# Patient Record
Sex: Female | Born: 1975 | Race: Black or African American | Hispanic: No | Marital: Single | Smoking: Never smoker
Health system: Southern US, Community
[De-identification: ages and names within clinical notes are randomized; demographics above are authoritative.]

## PROBLEM LIST (undated history)

## (undated) DIAGNOSIS — F32A Depression, unspecified: Secondary | ICD-10-CM

## (undated) DIAGNOSIS — Z9889 Other specified postprocedural states: Secondary | ICD-10-CM

## (undated) DIAGNOSIS — M2540 Effusion, unspecified joint: Secondary | ICD-10-CM

## (undated) DIAGNOSIS — F419 Anxiety disorder, unspecified: Secondary | ICD-10-CM

## (undated) DIAGNOSIS — R112 Nausea with vomiting, unspecified: Secondary | ICD-10-CM

## (undated) DIAGNOSIS — Z96652 Presence of left artificial knee joint: Secondary | ICD-10-CM

## (undated) DIAGNOSIS — C801 Malignant (primary) neoplasm, unspecified: Secondary | ICD-10-CM

## (undated) DIAGNOSIS — M1990 Unspecified osteoarthritis, unspecified site: Secondary | ICD-10-CM

## (undated) DIAGNOSIS — R519 Headache, unspecified: Secondary | ICD-10-CM

## (undated) DIAGNOSIS — Z8669 Personal history of other diseases of the nervous system and sense organs: Secondary | ICD-10-CM

## (undated) DIAGNOSIS — D508 Other iron deficiency anemias: Secondary | ICD-10-CM

## (undated) DIAGNOSIS — Z9884 Bariatric surgery status: Secondary | ICD-10-CM

## (undated) DIAGNOSIS — F329 Major depressive disorder, single episode, unspecified: Secondary | ICD-10-CM

## (undated) DIAGNOSIS — Z860100 Personal history of colon polyps, unspecified: Secondary | ICD-10-CM

## (undated) DIAGNOSIS — M2550 Pain in unspecified joint: Secondary | ICD-10-CM

## (undated) DIAGNOSIS — E119 Type 2 diabetes mellitus without complications: Secondary | ICD-10-CM

## (undated) HISTORY — PX: OTHER SURGICAL HISTORY: SHX169

## (undated) HISTORY — PX: APPENDECTOMY: SHX54

## (undated) HISTORY — PX: KNEE ARTHROSCOPY: SUR90

## (undated) HISTORY — PX: CHOLECYSTECTOMY: SHX55

## (undated) HISTORY — PX: COLONOSCOPY: SHX174

## (undated) HISTORY — PX: ARTHROSCOPIC REPAIR ACL: SUR80

## (undated) HISTORY — DX: Other iron deficiency anemias: D50.8

## (undated) HISTORY — DX: Bariatric surgery status: Z98.84

## (undated) HISTORY — PX: GASTRIC BYPASS: SHX52

## (undated) HISTORY — PX: COLECTOMY: SHX59

---

## 2010-10-23 ENCOUNTER — Other Ambulatory Visit (HOSPITAL_BASED_OUTPATIENT_CLINIC_OR_DEPARTMENT_OTHER): Payer: Self-pay | Admitting: Internal Medicine

## 2010-10-23 DIAGNOSIS — R945 Abnormal results of liver function studies: Secondary | ICD-10-CM

## 2010-10-26 ENCOUNTER — Ambulatory Visit (HOSPITAL_BASED_OUTPATIENT_CLINIC_OR_DEPARTMENT_OTHER)
Admission: RE | Admit: 2010-10-26 | Discharge: 2010-10-26 | Disposition: A | Discharge status: Home / Self Care | Payer: Private Health Insurance - Indemnity | Source: Ambulatory Visit | Attending: Internal Medicine | Admitting: Internal Medicine

## 2010-10-26 DIAGNOSIS — R945 Abnormal results of liver function studies: Secondary | ICD-10-CM

## 2010-10-26 DIAGNOSIS — R42 Dizziness and giddiness: Secondary | ICD-10-CM | POA: Insufficient documentation

## 2010-10-26 DIAGNOSIS — Z9884 Bariatric surgery status: Secondary | ICD-10-CM | POA: Insufficient documentation

## 2010-10-26 DIAGNOSIS — K8020 Calculus of gallbladder without cholecystitis without obstruction: Secondary | ICD-10-CM | POA: Insufficient documentation

## 2012-10-26 ENCOUNTER — Other Ambulatory Visit (HOSPITAL_BASED_OUTPATIENT_CLINIC_OR_DEPARTMENT_OTHER): Payer: Self-pay | Admitting: Internal Medicine

## 2012-10-26 DIAGNOSIS — Z1231 Encounter for screening mammogram for malignant neoplasm of breast: Secondary | ICD-10-CM

## 2012-10-27 ENCOUNTER — Ambulatory Visit (HOSPITAL_BASED_OUTPATIENT_CLINIC_OR_DEPARTMENT_OTHER)
Admission: RE | Admit: 2012-10-27 | Discharge: 2012-10-27 | Disposition: A | Discharge status: Home / Self Care | Payer: Private Health Insurance - Indemnity | Source: Ambulatory Visit | Attending: Internal Medicine | Admitting: Internal Medicine

## 2012-10-27 DIAGNOSIS — Z1231 Encounter for screening mammogram for malignant neoplasm of breast: Secondary | ICD-10-CM | POA: Insufficient documentation

## 2013-01-24 ENCOUNTER — Telehealth: Payer: Self-pay | Admitting: Hematology & Oncology

## 2013-01-24 NOTE — Telephone Encounter (Signed)
Left pt message to call and schedule appointment °

## 2013-01-24 NOTE — Telephone Encounter (Signed)
Pt aware of 8-25 appointment she said she couldn't come 8-18. Dr. Myna Hidalgo aware. Left message for Delice Bison to precert iron.

## 2013-01-24 NOTE — Telephone Encounter (Signed)
REFERRAL FAXED TO [facility] @ [facility].  °

## 2013-01-26 ENCOUNTER — Telehealth: Payer: Self-pay | Admitting: Hematology & Oncology

## 2013-01-26 NOTE — Telephone Encounter (Signed)
Referral forwarded to [facility] @ [facility]

## 2013-02-05 ENCOUNTER — Ambulatory Visit (HOSPITAL_BASED_OUTPATIENT_CLINIC_OR_DEPARTMENT_OTHER): Payer: 59 | Admitting: Hematology & Oncology

## 2013-02-05 ENCOUNTER — Other Ambulatory Visit (HOSPITAL_BASED_OUTPATIENT_CLINIC_OR_DEPARTMENT_OTHER): Payer: 59 | Admitting: Lab

## 2013-02-05 ENCOUNTER — Ambulatory Visit (HOSPITAL_BASED_OUTPATIENT_CLINIC_OR_DEPARTMENT_OTHER): Payer: 59

## 2013-02-05 ENCOUNTER — Ambulatory Visit: Payer: Private Health Insurance - Indemnity

## 2013-02-05 ENCOUNTER — Encounter: Payer: Self-pay | Admitting: Hematology & Oncology

## 2013-02-05 VITALS — BP 121/74 | HR 80 | Temp 98.0°F | Resp 16 | Ht 68.0 in | Wt 209.0 lb

## 2013-02-05 DIAGNOSIS — Z9884 Bariatric surgery status: Secondary | ICD-10-CM

## 2013-02-05 DIAGNOSIS — D509 Iron deficiency anemia, unspecified: Secondary | ICD-10-CM

## 2013-02-05 DIAGNOSIS — D508 Other iron deficiency anemias: Secondary | ICD-10-CM | POA: Insufficient documentation

## 2013-02-05 DIAGNOSIS — Z85038 Personal history of other malignant neoplasm of large intestine: Secondary | ICD-10-CM

## 2013-02-05 HISTORY — DX: Other iron deficiency anemias: D50.8

## 2013-02-05 HISTORY — DX: Bariatric surgery status: Z98.84

## 2013-02-05 LAB — TECHNOLOGIST REVIEW CHCC SATELLITE

## 2013-02-05 LAB — CBC WITH DIFFERENTIAL (CANCER CENTER ONLY)
BASO#: 0 10*3/uL (ref 0.0–0.2)
BASO%: 0.6 % (ref 0.0–2.0)
EOS%: 2.1 % (ref 0.0–7.0)
Eosinophils Absolute: 0.1 10*3/uL (ref 0.0–0.5)
HCT: 24 % — ABNORMAL LOW (ref 34.8–46.6)
HGB: 6.6 g/dL — CL (ref 11.6–15.9)
LYMPH#: 2 10*3/uL (ref 0.9–3.3)
LYMPH%: 38.1 % (ref 14.0–48.0)
MCH: 16.5 pg — ABNORMAL LOW (ref 26.0–34.0)
MCHC: 27.5 g/dL — ABNORMAL LOW (ref 32.0–36.0)
MCV: 60 fL — ABNORMAL LOW (ref 81–101)
MONO#: 0.4 10*3/uL (ref 0.1–0.9)
MONO%: 7.8 % (ref 0.0–13.0)
NEUT#: 2.6 10*3/uL (ref 1.5–6.5)
NEUT%: 51.4 % (ref 39.6–80.0)
Platelets: 283 10*3/uL (ref 145–400)
RBC: 3.99 10*6/uL (ref 3.70–5.32)
RDW: 21.5 % — ABNORMAL HIGH (ref 11.1–15.7)
WBC: 5.1 10*3/uL (ref 3.9–10.0)

## 2013-02-05 LAB — CHCC SATELLITE - SMEAR

## 2013-02-05 MED ORDER — FERUMOXYTOL INJECTION 510 MG/17 ML
1020.0000 mg | Freq: Once | INTRAVENOUS | Status: AC
Start: 2013-02-05 — End: 2013-02-05
  Administered 2013-02-05: 1020 mg via INTRAVENOUS
  Filled 2013-02-05: qty 34

## 2013-02-05 MED ORDER — SODIUM CHLORIDE 0.9 % IV SOLN
Freq: Once | INTRAVENOUS | Status: AC
Start: 2013-02-05 — End: 2013-02-05
  Administered 2013-02-05: 14:00:00 via INTRAVENOUS

## 2013-02-05 NOTE — Patient Instructions (Addendum)
Ferumoxytol injection What is this medicine? FERUMOXYTOL is an iron complex. Iron is used to make healthy red blood cells, which carry oxygen and nutrients throughout the body. This medicine is used to treat iron deficiency anemia in people with chronic kidney disease. This medicine may be used for other purposes; ask your health care provider or pharmacist if you have questions. What should I tell my health care provider before I take this medicine? They need to know if you have any of these conditions: -anemia not caused by low iron levels -high levels of iron in the blood -magnetic resonance imaging (MRI) test scheduled -an unusual or allergic reaction to iron, other medicines, foods, dyes, or preservatives -pregnant or trying to get pregnant -breast-feeding How should I use this medicine? This medicine is for infusion into a vein. It is given by a health care professional in a hospital or clinic setting. Talk to your pediatrician regarding the use of this medicine in children. Special care may be needed. Overdosage: If you think you've taken too much of this medicine contact a poison control center or emergency room at once. Overdosage: If you think you have taken too much of this medicine contact a poison control center or emergency room at once. NOTE: This medicine is only for you. Do not share this medicine with others. What if I miss a dose? It is important not to miss your dose. Call your doctor or health care professional if you are unable to keep an appointment. What may interact with this medicine? This medicine may interact with the following medications: -other iron products This list may not describe all possible interactions. Give your health care provider a list of all the medicines, herbs, non-prescription drugs, or dietary supplements you use. Also tell them if you smoke, drink alcohol, or use illegal drugs. Some items may interact with your medicine. What should I watch  for while using this medicine? Visit your doctor or healthcare professional regularly. Tell your doctor or healthcare professional if your symptoms do not start to get better or if they get worse. You may need blood work done while you are taking this medicine. You may need to follow a special diet. Talk to your doctor. Foods that contain iron include: whole grains/cereals, dried fruits, beans, or peas, leafy green vegetables, and organ meats (liver, kidney). What side effects may I notice from receiving this medicine? Side effects that you should report to your doctor or health care professional as soon as possible: -allergic reactions like skin rash, itching or hives, swelling of the face, lips, or tongue -breathing problems -changes in blood pressure -feeling faint or lightheaded, falls -fever or chills -flushing, sweating, or hot feelings -swelling of the ankles or feet Side effects that usually do not require medical attention (Report these to your doctor or health care professional if they continue or are bothersome.): -diarrhea -headache -nausea, vomiting -stomach pain This list may not describe all possible side effects. Call your doctor for medical advice about side effects. You may report side effects to FDA at 1-800-FDA-1088. Where should I keep my medicine? This drug is given in a hospital or clinic and will not be stored at home. NOTE: This sheet is a summary. It may not cover all possible information. If you have questions about this medicine, talk to your doctor, pharmacist, or health care provider.  2013, [employer]vier/Gold Standard. (02/21/2008 9:48:25 PM)  

## 2013-02-05 NOTE — Progress Notes (Signed)
This office note has been dictated.

## 2013-02-06 NOTE — Progress Notes (Signed)
CC:   Brandi Lutz, M.D.  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  HISTORY OF PRESENT ILLNESS:  Brandi Lutz is a very charming 37 year old African American female.  [name] actually from [place].  She only lived in [place] for 5 years.  She then moved to [place].  She went to [place] state.  She then moved down to [place] and then eventually up to [place].  She works for [employer].  She had a gastric bypass 5 years ago.  She apparently weighed over 370 pounds.  She has lost quite a bit of weight.  [name] incredibly diligent with making sure that she keeps the weight off.  [name] getting B12 injections monthly.  She was noted to have anemia.  Recently, she had lab work done which showed a white cell count of 4.5, hemoglobin 6.4, hematocrit 23.4, platelet count 458.  MCV was 59.  She had normal electrolytes.  She had a homocystine level that was drawn, which was 11.6.  She was kindly referred to the [facility] for IV iron.  She feels tired.  She chews ice quite a bit.  [name] still working however, it has been tougher for her to get through work because of her fatigue.  He says that her muscles wear out pretty easily.  She has had no bleeding.  There has been no melena or bright red blood per rectum.  She does have her monthly cycles but they do not seem to be heavy.  Of note, she has had her gallbladder taken out.  Again, we were asked to see her to see about IV iron to help improve her anemia.  PAST MEDICAL HISTORY: 1. Colon cancer.  There is a strong family history of colon cancer.     She had stage II colon cancer that was resected, I think, back in     2007.  She did not require any adjuvant chemotherapy. 2. Morbid obesity, status post gastric bypass. 3. Cholecystectomy.  ALLERGIES:  None.  MEDICATIONS: 1. Oral iron 1 p.o. b.i.d. 2. Klonopin 0.5 mg p.o. daily. 3. Vitamin D 1 p.o. b.i.d.  FAMILY HISTORY:  Remarkable  for history of colon cancer on her mother's side of the family.  I think 3 family members have colon cancer.  REVIEW OF SYSTEMS:  As stated in the history of present illness.  No additional findings are noted on a 12-system review.  PHYSICAL EXAMINATION:  General:  This is a well-developed, well- nourished, African American female in no obvious distress.  Vital signs: Temperature of 98, pulse 80, respiratory rate 16, blood pressure 121/74. Weight is 209 pounds.  Head and neck:  Normocephalic, atraumatic skull. There are no ocular or oral lesions.  There are no palpable cervical or supraclavicular lymph nodes.  Lungs:  Clear to percussion and auscultation bilaterally.  Cardiac:  Regular rate and rhythm with a normal S1, S2.  There are no murmurs, rubs or bruits.  Abdomen:  Soft. She has good bowel sounds.  There is no fluid wave.  She has well-healed laparoscopy scar from her colon cancer surgery.  There is no palpable hepatosplenomegaly.  Back:  No tenderness over the spine, ribs, or hips. Extremities:  Show no clubbing, cyanosis or edema.  She has good range motion of her joints.  Skin:  Shows no rashes, ecchymoses or petechia. Neurological:  Shows no focal neurological deficits.  LABORATORY STUDIES:  White cell count is 5.1, hemoglobin 6.6, hematocrit 24, platelet  count 283,000.  MCV is 60.  Peripheral smear shows markedly microcytic and hypochromic population of red blood cells.  There are no nucleated red blood cells.  I see no target cells.  There is no rouleaux formation.  I see no schistocytes. White cells appear normal in morphology and maturation.  There are no immature myeloid or lymphoid forms.  There are no hypersegmented polys. There are no atypical lymphocytes.  There are no blasts.  Platelets are adequate in number and size.  IMPRESSION:  Brandi Lutz is a very charming 37 year old [place] female who had gastric bypass.  She also had colon cancer.  She now is  iron deficient.  We did go ahead and give her a dose of IV iron today.  I am very confident that her iron stores are pretty much depleted and that she likely will need more than 1 dose of iron.  Her blood smear is highly consistent with iron deficiency.  Clinical symptoms appear to be consistent with iron deficiency.  I would like to see her back in about 4 weeks or so.  By then we should be seeing a nice rise in her MCV and also in her hemoglobin.  I spent a good hour or so with Brandi Lutz.  [name] very, very nice.  We certainly had a nice time talking.    ______________________________ Josph Macho, M.D. PRE/MEDQ  D:  02/05/2013  T:  02/06/2013  Job:  [id number]

## 2013-03-26 ENCOUNTER — Telehealth: Payer: Self-pay | Admitting: Hematology & Oncology

## 2013-03-26 NOTE — Telephone Encounter (Signed)
Pt called today to schedule another iron infusion, and advised Dr. Bea Laura said she needs three.

## 2013-04-09 ENCOUNTER — Encounter: Payer: Self-pay | Admitting: *Deleted

## 2013-04-12 ENCOUNTER — Ambulatory Visit (HOSPITAL_BASED_OUTPATIENT_CLINIC_OR_DEPARTMENT_OTHER): Payer: 59

## 2013-04-12 VITALS — BP 121/61 | HR 89 | Temp 98.2°F

## 2013-04-12 DIAGNOSIS — D508 Other iron deficiency anemias: Secondary | ICD-10-CM

## 2013-04-12 DIAGNOSIS — D509 Iron deficiency anemia, unspecified: Secondary | ICD-10-CM

## 2013-04-12 MED ORDER — SODIUM CHLORIDE 0.9 % IV SOLN
1020.0000 mg | Freq: Once | INTRAVENOUS | Status: AC
  Administered 2013-04-12: 1020 mg via INTRAVENOUS
  Filled 2013-04-12: qty 34

## 2013-04-12 MED ORDER — SODIUM CHLORIDE 0.9 % IV SOLN
Freq: Once | INTRAVENOUS | Status: AC
  Administered 2013-04-12: 13:00:00 via INTRAVENOUS

## 2013-04-12 NOTE — Patient Instructions (Signed)

## 2013-04-19 ENCOUNTER — Encounter: Payer: Self-pay | Admitting: Hematology & Oncology

## 2013-11-13 ENCOUNTER — Ambulatory Visit: Payer: 59 | Attending: Orthopaedic Surgery | Admitting: Rehabilitation

## 2013-11-13 DIAGNOSIS — Z9889 Other specified postprocedural states: Secondary | ICD-10-CM | POA: Insufficient documentation

## 2013-11-13 DIAGNOSIS — M25569 Pain in unspecified knee: Secondary | ICD-10-CM | POA: Diagnosis not present

## 2013-11-13 DIAGNOSIS — IMO0001 Reserved for inherently not codable concepts without codable children: Secondary | ICD-10-CM | POA: Insufficient documentation

## 2013-11-13 DIAGNOSIS — M898X9 Other specified disorders of bone, unspecified site: Secondary | ICD-10-CM | POA: Insufficient documentation

## 2013-11-13 DIAGNOSIS — M2350 Chronic instability of knee, unspecified knee: Secondary | ICD-10-CM | POA: Diagnosis not present

## 2013-11-13 DIAGNOSIS — M1710 Unilateral primary osteoarthritis, unspecified knee: Secondary | ICD-10-CM | POA: Insufficient documentation

## 2013-11-16 ENCOUNTER — Ambulatory Visit: Payer: 59 | Admitting: Rehabilitation

## 2013-11-16 DIAGNOSIS — IMO0001 Reserved for inherently not codable concepts without codable children: Secondary | ICD-10-CM | POA: Diagnosis not present

## 2013-11-20 ENCOUNTER — Ambulatory Visit: Payer: 59 | Admitting: Rehabilitation

## 2013-11-21 ENCOUNTER — Ambulatory Visit: Payer: 59 | Admitting: Rehabilitation

## 2013-11-21 DIAGNOSIS — IMO0001 Reserved for inherently not codable concepts without codable children: Secondary | ICD-10-CM | POA: Diagnosis not present

## 2013-11-26 ENCOUNTER — Ambulatory Visit: Payer: 59 | Admitting: Rehabilitation

## 2013-11-26 ENCOUNTER — Other Ambulatory Visit: Payer: Self-pay | Admitting: Orthopaedic Surgery

## 2013-11-26 DIAGNOSIS — M25562 Pain in left knee: Secondary | ICD-10-CM

## 2013-11-29 ENCOUNTER — Ambulatory Visit: Payer: 59 | Admitting: Rehabilitation

## 2013-11-29 ENCOUNTER — Ambulatory Visit
Admission: RE | Admit: 2013-11-29 | Discharge: 2013-11-29 | Disposition: A | Discharge status: Home / Self Care | Payer: 59 | Source: Ambulatory Visit | Attending: Orthopaedic Surgery | Admitting: Orthopaedic Surgery

## 2013-11-29 DIAGNOSIS — M25562 Pain in left knee: Secondary | ICD-10-CM

## 2013-12-03 ENCOUNTER — Ambulatory Visit: Payer: 59 | Admitting: Rehabilitation

## 2013-12-06 ENCOUNTER — Ambulatory Visit: Payer: 59 | Admitting: Rehabilitation

## 2013-12-10 ENCOUNTER — Ambulatory Visit: Payer: 59 | Admitting: Rehabilitation

## 2013-12-13 ENCOUNTER — Ambulatory Visit: Payer: 59 | Admitting: Rehabilitation

## 2014-01-31 ENCOUNTER — Other Ambulatory Visit (HOSPITAL_BASED_OUTPATIENT_CLINIC_OR_DEPARTMENT_OTHER): Payer: Self-pay | Admitting: Internal Medicine

## 2014-01-31 DIAGNOSIS — Z1231 Encounter for screening mammogram for malignant neoplasm of breast: Secondary | ICD-10-CM

## 2014-02-13 ENCOUNTER — Ambulatory Visit (HOSPITAL_BASED_OUTPATIENT_CLINIC_OR_DEPARTMENT_OTHER)
Admission: RE | Admit: 2014-02-13 | Discharge: 2014-02-13 | Disposition: A | Discharge status: Home / Self Care | Payer: 59 | Source: Ambulatory Visit | Attending: Internal Medicine | Admitting: Internal Medicine

## 2014-02-13 DIAGNOSIS — Z1231 Encounter for screening mammogram for malignant neoplasm of breast: Secondary | ICD-10-CM | POA: Diagnosis not present

## 2014-02-13 DIAGNOSIS — R928 Other abnormal and inconclusive findings on diagnostic imaging of breast: Secondary | ICD-10-CM | POA: Diagnosis not present

## 2014-02-15 ENCOUNTER — Other Ambulatory Visit: Payer: Self-pay | Admitting: Internal Medicine

## 2014-02-15 DIAGNOSIS — R928 Other abnormal and inconclusive findings on diagnostic imaging of breast: Secondary | ICD-10-CM

## 2014-02-25 ENCOUNTER — Ambulatory Visit
Admission: RE | Admit: 2014-02-25 | Discharge: 2014-02-25 | Disposition: A | Discharge status: Home / Self Care | Payer: 59 | Source: Ambulatory Visit | Attending: Internal Medicine | Admitting: Internal Medicine

## 2014-02-25 ENCOUNTER — Encounter (INDEPENDENT_AMBULATORY_CARE_PROVIDER_SITE_OTHER): Payer: Self-pay

## 2014-02-25 DIAGNOSIS — R928 Other abnormal and inconclusive findings on diagnostic imaging of breast: Secondary | ICD-10-CM

## 2014-04-02 ENCOUNTER — Ambulatory Visit: Payer: 59 | Attending: Orthopaedic Surgery

## 2014-04-02 DIAGNOSIS — Z5189 Encounter for other specified aftercare: Secondary | ICD-10-CM | POA: Diagnosis not present

## 2014-04-02 DIAGNOSIS — M25569 Pain in unspecified knee: Secondary | ICD-10-CM | POA: Insufficient documentation

## 2014-04-03 ENCOUNTER — Ambulatory Visit: Payer: 59 | Admitting: Rehabilitation

## 2014-04-09 ENCOUNTER — Ambulatory Visit: Payer: 59

## 2014-04-09 DIAGNOSIS — Z5189 Encounter for other specified aftercare: Secondary | ICD-10-CM | POA: Diagnosis not present

## 2014-04-11 ENCOUNTER — Ambulatory Visit: Payer: 59 | Admitting: Rehabilitation

## 2014-04-11 DIAGNOSIS — Z5189 Encounter for other specified aftercare: Secondary | ICD-10-CM | POA: Diagnosis not present

## 2014-04-16 ENCOUNTER — Ambulatory Visit: Payer: 59 | Attending: Orthopaedic Surgery | Admitting: Rehabilitation

## 2014-04-16 DIAGNOSIS — Z5189 Encounter for other specified aftercare: Secondary | ICD-10-CM | POA: Insufficient documentation

## 2014-04-16 DIAGNOSIS — M25569 Pain in unspecified knee: Secondary | ICD-10-CM | POA: Diagnosis not present

## 2014-04-18 ENCOUNTER — Ambulatory Visit: Payer: 59

## 2014-04-18 DIAGNOSIS — Z5189 Encounter for other specified aftercare: Secondary | ICD-10-CM | POA: Diagnosis not present

## 2014-04-23 ENCOUNTER — Ambulatory Visit: Payer: 59 | Admitting: Rehabilitation

## 2014-04-23 DIAGNOSIS — Z5189 Encounter for other specified aftercare: Secondary | ICD-10-CM | POA: Diagnosis not present

## 2014-04-25 ENCOUNTER — Ambulatory Visit: Payer: 59

## 2014-04-30 ENCOUNTER — Ambulatory Visit: Payer: 59 | Admitting: Rehabilitation

## 2014-04-30 DIAGNOSIS — Z5189 Encounter for other specified aftercare: Secondary | ICD-10-CM | POA: Diagnosis not present

## 2014-05-02 ENCOUNTER — Ambulatory Visit: Payer: 59 | Admitting: Rehabilitation

## 2014-05-07 ENCOUNTER — Ambulatory Visit: Payer: 59 | Admitting: Rehabilitation

## 2014-05-07 DIAGNOSIS — Z5189 Encounter for other specified aftercare: Secondary | ICD-10-CM | POA: Diagnosis not present

## 2014-05-08 ENCOUNTER — Ambulatory Visit: Payer: 59 | Admitting: Rehabilitation

## 2014-05-08 DIAGNOSIS — Z5189 Encounter for other specified aftercare: Secondary | ICD-10-CM | POA: Diagnosis not present

## 2014-05-13 ENCOUNTER — Ambulatory Visit: Payer: 59 | Admitting: Rehabilitation

## 2014-05-16 ENCOUNTER — Ambulatory Visit: Payer: 59 | Admitting: Rehabilitation

## 2014-05-20 ENCOUNTER — Ambulatory Visit: Payer: 59 | Attending: Orthopaedic Surgery | Admitting: Rehabilitation

## 2014-05-20 DIAGNOSIS — M25569 Pain in unspecified knee: Secondary | ICD-10-CM | POA: Insufficient documentation

## 2014-05-20 DIAGNOSIS — Z5189 Encounter for other specified aftercare: Secondary | ICD-10-CM | POA: Insufficient documentation

## 2014-05-23 ENCOUNTER — Encounter: Payer: 59 | Admitting: Rehabilitation

## 2014-05-27 ENCOUNTER — Ambulatory Visit: Payer: 59 | Admitting: Rehabilitation

## 2014-05-29 ENCOUNTER — Ambulatory Visit: Payer: 59 | Admitting: Rehabilitation

## 2014-07-16 ENCOUNTER — Inpatient Hospital Stay (HOSPITAL_COMMUNITY): Admission: RE | Admit: 2014-07-16 | Payer: Self-pay | Source: Ambulatory Visit

## 2014-07-22 ENCOUNTER — Encounter (HOSPITAL_COMMUNITY): Payer: Self-pay

## 2014-07-22 ENCOUNTER — Encounter (HOSPITAL_COMMUNITY)
Admission: RE | Admit: 2014-07-22 | Discharge: 2014-07-22 | Disposition: A | Discharge status: Home / Self Care | Payer: 59 | Source: Ambulatory Visit | Attending: Orthopaedic Surgery | Admitting: Orthopaedic Surgery

## 2014-07-22 DIAGNOSIS — M179 Osteoarthritis of knee, unspecified: Secondary | ICD-10-CM | POA: Diagnosis not present

## 2014-07-22 DIAGNOSIS — Z01812 Encounter for preprocedural laboratory examination: Secondary | ICD-10-CM | POA: Insufficient documentation

## 2014-07-22 DIAGNOSIS — Z0183 Encounter for blood typing: Secondary | ICD-10-CM | POA: Diagnosis not present

## 2014-07-22 HISTORY — DX: Personal history of colon polyps, unspecified: Z86.0100

## 2014-07-22 HISTORY — DX: Unspecified osteoarthritis, unspecified site: M19.90

## 2014-07-22 HISTORY — DX: Personal history of colonic polyps: Z86.010

## 2014-07-22 HISTORY — DX: Other specified postprocedural states: Z98.890

## 2014-07-22 HISTORY — DX: Major depressive disorder, single episode, unspecified: F32.9

## 2014-07-22 HISTORY — DX: Anxiety disorder, unspecified: F41.9

## 2014-07-22 HISTORY — DX: Personal history of other diseases of the nervous system and sense organs: Z86.69

## 2014-07-22 HISTORY — DX: Nausea with vomiting, unspecified: R11.2

## 2014-07-22 HISTORY — DX: Depression, unspecified: F32.A

## 2014-07-22 HISTORY — DX: Pain in unspecified joint: M25.50

## 2014-07-22 HISTORY — DX: Effusion, unspecified joint: M25.40

## 2014-07-22 HISTORY — DX: Malignant (primary) neoplasm, unspecified: C80.1

## 2014-07-22 LAB — PROTIME-INR
INR: 1.07 (ref 0.00–1.49)
Prothrombin Time: 14.1 seconds (ref 11.6–15.2)

## 2014-07-22 LAB — BASIC METABOLIC PANEL
Anion gap: 5 (ref 5–15)
BUN: 10 mg/dL (ref 6–23)
CO2: 25 mmol/L (ref 19–32)
Calcium: 8.6 mg/dL (ref 8.4–10.5)
Chloride: 108 mmol/L (ref 96–112)
Creatinine, Ser: 0.92 mg/dL (ref 0.50–1.10)
GFR calc Af Amer: 89 mL/min — ABNORMAL LOW (ref >90)
GFR calc non Af Amer: 77 mL/min — ABNORMAL LOW (ref >90)
Glucose, Bld: 73 mg/dL (ref 70–99)
Potassium: 3.6 mmol/L (ref 3.5–5.1)
Sodium: 138 mmol/L (ref 135–145)

## 2014-07-22 LAB — APTT: aPTT: 31 seconds (ref 24–37)

## 2014-07-22 LAB — HCG, SERUM, QUALITATIVE: Preg, Serum: NEGATIVE

## 2014-07-22 LAB — CBC
HCT: 32.7 % — ABNORMAL LOW (ref 36.0–46.0)
Hemoglobin: 10.6 g/dL — ABNORMAL LOW (ref 12.0–15.0)
MCH: 23.5 pg — ABNORMAL LOW (ref 26.0–34.0)
MCHC: 32.4 g/dL (ref 30.0–36.0)
MCV: 72.3 fL — ABNORMAL LOW (ref 78.0–100.0)
Platelets: 274 10*3/uL (ref 150–400)
RBC: 4.52 MIL/uL (ref 3.87–5.11)
RDW: 14.7 % (ref 11.5–15.5)
WBC: 4.9 10*3/uL (ref 4.0–10.5)

## 2014-07-22 LAB — SURGICAL PCR SCREEN
MRSA, PCR: NEGATIVE
Staphylococcus aureus: NEGATIVE

## 2014-07-22 LAB — ABO/RH: ABO/RH(D): O POS

## 2014-07-22 LAB — TYPE AND SCREEN
ABO/RH(D): O POS
Antibody Screen: NEGATIVE

## 2014-07-22 NOTE — Pre-Procedure Instructions (Signed)
Brandi Lutz  07/22/2014   Your procedure is scheduled on:  Mon, Feb 22 @ 12:30 PM  Report to [facility]  at 10:30 AM.  Call this number if you have problems the morning of surgery: [phone]   Remember:   Do not eat food or drink liquids after midnight.   Take these medicines the morning of surgery with A SIP OF WATER: Wellbutrin(Bupropion) and Zoloft(Sertraline)               No Goody's,BC's,Aleve,Aspirin,Ibuprofen,Fish Oil,or any Herbal Medications   Do not wear jewelry, make-up or nail polish.  Do not wear lotions, powders, or perfumes. You may wear deodorant.  Do not shave 48 hours prior to surgery.   Do not bring valuables to the hospital.  [facility] is not responsible                  for any belongings or valuables.               Contacts, dentures or bridgework may not be worn into surgery.  Leave suitcase in the car. After surgery it may be brought to your room.  For patients admitted to the hospital, discharge time is determined by your                treatment team.                   Special Instructions:  [place] - Preparing for Surgery  Before surgery, you can play an important role.  Because skin is not sterile, your skin needs to be as free of germs as possible.  You can reduce the number of germs on you skin by washing with CHG (chlorahexidine gluconate) soap before surgery.  CHG is an antiseptic cleaner which kills germs and bonds with the skin to continue killing germs even after washing.  Please DO NOT use if you have an allergy to CHG or antibacterial soaps.  If your skin becomes reddened/irritated stop using the CHG and inform your nurse when you arrive at Short Stay.  Do not shave (including legs and underarms) for at least 48 hours prior to the first CHG shower.  You may shave your face.  Please follow these instructions carefully:   1.  Shower with CHG Soap the night before surgery and the                                morning of  Surgery.  2.  If you choose to wash your hair, wash your hair first as usual with your       normal shampoo.  3.  After you shampoo, rinse your hair and body thoroughly to remove the                      Shampoo.  4.  Use CHG as you would any other liquid soap.  You can apply chg directly       to the skin and wash gently with scrungie or a clean washcloth.  5.  Apply the CHG Soap to your body ONLY FROM THE NECK DOWN.        Do not use on open wounds or open sores.  Avoid contact with your eyes,       ears, mouth and genitals (private parts).  Wash genitals (private parts)       with  your normal soap.  6.  Wash thoroughly, paying special attention to the area where your surgery        will be performed.  7.  Thoroughly rinse your body with warm water from the neck down.  8.  DO NOT shower/wash with your normal soap after using and rinsing off       the CHG Soap.  9.  Pat yourself dry with a clean towel.            10.  Wear clean pajamas.            11.  Place clean sheets on your bed the night of your first shower and do not        sleep with pets.  Day of Surgery  Do not apply any lotions/deoderants the morning of surgery.  Please wear clean clothes to the hospital/surgery center.    Please read over the following fact sheets that you were given: Pain Booklet, Coughing and Deep Breathing, Blood Transfusion Information, MRSA Information and Surgical Site Infection Prevention

## 2014-07-22 NOTE — Progress Notes (Signed)
Was a diabetic prior to 2009-was only on Metformin;since gastric bypass no diabetes

## 2014-07-22 NOTE — Progress Notes (Signed)
Pt doesn't have a cardiologist  Echo done in 2009  Denies ever having a stress test/heart cath  Denies EKG or CXR in past yr  Medical Md is Dr.George Osei-Bonsu

## 2014-07-23 ENCOUNTER — Other Ambulatory Visit (HOSPITAL_COMMUNITY): Payer: Self-pay | Admitting: Orthopaedic Surgery

## 2014-07-23 ENCOUNTER — Other Ambulatory Visit: Payer: Self-pay | Admitting: Family

## 2014-08-04 MED ORDER — CEFAZOLIN SODIUM-DEXTROSE 2-3 GM-% IV SOLR
2.0000 g | INTRAVENOUS | Status: DC
Start: 2014-08-04 — End: 2014-08-05

## 2014-08-04 MED ORDER — CHLORHEXIDINE GLUCONATE 4 % EX LIQD
60.0000 mL | Freq: Once | CUTANEOUS | Status: DC
  Filled 2014-08-04: qty 60

## 2014-08-04 MED ORDER — BUPIVACAINE LIPOSOME 1.3 % IJ SUSP
20.0000 mL | Freq: Once | INTRAMUSCULAR | Status: DC
  Filled 2014-08-04: qty 20

## 2014-08-04 NOTE — Anesthesia Preprocedure Evaluation (Addendum)
Anesthesia Evaluation   Patient awake    History of Anesthesia Complications (+) PONV  Airway Mallampati: II   Neck ROM: Full    Dental  (+) Teeth Intact, Dental Advisory Given   Pulmonary neg pulmonary ROS,  breath sounds clear to auscultation        Cardiovascular Rhythm:Regular     Neuro/Psych Anxiety Depression negative neurological ROS     GI/Hepatic negative GI ROS, Neg liver ROS,   Endo/Other  negative endocrine ROS  Renal/GU negative Renal ROS     Musculoskeletal   Abdominal (+) + obese,   Peds  Hematology  (+) anemia , 10/33   Anesthesia Other Findings SP RYGB for obesity  Reproductive/Obstetrics                            Anesthesia Physical Anesthesia Plan  ASA: II  Anesthesia Plan: General   Post-op Pain Management: MAC Combined w/ Regional for Post-op pain   Induction: Intravenous  Airway Management Planned: Oral ETT  Additional Equipment:   Intra-op Plan:   Post-operative Plan: Extubation in OR  Informed Consent: I have reviewed the patients History and Physical, chart, labs and discussed the procedure including the risks, benefits and alternatives for the proposed anesthesia with the patient or authorized representative who has indicated his/her understanding and acceptance.     Plan Discussed with:   Anesthesia Plan Comments:         Anesthesia Quick Evaluation

## 2014-08-05 ENCOUNTER — Encounter (HOSPITAL_COMMUNITY): Payer: Self-pay | Admitting: *Deleted

## 2014-08-05 ENCOUNTER — Inpatient Hospital Stay (HOSPITAL_COMMUNITY): Payer: 59 | Admitting: Anesthesiology

## 2014-08-05 ENCOUNTER — Encounter (HOSPITAL_COMMUNITY)
Admission: RE | Disposition: A | Discharge status: Home / Self Care | Payer: Self-pay | Source: Ambulatory Visit | Attending: Orthopaedic Surgery

## 2014-08-05 ENCOUNTER — Observation Stay (HOSPITAL_COMMUNITY)
Admission: RE | Admit: 2014-08-05 | Discharge: 2014-08-06 | Disposition: A | Discharge status: Home / Self Care | Payer: 59 | Source: Ambulatory Visit | Attending: Orthopaedic Surgery | Admitting: Orthopaedic Surgery

## 2014-08-05 ENCOUNTER — Observation Stay (HOSPITAL_COMMUNITY): Payer: 59

## 2014-08-05 DIAGNOSIS — Z6829 Body mass index (BMI) 29.0-29.9, adult: Secondary | ICD-10-CM | POA: Diagnosis not present

## 2014-08-05 DIAGNOSIS — F329 Major depressive disorder, single episode, unspecified: Secondary | ICD-10-CM | POA: Diagnosis not present

## 2014-08-05 DIAGNOSIS — E669 Obesity, unspecified: Secondary | ICD-10-CM | POA: Insufficient documentation

## 2014-08-05 DIAGNOSIS — F419 Anxiety disorder, unspecified: Secondary | ICD-10-CM | POA: Diagnosis not present

## 2014-08-05 DIAGNOSIS — G43909 Migraine, unspecified, not intractable, without status migrainosus: Secondary | ICD-10-CM | POA: Insufficient documentation

## 2014-08-05 DIAGNOSIS — M1712 Unilateral primary osteoarthritis, left knee: Principal | ICD-10-CM | POA: Diagnosis present

## 2014-08-05 DIAGNOSIS — Z9049 Acquired absence of other specified parts of digestive tract: Secondary | ICD-10-CM | POA: Insufficient documentation

## 2014-08-05 DIAGNOSIS — Z9884 Bariatric surgery status: Secondary | ICD-10-CM | POA: Insufficient documentation

## 2014-08-05 DIAGNOSIS — Z86010 Personal history of colonic polyps: Secondary | ICD-10-CM | POA: Diagnosis not present

## 2014-08-05 DIAGNOSIS — Z85038 Personal history of other malignant neoplasm of large intestine: Secondary | ICD-10-CM | POA: Diagnosis not present

## 2014-08-05 DIAGNOSIS — D649 Anemia, unspecified: Secondary | ICD-10-CM | POA: Diagnosis not present

## 2014-08-05 DIAGNOSIS — Z96652 Presence of left artificial knee joint: Secondary | ICD-10-CM

## 2014-08-05 HISTORY — DX: Presence of left artificial knee joint: Z96.652

## 2014-08-05 HISTORY — PX: PARTIAL KNEE ARTHROPLASTY: SHX2174

## 2014-08-05 LAB — CBC WITH DIFFERENTIAL/PLATELET
Basophils Absolute: 0 10*3/uL (ref 0.0–0.1)
Basophils Relative: 1 % (ref 0–1)
Eosinophils Absolute: 0.1 10*3/uL (ref 0.0–0.7)
Eosinophils Relative: 2 % (ref 0–5)
HCT: 31.8 % — ABNORMAL LOW (ref 36.0–46.0)
Hemoglobin: 10.3 g/dL — ABNORMAL LOW (ref 12.0–15.0)
Lymphocytes Relative: 38 % (ref 12–46)
Lymphs Abs: 1.5 10*3/uL (ref 0.7–4.0)
MCH: 23.4 pg — ABNORMAL LOW (ref 26.0–34.0)
MCHC: 32.4 g/dL (ref 30.0–36.0)
MCV: 72.1 fL — ABNORMAL LOW (ref 78.0–100.0)
Monocytes Absolute: 0.3 10*3/uL (ref 0.1–1.0)
Monocytes Relative: 7 % (ref 3–12)
Neutro Abs: 2.1 10*3/uL (ref 1.7–7.7)
Neutrophils Relative %: 52 % (ref 43–77)
Platelets: 297 10*3/uL (ref 150–400)
RBC: 4.41 MIL/uL (ref 3.87–5.11)
RDW: 15.1 % (ref 11.5–15.5)
WBC: 4 10*3/uL (ref 4.0–10.5)

## 2014-08-05 LAB — HEPATIC FUNCTION PANEL
ALT: 25 U/L (ref 0–35)
AST: 28 U/L (ref 0–37)
Albumin: 3.7 g/dL (ref 3.5–5.2)
Alkaline Phosphatase: 77 U/L (ref 39–117)
Bilirubin, Direct: 0.1 mg/dL (ref 0.0–0.5)
Indirect Bilirubin: 0.5 mg/dL (ref 0.3–0.9)
Total Bilirubin: 0.6 mg/dL (ref 0.3–1.2)
Total Protein: 6.2 g/dL (ref 6.0–8.3)

## 2014-08-05 LAB — SEDIMENTATION RATE: Sed Rate: 10 mm/hr (ref 0–22)

## 2014-08-05 LAB — TYPE AND SCREEN
ABO/RH(D): O POS
Antibody Screen: NEGATIVE

## 2014-08-05 LAB — C-REACTIVE PROTEIN: CRP: <0.5 mg/dL — ABNORMAL LOW (ref <0.60)

## 2014-08-05 SURGERY — ARTHROPLASTY, KNEE, UNICOMPARTMENTAL
Anesthesia: General | Site: Knee | Laterality: Left

## 2014-08-05 MED ORDER — MORPHINE SULFATE 2 MG/ML IJ SOLN: 1.0000 mg | INTRAMUSCULAR | Status: DC | PRN

## 2014-08-05 MED ORDER — DIPHENHYDRAMINE HCL 12.5 MG/5ML PO ELIX: 25.0000 mg | ORAL_SOLUTION | ORAL | Status: DC | PRN

## 2014-08-05 MED ORDER — ONDANSETRON HCL 4 MG/2ML IJ SOLN: 4.0000 mg | Freq: Four times a day (QID) | INTRAMUSCULAR | Status: DC | PRN

## 2014-08-05 MED ORDER — WHITE PETROLATUM GEL
Status: AC
  Administered 2014-08-05: 17:00:00
  Filled 2014-08-05: qty 1

## 2014-08-05 MED ORDER — FENTANYL CITRATE 0.05 MG/ML IJ SOLN
INTRAMUSCULAR | Status: AC
Start: 2014-08-05 — End: 2014-08-05
  Filled 2014-08-05: qty 5

## 2014-08-05 MED ORDER — BUPIVACAINE-EPINEPHRINE (PF) 0.5% -1:200000 IJ SOLN
INTRAMUSCULAR | Status: DC | PRN
  Administered 2014-08-05: 30 mL via PERINEURAL

## 2014-08-05 MED ORDER — OXYCODONE HCL 5 MG PO TABS
5.0000 mg | ORAL_TABLET | ORAL | Status: DC | PRN
  Administered 2014-08-05: 5 mg via ORAL
  Administered 2014-08-05 – 2014-08-06 (×2): 10 mg via ORAL
  Administered 2014-08-06 (×2): 5 mg via ORAL
  Filled 2014-08-05: qty 1
  Filled 2014-08-05: qty 2
  Filled 2014-08-05: qty 1
  Filled 2014-08-05: qty 2
  Filled 2014-08-05: qty 1

## 2014-08-05 MED ORDER — CELECOXIB 200 MG PO CAPS
200.0000 mg | ORAL_CAPSULE | Freq: Two times a day (BID) | ORAL | Status: DC
  Administered 2014-08-05 – 2014-08-06 (×2): 200 mg via ORAL
  Filled 2014-08-05 (×2): qty 1

## 2014-08-05 MED ORDER — METHOCARBAMOL 500 MG PO TABS
500.0000 mg | ORAL_TABLET | Freq: Four times a day (QID) | ORAL | Status: DC | PRN
Start: 2014-08-05 — End: 2014-08-06
  Administered 2014-08-05: 500 mg via ORAL
  Filled 2014-08-05: qty 1

## 2014-08-05 MED ORDER — DEXAMETHASONE SODIUM PHOSPHATE 4 MG/ML IJ SOLN
INTRAMUSCULAR | Status: DC | PRN
  Administered 2014-08-05: 8 mg via INTRAVENOUS

## 2014-08-05 MED ORDER — ALUM & MAG HYDROXIDE-SIMETH 200-200-20 MG/5ML PO SUSP: 30.0000 mL | ORAL | Status: DC | PRN

## 2014-08-05 MED ORDER — MIDAZOLAM HCL 5 MG/5ML IJ SOLN
INTRAMUSCULAR | Status: DC | PRN
  Administered 2014-08-05: 2 mg via INTRAVENOUS

## 2014-08-05 MED ORDER — LIDOCAINE HCL (CARDIAC) 20 MG/ML IV SOLN
INTRAVENOUS | Status: AC
  Filled 2014-08-05: qty 5

## 2014-08-05 MED ORDER — MIDAZOLAM HCL 2 MG/2ML IJ SOLN
1.0000 mg | INTRAMUSCULAR | Status: DC | PRN
  Administered 2014-08-05: 1 mg via INTRAVENOUS

## 2014-08-05 MED ORDER — ACETAMINOPHEN 500 MG PO TABS
1000.0000 mg | ORAL_TABLET | Freq: Four times a day (QID) | ORAL | Status: DC
  Administered 2014-08-05 – 2014-08-06 (×3): 1000 mg via ORAL
  Filled 2014-08-05 (×3): qty 2

## 2014-08-05 MED ORDER — VITAMIN D (ERGOCALCIFEROL) 1.25 MG (50000 UNIT) PO CAPS
50000.0000 [IU] | ORAL_CAPSULE | ORAL | Status: DC
  Filled 2014-08-05: qty 1

## 2014-08-05 MED ORDER — LACTATED RINGERS IV SOLN
INTRAVENOUS | Status: DC | PRN
  Administered 2014-08-05 (×2): via INTRAVENOUS

## 2014-08-05 MED ORDER — ASPIRIN EC 325 MG PO TBEC: 325.0000 mg | DELAYED_RELEASE_TABLET | Freq: Two times a day (BID) | ORAL | Status: DC

## 2014-08-05 MED ORDER — MIDAZOLAM HCL 2 MG/2ML IJ SOLN
INTRAMUSCULAR | Status: AC
  Administered 2014-08-05: 1 mg via INTRAVENOUS
  Filled 2014-08-05: qty 2

## 2014-08-05 MED ORDER — PROPOFOL 10 MG/ML IV BOLUS
INTRAVENOUS | Status: AC
  Filled 2014-08-05: qty 20

## 2014-08-05 MED ORDER — ACETAMINOPHEN 325 MG PO TABS: 650.0000 mg | ORAL_TABLET | Freq: Four times a day (QID) | ORAL | Status: DC | PRN

## 2014-08-05 MED ORDER — ACETAMINOPHEN 650 MG RE SUPP: 650.0000 mg | Freq: Four times a day (QID) | RECTAL | Status: DC | PRN

## 2014-08-05 MED ORDER — MAGNESIUM CITRATE PO SOLN: 1.0000 | Freq: Once | ORAL | Status: AC | PRN

## 2014-08-05 MED ORDER — LIDOCAINE HCL (CARDIAC) 10 MG/ML IV SOLN
INTRAVENOUS | Status: DC | PRN
  Administered 2014-08-05: 20 mg via INTRAVENOUS

## 2014-08-05 MED ORDER — FENTANYL CITRATE 0.05 MG/ML IJ SOLN
50.0000 ug | INTRAMUSCULAR | Status: DC | PRN
  Administered 2014-08-05: 50 ug via INTRAVENOUS

## 2014-08-05 MED ORDER — MENTHOL 3 MG MT LOZG: 1.0000 | LOZENGE | OROMUCOSAL | Status: DC | PRN

## 2014-08-05 MED ORDER — KETOROLAC TROMETHAMINE 30 MG/ML IJ SOLN
30.0000 mg | Freq: Four times a day (QID) | INTRAMUSCULAR | Status: DC | PRN
  Administered 2014-08-05: 30 mg via INTRAVENOUS

## 2014-08-05 MED ORDER — PHENYLEPHRINE HCL 10 MG/ML IJ SOLN
10.0000 mg | INTRAMUSCULAR | Status: DC | PRN
  Administered 2014-08-05: 10 ug/min via INTRAVENOUS

## 2014-08-05 MED ORDER — PHENOL 1.4 % MT LIQD: 1.0000 | OROMUCOSAL | Status: DC | PRN

## 2014-08-05 MED ORDER — FENTANYL CITRATE 0.05 MG/ML IJ SOLN
INTRAMUSCULAR | Status: AC
  Administered 2014-08-05: 50 ug via INTRAVENOUS
  Filled 2014-08-05: qty 2

## 2014-08-05 MED ORDER — METOCLOPRAMIDE HCL 5 MG/ML IJ SOLN
5.0000 mg | Freq: Three times a day (TID) | INTRAMUSCULAR | Status: DC | PRN
Start: 2014-08-05 — End: 2014-08-06

## 2014-08-05 MED ORDER — 0.9 % SODIUM CHLORIDE (POUR BTL) OPTIME
TOPICAL | Status: DC | PRN
  Administered 2014-08-05: 1000 mL

## 2014-08-05 MED ORDER — PROPOFOL 10 MG/ML IV BOLUS
INTRAVENOUS | Status: DC | PRN
  Administered 2014-08-05: 200 mg via INTRAVENOUS

## 2014-08-05 MED ORDER — KETOROLAC TROMETHAMINE 30 MG/ML IJ SOLN
INTRAMUSCULAR | Status: AC
  Filled 2014-08-05: qty 1

## 2014-08-05 MED ORDER — CEFAZOLIN SODIUM-DEXTROSE 2-3 GM-% IV SOLR
2.0000 g | Freq: Four times a day (QID) | INTRAVENOUS | Status: AC
  Administered 2014-08-05 – 2014-08-06 (×2): 2 g via INTRAVENOUS
  Filled 2014-08-05 (×2): qty 50

## 2014-08-05 MED ORDER — METHOCARBAMOL 1000 MG/10ML IJ SOLN
500.0000 mg | Freq: Four times a day (QID) | INTRAMUSCULAR | Status: DC | PRN
  Filled 2014-08-05: qty 5

## 2014-08-05 MED ORDER — DEXAMETHASONE SODIUM PHOSPHATE 4 MG/ML IJ SOLN
INTRAMUSCULAR | Status: AC
  Filled 2014-08-05: qty 2

## 2014-08-05 MED ORDER — SENNA 8.6 MG PO TABS
1.0000 | ORAL_TABLET | Freq: Two times a day (BID) | ORAL | Status: DC
  Administered 2014-08-05 – 2014-08-06 (×2): 8.6 mg via ORAL
  Filled 2014-08-05 (×2): qty 1

## 2014-08-05 MED ORDER — SODIUM CHLORIDE 0.9 % IR SOLN
Status: DC | PRN
  Administered 2014-08-05: 3000 mL

## 2014-08-05 MED ORDER — PHENYLEPHRINE HCL 10 MG/ML IJ SOLN
INTRAMUSCULAR | Status: DC | PRN
  Administered 2014-08-05 (×2): 80 ug via INTRAVENOUS

## 2014-08-05 MED ORDER — OXYCODONE HCL 5 MG PO TABS: 5.0000 mg | ORAL_TABLET | ORAL | Status: DC | PRN

## 2014-08-05 MED ORDER — FENTANYL CITRATE 0.05 MG/ML IJ SOLN
INTRAMUSCULAR | Status: AC
  Filled 2014-08-05: qty 5

## 2014-08-05 MED ORDER — BUPIVACAINE LIPOSOME 1.3 % IJ SUSP
INTRAMUSCULAR | Status: DC | PRN
  Administered 2014-08-05: 60 mL

## 2014-08-05 MED ORDER — LACTATED RINGERS IV SOLN
INTRAVENOUS | Status: DC
  Administered 2014-08-05: 11:00:00 via INTRAVENOUS

## 2014-08-05 MED ORDER — CEFAZOLIN SODIUM-DEXTROSE 2-3 GM-% IV SOLR
INTRAVENOUS | Status: AC
  Administered 2014-08-05: 2 g via INTRAVENOUS
  Filled 2014-08-05: qty 50

## 2014-08-05 MED ORDER — ONDANSETRON HCL 4 MG/2ML IJ SOLN
INTRAMUSCULAR | Status: DC | PRN
  Administered 2014-08-05: 4 mg via INTRAVENOUS

## 2014-08-05 MED ORDER — MIDAZOLAM HCL 2 MG/2ML IJ SOLN
INTRAMUSCULAR | Status: AC
  Filled 2014-08-05: qty 2

## 2014-08-05 MED ORDER — PHENYLEPHRINE 40 MCG/ML (10ML) SYRINGE FOR IV PUSH (FOR BLOOD PRESSURE SUPPORT)
PREFILLED_SYRINGE | INTRAVENOUS | Status: AC
  Filled 2014-08-05: qty 10

## 2014-08-05 MED ORDER — ONDANSETRON HCL 4 MG PO TABS
4.0000 mg | ORAL_TABLET | Freq: Four times a day (QID) | ORAL | Status: DC | PRN
  Administered 2014-08-06: 4 mg via ORAL
  Filled 2014-08-05: qty 1

## 2014-08-05 MED ORDER — POLYETHYLENE GLYCOL 3350 17 G PO PACK: 17.0000 g | PACK | Freq: Every day | ORAL | Status: DC | PRN

## 2014-08-05 MED ORDER — SORBITOL 70 % SOLN: 30.0000 mL | Freq: Every day | Status: DC | PRN

## 2014-08-05 MED ORDER — BUPROPION HCL ER (XL) 150 MG PO TB24
150.0000 mg | ORAL_TABLET | Freq: Every day | ORAL | Status: DC
  Administered 2014-08-06: 150 mg via ORAL
  Filled 2014-08-05: qty 1

## 2014-08-05 MED ORDER — SODIUM CHLORIDE 0.9 % IV SOLN: INTRAVENOUS | Status: DC

## 2014-08-05 MED ORDER — FENTANYL CITRATE 0.05 MG/ML IJ SOLN
INTRAMUSCULAR | Status: DC | PRN
  Administered 2014-08-05: 25 ug via INTRAVENOUS
  Administered 2014-08-05: 50 ug via INTRAVENOUS
  Administered 2014-08-05: 25 ug via INTRAVENOUS
  Administered 2014-08-05: 50 ug via INTRAVENOUS
  Administered 2014-08-05 (×2): 25 ug via INTRAVENOUS
  Administered 2014-08-05: 50 ug via INTRAVENOUS
  Administered 2014-08-05 (×2): 25 ug via INTRAVENOUS

## 2014-08-05 MED ORDER — SERTRALINE HCL 50 MG PO TABS
50.0000 mg | ORAL_TABLET | Freq: Every day | ORAL | Status: DC
  Administered 2014-08-06: 50 mg via ORAL
  Filled 2014-08-05: qty 1

## 2014-08-05 MED ORDER — ASPIRIN EC 325 MG PO TBEC
325.0000 mg | DELAYED_RELEASE_TABLET | Freq: Two times a day (BID) | ORAL | Status: DC
  Administered 2014-08-05: 325 mg via ORAL
  Filled 2014-08-05: qty 1

## 2014-08-05 MED ORDER — METOCLOPRAMIDE HCL 10 MG PO TABS: 5.0000 mg | ORAL_TABLET | Freq: Three times a day (TID) | ORAL | Status: DC | PRN

## 2014-08-05 SURGICAL SUPPLY — 68 items
BANDAGE ELASTIC 6 VELCRO ST LF (GAUZE/BANDAGES/DRESSINGS) ×4 IMPLANT
BANDAGE ESMARK 6X9 LF (GAUZE/BANDAGES/DRESSINGS) ×1 IMPLANT
BIT DRILL QUICK REL 1/8 2PK SL (DRILL) ×1 IMPLANT
BLADE SAG 18X100X1.27 (BLADE) IMPLANT
BLADE SAW SGTL 13.0X1.19X90.0M (BLADE) IMPLANT
BNDG ESMARK 6X9 LF (GAUZE/BANDAGES/DRESSINGS) ×2
BONE CEMENT PALACOSE (Orthopedic Implant) ×2 IMPLANT
BOWL SMART MIX CTS (DISPOSABLE) IMPLANT
CAPT KNEE PARTIAL 2 ×2 IMPLANT
CEMENT BONE PALACOSE (Orthopedic Implant) ×1 IMPLANT
COVER SURGICAL LIGHT HANDLE (MISCELLANEOUS) ×2 IMPLANT
CUFF TOURNIQUET SINGLE 34IN LL (TOURNIQUET CUFF) ×2 IMPLANT
CUFF TOURNIQUET SINGLE 44IN (TOURNIQUET CUFF) IMPLANT
DERMABOND ADVANCED (GAUZE/BANDAGES/DRESSINGS) ×1
DERMABOND ADVANCED .7 DNX12 (GAUZE/BANDAGES/DRESSINGS) ×1 IMPLANT
DRAPE EXTREMITY T 121X128X90 (DRAPE) ×2 IMPLANT
DRAPE IMP U-DRAPE 54X76 (DRAPES) ×2 IMPLANT
DRAPE INCISE IOBAN 66X45 STRL (DRAPES) ×2 IMPLANT
DRAPE ORTHO SPLIT 77X108 STRL (DRAPES) ×2
DRAPE PROXIMA HALF (DRAPES) ×2 IMPLANT
DRAPE SURG 17X11 SM STRL (DRAPES) ×4 IMPLANT
DRAPE SURG ORHT 6 SPLT 77X108 (DRAPES) ×2 IMPLANT
DRAPE U-SHAPE 47X51 STRL (DRAPES) ×2 IMPLANT
DRILL QUICK RELEASE 1/8 INCH (DRILL) ×1
DRSG AQUACEL AG ADV 3.5X10 (GAUZE/BANDAGES/DRESSINGS) ×2 IMPLANT
DRSG AQUACEL AG ADV 3.5X14 (GAUZE/BANDAGES/DRESSINGS) ×2 IMPLANT
DRSG PAD ABDOMINAL 8X10 ST (GAUZE/BANDAGES/DRESSINGS) ×2 IMPLANT
DURAPREP 26ML APPLICATOR (WOUND CARE) ×6 IMPLANT
ELECT CAUTERY BLADE 6.4 (BLADE) ×2 IMPLANT
ELECT REM PT RETURN 9FT ADLT (ELECTROSURGICAL) ×2
ELECTRODE REM PT RTRN 9FT ADLT (ELECTROSURGICAL) ×1 IMPLANT
EVACUATOR 1/8 PVC DRAIN (DRAIN) IMPLANT
FACESHIELD WRAPAROUND (MASK) ×6 IMPLANT
GAUZE SPONGE 4X4 12PLY STRL (GAUZE/BANDAGES/DRESSINGS) ×2 IMPLANT
GLOVE NEODERM STRL 7.5 LF PF (GLOVE) ×3 IMPLANT
GLOVE SURG NEODERM 7.5  LF PF (GLOVE) ×3
GOWN STRL REIN XL XLG (GOWN DISPOSABLE) ×6 IMPLANT
HANDPIECE INTERPULSE COAX TIP (DISPOSABLE)
IMMOBILIZER KNEE 22 UNIV (SOFTGOODS) IMPLANT
KIT BASIN OR (CUSTOM PROCEDURE TRAY) ×2 IMPLANT
KIT ROOM TURNOVER OR (KITS) ×2 IMPLANT
MANIFOLD NEPTUNE II (INSTRUMENTS) ×2 IMPLANT
NEEDLE SPNL 18GX3.5 QUINCKE PK (NEEDLE) ×2 IMPLANT
NS IRRIG 1000ML POUR BTL (IV SOLUTION) ×2 IMPLANT
PACK BLADE SAW RECIP 70 3 PT (BLADE) ×2 IMPLANT
PACK TOTAL JOINT (CUSTOM PROCEDURE TRAY) ×2 IMPLANT
PACK UNIVERSAL I (CUSTOM PROCEDURE TRAY) ×2 IMPLANT
PAD ARMBOARD 7.5X6 YLW CONV (MISCELLANEOUS) ×4 IMPLANT
PADDING CAST COTTON 6X4 STRL (CAST SUPPLIES) ×2 IMPLANT
SET HNDPC FAN SPRY TIP SCT (DISPOSABLE) IMPLANT
SET PAD KNEE POSITIONER (MISCELLANEOUS) ×2 IMPLANT
STAPLER VISISTAT 35W (STAPLE) IMPLANT
SUCTION FRAZIER TIP 10 FR DISP (SUCTIONS) IMPLANT
SUT ETHILON 2 0 FS 18 (SUTURE) IMPLANT
SUT MNCRL AB 4-0 PS2 18 (SUTURE) ×2 IMPLANT
SUT PDS AB 1 CT  36 (SUTURE)
SUT PDS AB 1 CT 36 (SUTURE) IMPLANT
SUT VIC AB 0 CT1 27 (SUTURE) ×2
SUT VIC AB 0 CT1 27XBRD ANBCTR (SUTURE) ×2 IMPLANT
SUT VIC AB 1 CT1 27 (SUTURE) ×2
SUT VIC AB 1 CT1 27XBRD ANBCTR (SUTURE) ×2 IMPLANT
SUT VIC AB 2-0 CT1 27 (SUTURE) ×2
SUT VIC AB 2-0 CT1 TAPERPNT 27 (SUTURE) ×2 IMPLANT
SYR 50ML LL SCALE MARK (SYRINGE) ×2 IMPLANT
TOWEL OR 17X24 6PK STRL BLUE (TOWEL DISPOSABLE) ×2 IMPLANT
TOWEL OR 17X26 10 PK STRL BLUE (TOWEL DISPOSABLE) ×2 IMPLANT
WATER STERILE IRR 1000ML POUR (IV SOLUTION) ×4 IMPLANT
WRAP KNEE MAXI GEL POST OP (GAUZE/BANDAGES/DRESSINGS) ×2 IMPLANT

## 2014-08-05 NOTE — Anesthesia Postprocedure Evaluation (Signed)
  Anesthesia Post-op Note  Patient: Brandi Lutz  Procedure(s) Performed: Procedure(s): LEFT UNICOMPARTMENTAL KNEE (Left)  Patient Location: PACU  Anesthesia Type:General  Level of Consciousness: awake, alert  and oriented  Airway and Oxygen Therapy: Patient Spontanous Breathing and Patient connected to nasal cannula oxygen  Post-op Pain: mild  Post-op Assessment: Post-op Vital signs reviewed, Patient's Cardiovascular Status Stable, Respiratory Function Stable, Patent Airway, No signs of Nausea or vomiting and Pain level controlled  Post-op Vital Signs: stable  Last Vitals:  Filed Vitals:   08/05/14 1630  BP: 108/75  Pulse: 100  Temp: 36.5 C  Resp: 10    Complications: No apparent anesthesia complications

## 2014-08-05 NOTE — H&P (Signed)
PREOPERATIVE H&P  Chief Complaint: left knee medial osteoarthritis  HPI: Brandi Lutz is a 39 y.o. female who presents for surgical treatment of left knee medial osteoarthritis.  She denies any changes in medical history.  Past Medical History  Diagnosis Date  . Gastric bypass status for obesity 02/05/2013  . Depression     takes Wellbutrin and Zoloft daily  . Cancer     colon  . PONV (postoperative nausea and vomiting)   . History of migraine     none since high school  . Arthritis   . Joint pain   . Joint swelling   . History of colon polyps     benign  . Other specified iron deficiency anemias 02/05/2013    2 iron infusions since gastric bypass;no abnormal reaction noted  . Anxiety    Past Surgical History  Procedure Laterality Date  . Arthroscopic repair acl Left 20 yrs ago  . Colectomy    . Gastric bypass    . Cholecystectomy    . Herniated bowel repair    . Knee arthroscopy Left   . Colonoscopy     History   Social History  . Marital Status: Single    Spouse Name: N/A  . Number of Children: N/A  . Years of Education: N/A   Social History Main Topics  . Smoking status: Never Smoker   . Smokeless tobacco: Not on file  . Alcohol Use: Yes     Comment: rarely  . Drug Use: No  . Sexual Activity: Not Currently   Other Topics Concern  . Not on file   Social History Narrative   No family history on file. No Known Allergies Prior to Admission medications   Medication Sig Start Date End Date Taking? Authorizing Provider  acetaminophen (TYLENOL) 500 MG tablet Take 1,000 mg by mouth every 8 (eight) hours as needed for mild pain or moderate pain.   Yes Historical Provider, MD  buPROPion (WELLBUTRIN XL) 150 MG 24 hr tablet Take 150 mg by mouth daily.   Yes Historical Provider, MD  Multiple Vitamins-Minerals (MULTIVITAMIN WITH MINERALS) tablet Take 1 tablet by mouth daily. Women's ultra Multi-vitamin   Yes Historical Provider, MD  sertraline (ZOLOFT) 50 MG  tablet Take 50 mg by mouth daily.   Yes Historical Provider, MD  Vitamin D, Ergocalciferol, (DRISDOL) 50000 UNITS CAPS capsule Take 50,000 Units by mouth every 7 (seven) days. Saturday   Yes Historical Provider, MD     Positive ROS: All other systems have been reviewed and were otherwise negative with the exception of those mentioned in the HPI and as above.  Physical Exam: General: Alert, no acute distress Cardiovascular: No pedal edema Respiratory: No cyanosis, no use of accessory musculature GI: abdomen soft Skin: No lesions in the area of chief complaint Neurologic: Sensation intact distally Psychiatric: Patient is competent for consent with normal mood and affect Lymphatic: no lymphedema  MUSCULOSKELETAL: exam stable  Assessment: left knee medial osteoarthritis  Plan: Plan for Procedure(s): LEFT UNICOMPARTMENTAL KNEE  The risks benefits and alternatives were discussed with the patient including but not limited to the risks of nonoperative treatment, versus surgical intervention including infection, bleeding, nerve injury,  blood clots, cardiopulmonary complications, morbidity, mortality, among others, and they were willing to proceed.   Marianna Payment, MD   08/05/2014 6:33 AM

## 2014-08-05 NOTE — Transfer of Care (Signed)
Immediate Anesthesia Transfer of Care Note  Patient: Brandi Lutz  Procedure(s) Performed: Procedure(s): LEFT UNICOMPARTMENTAL KNEE (Left)  Patient Location: PACU  Anesthesia Type:General  Level of Consciousness: awake, alert  and oriented  Airway & Oxygen Therapy: Patient Spontanous Breathing and Patient connected to nasal cannula oxygen  Post-op Assessment: Report given to RN, Post -op Vital signs reviewed and stable and Patient moving all extremities X 4  Post vital signs: Reviewed and stable  Last Vitals:  Filed Vitals:   08/05/14 1224  BP: 127/79  Pulse: 92  Temp:   Resp: 18    Complications: No apparent anesthesia complications

## 2014-08-05 NOTE — Plan of Care (Signed)
Problem: Consults Goal: Diagnosis- Total Joint Replacement Partial/Uniknee Lt Knee

## 2014-08-05 NOTE — Op Note (Signed)
Date of surgery: 08/09/2014  Preoperative diagnosis: Medial compartment left knee osteoarthritis  Postoperative diagnosis: Same  Procedure: Unicompartmental knee arthroplasty, left knee  Surgeon: Eduard Roux, M.D.  Anesthesia: Gen. and regional  Estimated blood loss: Minimal  Drains: One medium Hemovac  Complications: None  Indications for procedure: Ms. Bacorn is a 39 year old female who presents for surgical treatment of the above mentioned condition after failing conservative treatment and arthroscopic treatment. A previous knee arthroscopy revealed isolated disease to the medial compartment. The risks, benefits, and alternatives to surgery were discussed with the patient including the potential for conversion to a total knee was confirmed with the patient and she voiced understanding and wished to proceed.  Description of procedure: The patient was identified in the preoperative holding area. The operative site was marked by the surgeon and confirmed with the patient. [name] brought back to the operating room. She was placed supine on the table. A nonsterile tourniquet was placed on the left upper thigh. The operative extremity was placed in the special leg holder. Care was taken not to place the holder in the popliteal fossa. All bony prominences were well-padded. The foot of the bed was lowered down. The left lower extremity was prepped and draped in standard sterile fashion. A timeout was performed. Pre-incisional antibiotics were given. The extremity was exsanguinated using Esmarch bandage and the tourniquet was inflated to 350 mmHg. We ellipsed out her previous incision from her previous surgery. Full-thickness flaps were created. Dissection was carried down to the level of the patellar retinaculum. The superior medial corner of the patella was identified. A limited medial parapatellar arthrotomy was performed.  The knee was exposed. The anterior surface of the tibia was also exposed but  care was taken not to release any of the medial structures. Osteophytes were taken off of the tibial plateau, femoral notch, femoral condyle. We then placed a medium spoon into the medial compartment and this gave good excursion and feel. We then hooked the extra medullary tibial guide to the spoon with the #4G clamp.  Once the extra medullary guide was set to the appropriate height and slope I performed the sagittal cut with a reciprocating saw and at the patient's ASIS. The cut was made just medial to the tibial eminence. After this cut I then performed the horizontal tibial cut with an oscillating saw. Care was taken not to plunge into the posterior knee structures.  Once this was done we then took out the piece of cut tibia and sized a C tibia.  After this part we then turned our attention to the femoral side. We placed an intramedullary rod into the femur. We then marked a Bovie line down the center of the femoral condyle. We then placed the femoral drill guide into the medial compartment and length it to the intramedullary rod I was able to visualize the Bovie line through the large hole of the femoral drill guide. This guide was drilled and then replaced with a posterior resection guide. The oscillating saw was used to perform the posterior femoral cut. After this we took out the remainder of the meniscus. Once the compartment was adequately cleared out we then milled the femoral side to the appropriate depth in order to give [place] balanced flexion and extension gaps. We found that a size 3 poly was the appropriate thickness.  The polyethylene liner tracked well. We then turned our attention to the tibia side. With the tibial tray held in place we made a cut for  the keel. After this was done we then turned our attention to the femoral side and milled out the anterior portion of the femur to prevent any impingement. We then pulse lavaged the wound out while the cement was being mixed. We then cemented the tibia  and the femoral component sequentially and pressurized the cement with a size 4 poly.  Once the cement cured we placed our final poly.  The wound was thoroughly irrigated and a medium Hemovac was placed. The arthrotomy was closed with interrupted #1 Vicryl. The rest of the wound was closed in layer fashion using 0 Vicryl, 2-0 Vicryl, 4-0 Monocryl. Dermabond was placed on the incision Sterile dressings were applied.  Patient was extubated and transferred to the PACU in stable condition.  Postoperative plan: The patient will be weight bear as tolerated to left lower extremity. She will be up with physical therapy in the morning and discharge home tomorrow. She will be on aspirin for DVT prophylaxis.  Azucena Cecil, MD [facility] [phone] 3:30 PM

## 2014-08-05 NOTE — Discharge Instructions (Signed)
1. Remove dressing on 08/12/14.  2. Put gauze dressing on incision after removing surgical dressing 3. May get incision wet while showering after 10 days.  Do not submerge incision until notified. 4. Increase activity as tolerated 5. Ice the operative 20 minutes on and 20 minutes off around the clock.  6. Avoid any dental visits including dental cleanings for 6 months after surgery.

## 2014-08-05 NOTE — Anesthesia Procedure Notes (Signed)
Anesthesia Regional Block:  Adductor canal block  Pre-Anesthetic Checklist: ,, timeout performed, Correct Patient, Correct Site, Correct Laterality, Correct Procedure, Correct Position, site marked, Risks and benefits discussed,  Surgical consent,  Pre-op evaluation,  At surgeon's request and post-op pain management  Laterality: Lower and Left  Prep: Maximum Sterile Barrier Precautions used       Needles:   Needle Type: Echogenic Stimulator Needle     Needle Length: 10cm 10 cm Needle Gauge: 21 and 21 G    Additional Needles:  Procedures: ultrasound guided (picture in chart) Adductor canal block Narrative:  Start time: 08/05/2014 12:10 PM End time: 08/05/2014 12:20 PM  Performed by: Personally  Anesthesiologist: Alexis Frock  Additional Notes: L AC block, 46ml .5% marcaine with epi, US guided, talked to patient throughout procedure, no complications

## 2014-08-06 ENCOUNTER — Encounter (HOSPITAL_COMMUNITY): Payer: Self-pay | Admitting: General Practice

## 2014-08-06 DIAGNOSIS — M1712 Unilateral primary osteoarthritis, left knee: Secondary | ICD-10-CM | POA: Diagnosis not present

## 2014-08-06 LAB — CBC
HCT: 26.9 % — ABNORMAL LOW (ref 36.0–46.0)
Hemoglobin: 8.7 g/dL — ABNORMAL LOW (ref 12.0–15.0)
MCH: 23.3 pg — ABNORMAL LOW (ref 26.0–34.0)
MCHC: 32.3 g/dL (ref 30.0–36.0)
MCV: 72.1 fL — ABNORMAL LOW (ref 78.0–100.0)
Platelets: 256 10*3/uL (ref 150–400)
RBC: 3.73 MIL/uL — ABNORMAL LOW (ref 3.87–5.11)
RDW: 15.1 % (ref 11.5–15.5)
WBC: 7.5 10*3/uL (ref 4.0–10.5)

## 2014-08-06 LAB — BASIC METABOLIC PANEL
Anion gap: 2 — ABNORMAL LOW (ref 5–15)
BUN: 7 mg/dL (ref 6–23)
CO2: 30 mmol/L (ref 19–32)
Calcium: 8.1 mg/dL — ABNORMAL LOW (ref 8.4–10.5)
Chloride: 108 mmol/L (ref 96–112)
Creatinine, Ser: 0.79 mg/dL (ref 0.50–1.10)
GFR calc Af Amer: >90 mL/min (ref >90)
GFR calc non Af Amer: >90 mL/min (ref >90)
Glucose, Bld: 116 mg/dL — ABNORMAL HIGH (ref 70–99)
Potassium: 3.5 mmol/L (ref 3.5–5.1)
Sodium: 140 mmol/L (ref 135–145)

## 2014-08-06 MED ORDER — ENOXAPARIN SODIUM 40 MG/0.4ML ~~LOC~~ SOLN
40.0000 mg | Freq: Every day | SUBCUTANEOUS | Status: DC
  Administered 2014-08-06: 40 mg via SUBCUTANEOUS
  Filled 2014-08-06: qty 0.4

## 2014-08-06 MED ORDER — SODIUM CHLORIDE 0.9 % IV BOLUS (SEPSIS)
1000.0000 mL | Freq: Once | INTRAVENOUS | Status: AC
  Administered 2014-08-06: 1000 mL via INTRAVENOUS

## 2014-08-06 MED ORDER — ENOXAPARIN SODIUM 40 MG/0.4ML ~~LOC~~ SOLN: 40.0000 mg | Freq: Every day | SUBCUTANEOUS | Status: DC

## 2014-08-06 MED ORDER — OXYCODONE HCL 5 MG PO TABS: 5.0000 mg | ORAL_TABLET | ORAL | Status: DC | PRN

## 2014-08-06 NOTE — Progress Notes (Signed)
Patient provided with discharge instructions and follow up information. [name] going home with outpatient PT. No equipment provided as patient educated with use of crutches and has crutches at need. Going home at this time with her mother for support.

## 2014-08-06 NOTE — Progress Notes (Signed)
Patient education r/t self-administration of Lovenox SQ at home x 2 weeks - mother will be giving injections - taught mother and she demonstrated excellent technique administered her afternoon dose LLQ abdomen.

## 2014-08-06 NOTE — Evaluation (Addendum)
Physical Therapy Evaluation Patient Details Name: Brandi Lutz MRN: [id number] DOB: Aug 13, 1975 Today's Date: 08/06/2014   History of Present Illness  Brandi Lutz is a 39 y.o. female who presents for surgical treatment of left knee medial osteoarthritis. She denies any changes in medical history.  Clinical Impression  Pt is s/p TKA resulting in the deficits listed below (see PT Problem List). Pt progressing extremely well. Will do stair negotiation later today in prep for d/c today.  Pt will benefit from skilled PT to increase their independence and safety with mobility to allow discharge to the venue listed below.      Follow Up Recommendations Outpatient PT;Supervision - Intermittent    Equipment Recommendations  Rolling walker with 5" wheels    Recommendations for Other Services       Precautions / Restrictions Precautions Precautions: Knee Precaution Booklet Issued: Yes (comment) Precaution Comments: Reviewed knee precautions and no pillow under knee Restrictions Weight Bearing Restrictions: Yes LLE Weight Bearing: Weight bearing as tolerated      Mobility  Bed Mobility Overal bed mobility: Modified Independent             General bed mobility comments: v/c's for long sit technique  Transfers Overall transfer level: Needs assistance Equipment used: Rolling walker (2 wheeled) Transfers: Sit to/from Stand Sit to Stand: Min guard         General transfer comment: v/c's for hand placement  Ambulation/Gait Ambulation/Gait assistance: Min guard Ambulation Distance (Feet): 150 Feet Assistive device: Rolling walker (2 wheeled) Gait Pattern/deviations: Step-through pattern Gait velocity: decreased Gait velocity interpretation: Below normal speed for age/gender General Gait Details: v/c's to con't push RW for sequenctial step through pattern. v/c's to decrease bilat UE WBing. Pt did have an episode of lightheadedness and feeling of nausea limiting further  amb.  Stairs            Wheelchair Mobility    Modified Rankin (Stroke Patients Only)       Balance Overall balance assessment: Modified Independent (pt stood at sink without UE assist to wash hands)                                           Pertinent Vitals/Pain Pain Assessment: 0-10 Pain Score: 4  Pain Location: back of left knee Pain Descriptors / Indicators: Aching Pain Intervention(s): Monitored during session    Home Living Family/patient expects to be discharged to:: Private residence Living Arrangements: Parent (mom) Available Help at Discharge: Family;Available 24 hours/day (mom will be there 24/7 for the next 3 days ) Type of Home: House Home Access: Stairs to enter Entrance Stairs-Rails: Can reach both Entrance Stairs-Number of Steps: 4 Home Layout: One level Home Equipment: None      Prior Function Level of Independence: Independent               Hand Dominance   Dominant Hand: Right    Extremity/Trunk Assessment   Upper Extremity Assessment: Overall WFL for tasks assessed           Lower Extremity Assessment: Defer to PT evaluation   LLE Deficits / Details: able to do quad set and active flex Lt knee to 80 deg  Cervical / Trunk Assessment: Normal  Communication   Communication: No difficulties  Cognition Arousal/Alertness: Awake/alert Behavior During Therapy: WFL for tasks assessed/performed Overall Cognitive Status: Within Functional Limits for tasks assessed  General Comments General comments (skin integrity, edema, etc.): pt assisted to bathroom. pt supervision with transfer on/off commode and hygiene.    Exercises  ankle pumps, quad sets, LAQ, heel slides: L LE x 10 reps Active L knee flexion 3-90 deg in sitting      Assessment/Plan    PT Assessment Patient needs continued PT services  PT Diagnosis Difficulty walking;Acute pain   PT Problem List Decreased  strength;Decreased range of motion;Decreased activity tolerance;Decreased mobility  PT Treatment Interventions DME instruction;Gait training;Stair training;Functional mobility training;Therapeutic activities;Therapeutic exercise   PT Goals (Current goals can be found in the Care Plan section) Acute Rehab PT Goals Patient Stated Goal: home PT Goal Formulation: With patient Time For Goal Achievement: 08/13/14 Potential to Achieve Goals: Good    Frequency 7X/week   Barriers to discharge        Co-evaluation               End of Session Equipment Utilized During Treatment: Gait belt Activity Tolerance: Patient tolerated treatment well Patient left: in chair;with call bell/phone within reach Nurse Communication: Mobility status    Functional Assessment Tool Used: clinical judgement Functional Limitation: Mobility: Walking and moving around Mobility: Walking and Moving Around Current Status (V9563): At least 1 percent but less than 20 percent impaired, limited or restricted Mobility: Walking and Moving Around Goal Status [phone]): At least 1 percent but less than 20 percent impaired, limited or restricted    Time: 0807-0842 PT Time Calculation (min) (ACUTE ONLY): 35 min   Charges:   PT Evaluation $Initial PT Evaluation Tier I: 1 Procedure PT Treatments $Gait Training: 8-22 mins   PT G Codes:   PT G-Codes **NOT FOR INPATIENT CLASS** Functional Assessment Tool Used: clinical judgement Functional Limitation: Mobility: Walking and moving around Mobility: Walking and Moving Around Current Status (P3295): At least 1 percent but less than 20 percent impaired, limited or restricted Mobility: Walking and Moving Around Goal Status [phone]): At least 1 percent but less than 20 percent impaired, limited or restricted    Brandi Lutz 08/06/2014, 10:11 AM   Brandi Lutz, PT, DPT Pager #: [phone] Office #: [phone]

## 2014-08-06 NOTE — Care Management Note (Signed)
    Page 1 of 1   08/06/2014     4:04:57 PM CARE MANAGEMENT NOTE 08/06/2014  Patient:  Brandi Lutz, Brandi Lutz   Account Number:  [id number]  Date Initiated:  08/06/2014  Documentation initiated by:  Lorne Skeens  Subjective/Objective Assessment:   Patient was admitted for L unicompartmental knee. Lives at home.     Action/Plan:   Will follow for discharge needs.   Anticipated DC Date:     Anticipated DC Plan:  [place]  CM consult      Choice offered to / List presented to:             Status of service:   Medicare Important Message given?   (If response is "NO", the following Medicare IM given date fields will be blank) Date Medicare IM given:   Medicare IM given by:   Date Additional Medicare IM given:   Additional Medicare IM given by:    Discharge Disposition:    Per UR Regulation:    If discussed at Long Length of Stay Meetings, dates discussed:    Comments:  08/06/14 Lorne Skeens RN, MSN, CM- Patient was previously scheduled to go home with home health through [facility].  Per therapy recommendations, discharge plans have been changed to outpatient.  Office was notified and will make arrangements.

## 2014-08-06 NOTE — Discharge Summary (Signed)
Physician Discharge Summary      Patient ID: Brandi Lutz MRN: [id number] DOB/AGE: 41-Oct-1977 41 y.o.  Admit date: 08/05/2014 Discharge date: 08/06/2014  Admission Diagnoses:  <principal problem not specified>  Discharge Diagnoses:  Active Problems:   Localized osteoarthritis of left knee   Status post left partial knee replacement   Past Medical History  Diagnosis Date  . Gastric bypass status for obesity 02/05/2013  . Depression     takes Wellbutrin and Zoloft daily  . Cancer     colon  . PONV (postoperative nausea and vomiting)   . History of migraine     none since high school  . Arthritis   . Joint pain   . Joint swelling   . History of colon polyps     benign  . Other specified iron deficiency anemias 02/05/2013    2 iron infusions since gastric bypass;no abnormal reaction noted  . Anxiety   . S/P left unicompartmental knee replacement 08/05/2014    Surgeries: Procedure(s): LEFT UNICOMPARTMENTAL KNEE on 08/05/2014   Consultants (if any):    Discharged Condition: Improved  Hospital Course: Brandi Lutz is an 39 y.o. female who was admitted 08/05/2014 with a diagnosis of <principal problem not specified> and went to the operating room on 08/05/2014 and underwent the above named procedures.    She was given perioperative antibiotics:  Anti-infectives    Start     Dose/Rate Route Frequency Ordered Stop   08/05/14 1800  ceFAZolin (ANCEF) IVPB 2 g/50 mL premix     2 g 100 mL/hr over 30 Minutes Intravenous Every 6 hours 08/05/14 1640 08/06/14 0126   08/05/14 1032  ceFAZolin (ANCEF) 2-3 GM-% IVPB SOLR    Comments:  Henrine Screws   : cabinet override      08/05/14 1032 08/05/14 1247   08/04/14 1107  ceFAZolin (ANCEF) IVPB 2 g/50 mL premix  Status:  Discontinued     2 g 100 mL/hr over 30 Minutes Intravenous On call to O.R. 08/04/14 1107 08/05/14 1640    .  She was given sequential compression devices, early ambulation, and lovenox for DVT prophylaxis.  She  benefited maximally from the hospital stay and there were no complications.    Recent vital signs:  Filed Vitals:   08/06/14 0507  BP: 98/53  Pulse:   Temp:   Resp:     Recent laboratory studies:  Lab Results  Component Value Date   HGB 8.7* 08/06/2014   HGB 10.3* 08/05/2014   HGB 10.6* 07/22/2014   Lab Results  Component Value Date   WBC 7.5 08/06/2014   PLT 256 08/06/2014   Lab Results  Component Value Date   INR 1.07 07/22/2014   Lab Results  Component Value Date   NA 140 08/06/2014   K 3.5 08/06/2014   CL 108 08/06/2014   CO2 30 08/06/2014   BUN 7 08/06/2014   CREATININE 0.79 08/06/2014   GLUCOSE 116* 08/06/2014    Discharge Medications:     Medication List    TAKE these medications        acetaminophen 500 MG tablet  Commonly known as:  TYLENOL  Take 1,000 mg by mouth every 8 (eight) hours as needed for mild pain or moderate pain.     buPROPion 150 MG 24 hr tablet  Commonly known as:  WELLBUTRIN XL  Take 150 mg by mouth daily.     enoxaparin 40 MG/0.4ML injection  Commonly known as:  LOVENOX  Inject  0.4 mLs (40 mg total) into the skin daily.     multivitamin with minerals tablet  Take 1 tablet by mouth daily. Women's ultra Multi-vitamin     oxyCODONE 5 MG immediate release tablet  Commonly known as:  Oxy IR/ROXICODONE  Take 1-3 tablets (5-15 mg total) by mouth every 4 (four) hours as needed.     oxyCODONE 5 MG immediate release tablet  Commonly known as:  Oxy IR/ROXICODONE  Take 1-3 tablets (5-15 mg total) by mouth every 4 (four) hours as needed.     sertraline 50 MG tablet  Commonly known as:  ZOLOFT  Take 50 mg by mouth daily.     Vitamin D (Ergocalciferol) 50000 UNITS Caps capsule  Commonly known as:  DRISDOL  Take 50,000 Units by mouth every 7 (seven) days. Saturday        Diagnostic Studies: Dg Knee Left Port  08/05/2014   CLINICAL DATA:  Post knee surgery.  EXAM: PORTABLE LEFT KNEE - 1-2 VIEW  COMPARISON:  MRI 11/29/2013   FINDINGS: Remote changes of prior ACL repair. Interval right hemi arthroplasty changes in the medial compartment. No hardware or bony complicating feature. Soft tissue drain in place. Soft tissue and joint space gas present.  IMPRESSION: Right medial hemiarthroplasty.  No complicating feature.   Electronically Signed   By: Rolm Baptise M.D.   On: 08/05/2014 16:31    Disposition: Final discharge disposition not confirmed      Discharge Instructions    Call MD / Call 911    Complete by:  As directed   If you experience chest pain or shortness of breath, CALL 911 and be transported to the hospital emergency room.  If you develope a fever above 101.5 F, pus (white drainage) or increased drainage or redness at the wound, or calf pain, call your surgeon's office.     Constipation Prevention    Complete by:  As directed   Drink plenty of fluids.  Prune juice may be helpful.  You may use a stool softener, such as Colace (over the counter) 100 mg twice a day.  Use MiraLax (over the counter) for constipation as needed.     Diet - low sodium heart healthy    Complete by:  As directed      Diet general    Complete by:  As directed      Do not put a pillow under the knee. Place it under the heel.    Complete by:  As directed      Driving restrictions    Complete by:  As directed   No driving while taking narcotic pain meds.     Increase activity slowly as tolerated    Complete by:  As directed      TED hose    Complete by:  As directed   Use stockings (TED hose) for 6 weeks on both leg(s).  You may remove them at night for sleeping.     Weight bearing as tolerated    Complete by:  As directed            Follow-up Information    Follow up with Marianna Payment, MD In 2 weeks.   Specialty:  Orthopedic Surgery   Why:  For wound re-check   Contact information:   [address] [place] [address] [phone]        Signed: Marianna Payment 08/06/2014, 3:49 PM

## 2014-08-06 NOTE — Progress Notes (Signed)
Physical Therapy Treatment Patient Details Name: Brandi Lutz MRN: [id number] DOB: [birth year] Today's Date: 08/06/2014    History of Present Illness Brandi Lutz is a 39 y.o. female who presents for surgical treatment of left knee medial osteoarthritis. She denies any changes in medical history.    PT Comments    Pt progressing extremely well. Pt transitioned from RW to R unilateral crutch with mild antalgia and good step through gait pattern. Pt safe to d/c home with mother and crutches (pt has at home) once cleared medically.   Follow Up Recommendations  Outpatient PT;Supervision - Intermittent     Equipment Recommendations  None recommended by PT (pt has crutches at home)    Recommendations for Other Services       Precautions / Restrictions Precautions Precautions: Knee Precaution Booklet Issued: Yes (comment) Precaution Comments: Reviewed knee precautions and no pillow under knee Restrictions Weight Bearing Restrictions: Yes LLE Weight Bearing: Weight bearing as tolerated    Mobility  Bed Mobility Overal bed mobility: Modified Independent                Transfers Overall transfer level: Needs assistance Equipment used: Rolling walker (2 wheeled) Transfers: Sit to/from Stand Sit to Stand: Supervision         General transfer comment: pt with good technique and then transitioned to unilateral crutch and pt demo'd good transfer technique then as well.  Ambulation/Gait Ambulation/Gait assistance: Supervision Ambulation Distance (Feet): 460 Feet Assistive device: Rolling walker (2 wheeled);Crutches Gait Pattern/deviations: Step-through pattern Gait velocity: decreased   General Gait Details: transitioned from RW to R crutch. pt demo'd excellent sequencing and stability in addition to good gait pattern with good knee extension in stance phase and flexion in swing phase   Stairs Stairs: Yes Stairs assistance: Supervision Stair Management: Two  rails;Step to pattern Number of Stairs: 4 General stair comments: pt with good technique  Wheelchair Mobility    Modified Rankin (Stroke Patients Only)       Balance                                    Cognition Arousal/Alertness: Awake/alert Behavior During Therapy: WFL for tasks assessed/performed Overall Cognitive Status: Within Functional Limits for tasks assessed                      Exercises Total Joint Exercises Ankle Circles/Pumps: AROM;Both;10 reps Straight Leg Raises: AROM;Left;10 reps;Supine Knee Flexion: AROM;Left;10 reps    General Comments        Pertinent Vitals/Pain Pain Assessment: 0-10 Pain Score: 2  Pain Location: Lt knee Pain Descriptors / Indicators: Aching Pain Intervention(s): Monitored during session    Home Living Family/patient expects to be discharged to:: Private residence Living Arrangements: Parent Available Help at Discharge: Family;Available 24 hours/day (mom will be there 24/7 for the next 3 days ) Type of Home: House Home Access: Stairs to enter   Home Layout: One level Home Equipment: None      Prior Function Level of Independence: Independent          PT Goals (current goals can now be found in the care plan section) Acute Rehab PT Goals Patient Stated Goal: go home Progress towards PT goals: Progressing toward goals    Frequency  7X/week    PT Plan Current plan remains appropriate    Co-evaluation  End of Session Equipment Utilized During Treatment: Gait belt Activity Tolerance: Patient tolerated treatment well Patient left: in bed;with call bell/phone within reach     Time: 1121-1141 PT Time Calculation (min) (ACUTE ONLY): 20 min  Charges:  $Gait Training: 8-22 mins                    G Codes:      Kingsley Callander 08/06/2014, 1:06 PM   Kittie Plater, PT, DPT Pager #: [phone] Office #: [phone]

## 2014-08-06 NOTE — Evaluation (Signed)
Occupational Therapy Evaluation Patient Details Name: Brandi Lutz MRN: [id number] DOB: 11/27/1975 Today's Date: 08/06/2014    History of Present Illness Brandi Lutz is a 39 y.o. female who presents for surgical treatment of left knee medial osteoarthritis. She denies any changes in medical history.   Clinical Impression   Patient independent PTA. Patient currently functioning at an overall supervision level. Patient will benefit from acute OT to increase overall independence in the areas of ADLs, functional mobility, and overall safety in order to safely discharge home with mother to assist prn.     Follow Up Recommendations  No OT follow up;Supervision - Intermittent    Equipment Recommendations  None recommended by OT    Recommendations for Other Services  None at this time.     Precautions / Restrictions Precautions Precautions: Knee Precaution Booklet Issued: Yes (comment) Precaution Comments: Reviewed knee precautions and no pillow under knee Restrictions Weight Bearing Restrictions: Yes LLE Weight Bearing: Weight bearing as tolerated      Mobility - Per PT eval Bed Mobility Overal bed mobility: Modified Independent General bed mobility comments: v/c's for long sit technique  Transfers Overall transfer level: Needs assistance Equipment used: Rolling walker (2 wheeled) Transfers: Sit to/from Stand Sit to Stand: Min guard  General transfer comment: v/c's for hand placement    Balance - Per PT eval Overall balance assessment: Modified Independent (pt stood at sink without UE assist to wash hands)     ADL Overall ADL's : Needs assistance/impaired Eating/Feeding: Independent;Sitting   Grooming: Supervision/safety;Standing   Upper Body Bathing: Set up;Sitting   Lower Body Bathing: Supervison/ safety;Sit to/from stand   Upper Body Dressing : Set up;Sitting   Lower Body Dressing: Supervision/safety;Sit to/from stand   Toilet Transfer:  Supervision/safety;RW;Ambulation   Toileting- Water quality scientist and Hygiene: Supervision/safety;Sit to/from stand       Functional mobility during ADLs: Supervision/safety;Rolling walker General ADL Comments: Patient able to reach BLEs for LB ADLs. Patient overall supervision for ADLs and functional mobility/transfers. Patient will benefit from education and practice on tub/shower transfer without use of DME prior to discharge > home with no follow-up OT.     Pertinent Vitals/Pain Pain Assessment: 0-10 Pain Score: 4  Pain Location: back of left knee Pain Descriptors / Indicators: Aching Pain Intervention(s): Monitored during session     Hand Dominance Right   Extremity/Trunk Assessment Upper Extremity Assessment Upper Extremity Assessment: Overall WFL for tasks assessed   Lower Extremity Assessment Lower Extremity Assessment: Defer to PT evaluation LLE Deficits / Details: able to do quad set and active flex Lt knee to 80 deg   Cervical / Trunk Assessment Cervical / Trunk Assessment: Normal   Communication Communication Communication: No difficulties   Cognition Arousal/Alertness: Awake/alert Behavior During Therapy: WFL for tasks assessed/performed Overall Cognitive Status: Within Functional Limits for tasks assessed             Home Living Family/patient expects to be discharged to:: Private residence Living Arrangements: Parent (mom) Available Help at Discharge: Family;Available 24 hours/day (mom will be there 24/7 for the next 3 days ) Type of Home: House Home Access: Stairs to enter [employer] of Steps: 4 Entrance Stairs-Rails: Can reach both Home Layout: One level     Bathroom Shower/Tub: Corporate investment banker: Standard     Home Equipment: None          Prior Functioning/Environment Level of Independence: Independent      OT Diagnosis: Generalized weakness;Acute pain   OT  Problem List: Decreased  strength;Decreased activity tolerance;Decreased safety awareness;Decreased knowledge of use of DME or AE;Pain   OT Treatment/Interventions: Self-care/ADL training;Energy conservation;DME and/or AE instruction;Therapeutic activities;Patient/family education    OT Goals(Current goals can be found in the care plan section) Acute Rehab OT Goals Patient Stated Goal: go home OT Goal Formulation: With patient Time For Goal Achievement: 08/13/14 Potential to Achieve Goals: Good ADL Goals Pt Will Perform Grooming: with modified independence;standing Pt Will Perform Lower Body Bathing: with modified independence;sit to/from stand Pt Will Perform Lower Body Dressing: with modified independence;sit to/from stand Pt Will Transfer to Toilet: with modified independence;ambulating;regular height toilet Pt Will Perform Toileting - Clothing Manipulation and hygiene: with modified independence;sit to/from stand Pt Will Perform Tub/Shower Transfer: Independently;Tub transfer;rolling walker;ambulating  OT Frequency: Min 2X/week   Barriers to D/C: None known at this time          End of Session Equipment Utilized During Treatment: Rolling walker  Activity Tolerance: Patient tolerated treatment well Patient left: in chair;with call bell/phone within reach;with nursing/sitter in room   Time: 0948-1010 OT Time Calculation (min): 22 min Charges:  OT General Charges $OT Visit: 1 Procedure OT Evaluation $Initial OT Evaluation Tier I: 1 Procedure  Emilyann Banka , MS, OTR/L, CLT Pager: [phone]  08/06/2014, 10:16 AM

## 2014-08-06 NOTE — Progress Notes (Signed)
   Subjective:  Patient reports pain as moderate.  No events.  Objective:   VITALS:   Filed Vitals:   08/06/14 0000 08/06/14 0400 08/06/14 0506 08/06/14 0507  BP:   97/53 98/53  Pulse:   94   Temp:   97.8 F (36.6 C)   TempSrc:   Oral   Resp: 16 16    Height:      Weight:      SpO2: 100% 99% 99%     Neurologically intact Neurovascular intact Sensation intact distally Intact pulses distally Dorsiflexion/Plantar flexion intact Incision: dressing C/D/I and no drainage No cellulitis present Compartment soft   Lab Results  Component Value Date   WBC 7.5 08/06/2014   HGB 8.7* 08/06/2014   HCT 26.9* 08/06/2014   MCV 72.1* 08/06/2014   PLT 256 08/06/2014     Assessment/Plan:  1 Day Post-Op   - Expected postop acute blood loss anemia - will monitor for symptoms - Up with PT/OT - DVT ppx - SCDs, ambulation, lovenox - WBAT left lower extremity - Pain control - Discharge planning - home today after PT - HVAC removed  Marianna Payment 08/06/2014, 8:00 AM [phone]

## 2014-08-06 NOTE — Progress Notes (Signed)
UR completed 

## 2014-08-15 ENCOUNTER — Ambulatory Visit: Payer: 59 | Attending: Orthopaedic Surgery | Admitting: Physical Therapy

## 2014-08-15 ENCOUNTER — Encounter: Payer: Self-pay | Admitting: Physical Therapy

## 2014-08-15 DIAGNOSIS — R609 Edema, unspecified: Secondary | ICD-10-CM | POA: Insufficient documentation

## 2014-08-15 DIAGNOSIS — M25562 Pain in left knee: Secondary | ICD-10-CM | POA: Insufficient documentation

## 2014-08-15 DIAGNOSIS — R269 Unspecified abnormalities of gait and mobility: Secondary | ICD-10-CM | POA: Diagnosis not present

## 2014-08-15 DIAGNOSIS — R29898 Other symptoms and signs involving the musculoskeletal system: Secondary | ICD-10-CM | POA: Insufficient documentation

## 2014-08-15 DIAGNOSIS — M25662 Stiffness of left knee, not elsewhere classified: Secondary | ICD-10-CM | POA: Insufficient documentation

## 2014-08-15 DIAGNOSIS — R262 Difficulty in walking, not elsewhere classified: Secondary | ICD-10-CM | POA: Diagnosis not present

## 2014-08-15 NOTE — Therapy (Signed)
[facility] [address]  [place], [place], [address] Phone: [phone]   Fax:  [phone]  Physical Therapy Evaluation  Patient Details  Name: FABIANA DROMGOOLE MRN: [id number] Date of Birth: 05-28-1976 Referring Provider:  Marianna Payment, MD  Encounter Date: 08/15/2014      PT End of Session - 08/15/14 1201    Visit Number 1   Number of Visits 16   Date for PT Re-Evaluation 09/12/14   PT Start Time 1100   PT Stop Time 1200   PT Time Calculation (min) 60 min      Past Medical History  Diagnosis Date  . Gastric bypass status for obesity 02/05/2013  . Depression     takes Wellbutrin and Zoloft daily  . Cancer     colon  . PONV (postoperative nausea and vomiting)   . History of migraine     none since high school  . Arthritis   . Joint pain   . Joint swelling   . History of colon polyps     benign  . Other specified iron deficiency anemias 02/05/2013    2 iron infusions since gastric bypass;no abnormal reaction noted  . Anxiety   . S/P left unicompartmental knee replacement 08/05/2014    Past Surgical History  Procedure Laterality Date  . Arthroscopic repair acl Left 20 yrs ago  . Colectomy    . Gastric bypass    . Cholecystectomy    . Herniated bowel repair    . Knee arthroscopy Left   . Colonoscopy    . Partial knee arthroplasty Left 08/05/2014    Procedure: LEFT UNICOMPARTMENTAL KNEE;  Surgeon: Marianna Payment, MD;  Location: [place];  Service: Orthopedics;  Laterality: Left;    LMP 07/03/2014  Visit Diagnosis:  Left knee pain - Plan: PT plan of care cert/re-cert  Knee stiffness, left - Plan: PT plan of care cert/re-cert  Left leg weakness - Plan: PT plan of care cert/re-cert  Difficulty walking - Plan: PT plan of care cert/re-cert  Abnormality of gait - Plan: PT plan of care cert/re-cert  Edema - Plan: PT plan of care cert/re-cert      Subjective Assessment - 08/15/14 1102    Symptoms pt is s/p L unilateral knee replacement on 08/05/14 at [facility]. Following surgery pt spent night in hospital and was discharged following day without Home Health.  [name] here today for initial PT evaluation.   Currently in Pain? Yes   Pain Score --  L Knee pain ranges 0/10 at best (with Oxycodone), 2-3/10 on AVG, and 6/10 at worst.   Pain Radiating Towards pain extends L knee to L ankle.   Pain Onset 1 to 4 weeks ago   Pain Frequency Intermittent  nearly contant   Aggravating Factors  walking, a lot of movement, prolonged standing.   Pain Relieving Factors meds, ice   Multiple Pain Sites No          [facility] PT Assessment - 08/15/14 1100    Assessment   Medical Diagnosis s/p L partial knee replacement   Onset Date 08/05/14   Balance Screen   Has the patient fallen in the past 6 months No   Has the patient had a decrease in activity level because of a fear of falling?  No   Is the patient reluctant to leave their home because of a fear of falling?  No   Home Environment  Additional Comments Lives with Mother who is helping as needed   Prior Function   Vocation Full time employment  is out of work 6-8 weeks   Development worker, community, sitting and standing, some stairs   Leisure enjoys walking for exercise but has not been able lately, member of local gym   Observation/Other Assessments   Focus on Therapeutic Outcomes (FOTO)  59% limitation   ROM / Strength   AROM / PROM / Strength AROM;PROM;Strength   AROM   Overall AROM Comments L Knee 11-85   PROM   Overall PROM Comments L Knee 0-96   Strength   Overall Strength Comments R LE 5/5 other than Hip ER 4/5.  L Knee Ext 3-/5, Flexion 3+/5.  L Hip 4/5 grossly.   Ambulation/Gait   Gait Comments pt ambulating with appropriate use of single axillary crutch.  Gait is slow and L knee ROM is minimal throughout gait cycle.                  [facility] Adult PT Treatment/Exercise - 08/15/14 1100    Exercises    Exercises Knee/Hip   Knee/Hip Exercises: Supine   Quad Sets 10 reps;Left  5"   Short Arc Quad Sets Both;10 reps   Heel Slides Left;5 reps   Bridges Both;5 reps   Heel Prop for Knee Extension 1 minute   Straight Leg Raises Left;10 reps   Modalities   Modalities --  vasopneumatic compression L knee, low pressure, 38dg, 15'                PT Education - 08/15/14 1149    Education provided Yes   Education Details HEP   Person(s) Educated Patient   Methods Explanation;Demonstration;Handout   Comprehension Returned demonstration;Verbalized understanding          PT Short Term Goals - 08/15/14 1153    PT SHORT TERM GOAL #1   Title pt independent with initial HEP by 08/22/14   Status New           PT Long Term Goals - 08/15/14 1154    PT LONG TERM GOAL #1   Title pt independent with advanced HEP as necessary by 10/10/14   Status New   PT LONG TERM GOAL #2   Title pt able to ambulate with good mechanics without need for AD over level and uneven terrain by 10/10/14   Status New   PT LONG TERM GOAL #3   Title pt able to ascend/descend stairs with reciprocal gait and no greater than single rail assistance by 10/10/14   Status New   PT LONG TERM GOAL #4   Title L Knee AROM 0-130 by 10/10/14   Status New   PT LONG TERM GOAL #5   Title L LE MMT 4+/5 or better by 10/10/14   Status New               Plan - 08/15/14 1149    Clinical Impression Statement pt is 10 days s/p L partial knee replacement.  Current PROM 0-96 and AROM 11-85.  Pain fairly well controlled with ice and meds but limited tolerance with exercises today.  Gait is safe with single axillary crutch.  Surgical incision healing well without s/s infection at this time.   Pt will benefit from skilled therapeutic intervention in order to improve on the following deficits Abnormal gait;Decreased range of motion;Difficulty walking;Pain;Increased edema;Decreased strength;Decreased mobility;Decreased scar  mobility   Rehab Potential Good   PT Frequency 2x /  week   PT Duration --  6-8 wks   PT Treatment/Interventions Therapeutic exercise;Gait training;Balance training;Manual techniques;Therapeutic activities;Moist Heat;Electrical Stimulation;Cryotherapy;Stair training;Functional mobility training   PT Next Visit Plan progress ROM and gait, NuStep   Consulted and Agree with Plan of Care Patient         Problem List Patient Active Problem List   Diagnosis Date Noted  . Localized osteoarthritis of left knee 08/05/2014  . Status post left partial knee replacement 08/05/2014  . Other specified iron deficiency anemias 02/05/2013  . Gastric bypass status for obesity 02/05/2013    Hanin Decook PT, OCS 08/15/2014, 12:10 PM  [facility] [address]  [place], [place], [address] Phone: [phone]   Fax:  [phone]

## 2014-08-15 NOTE — Patient Instructions (Signed)
HEP: SAQ 15x, 5x/day QS 10x5", 5x/day SLR 10x, 5x/day Heel Slide 15x5", 5x/day Bridge 10x5", 5x/day Standing B Heel Raise 20x, 5x/day Knee Ext Stretch 3', 5x/day Seated Knee Flex overpressure stretch 10x5", 5x/day

## 2014-08-19 ENCOUNTER — Ambulatory Visit: Payer: 59 | Admitting: Rehabilitation

## 2014-08-19 DIAGNOSIS — R262 Difficulty in walking, not elsewhere classified: Secondary | ICD-10-CM

## 2014-08-19 DIAGNOSIS — R609 Edema, unspecified: Secondary | ICD-10-CM

## 2014-08-19 DIAGNOSIS — M25562 Pain in left knee: Secondary | ICD-10-CM | POA: Diagnosis not present

## 2014-08-19 DIAGNOSIS — M25662 Stiffness of left knee, not elsewhere classified: Secondary | ICD-10-CM

## 2014-08-19 DIAGNOSIS — R269 Unspecified abnormalities of gait and mobility: Secondary | ICD-10-CM

## 2014-08-19 DIAGNOSIS — R29898 Other symptoms and signs involving the musculoskeletal system: Secondary | ICD-10-CM

## 2014-08-19 NOTE — Therapy (Signed)
[facility] [address]  [place], [place], [address] Phone: [phone]   Fax:  [phone]  Physical Therapy Treatment  Patient Details  Name: Brandi Lutz MRN: [id number] Date of Birth: September 24, 1975 Referring Provider:  Leandrew Koyanagi, MD  Encounter Date: 08/19/2014      PT End of Session - 08/19/14 1535    Visit Number 2   Number of Visits 16   Date for PT Re-Evaluation 09/12/14   PT Start Time 3244   PT Stop Time 1630   PT Time Calculation (min) 59 min   Activity Tolerance Patient tolerated treatment well   Behavior During Therapy [facility] for tasks assessed/performed      Past Medical History  Diagnosis Date  . Gastric bypass status for obesity 02/05/2013  . Depression     takes Wellbutrin and Zoloft daily  . Cancer     colon  . PONV (postoperative nausea and vomiting)   . History of migraine     none since high school  . Arthritis   . Joint pain   . Joint swelling   . History of colon polyps     benign  . Other specified iron deficiency anemias 02/05/2013    2 iron infusions since gastric bypass;no abnormal reaction noted  . Anxiety   . S/P left unicompartmental knee replacement 08/05/2014    Past Surgical History  Procedure Laterality Date  . Arthroscopic repair acl Left 20 yrs ago  . Colectomy    . Gastric bypass    . Cholecystectomy    . Herniated bowel repair    . Knee arthroscopy Left   . Colonoscopy    . Partial knee arthroplasty Left 08/05/2014    Procedure: LEFT UNICOMPARTMENTAL KNEE;  Surgeon: Marianna Payment, MD;  Location: [place];  Service: Orthopedics;  Laterality: Left;    LMP 07/03/2014  Visit Diagnosis:  Left knee pain  Knee stiffness, left  Difficulty walking  Left leg weakness  Abnormality of gait  Edema      Subjective Assessment - 08/19/14 1536    Symptoms Feeling ok today. Didn't do too many exercises over the weekend. Saw the MD today who said the weakness  she feels in the back of her knee will resolve with therapy. MD also took her off blood thinners and told her to start using cream on her scar.    Currently in Pain? Yes   Pain Score 2    Pain Location Knee   Pain Orientation Left   Pain Descriptors / Indicators Sore   Pain Type Surgical pain   Pain Onset 1 to 4 weeks ago          [facility] PT Assessment - 08/19/14 1550    ROM / Strength   AROM / PROM / Strength AROM   AROM   AROM Assessment Site Knee   Right/Left Knee Left   Left Knee Flexion 94                  [facility] Adult PT Treatment/Exercise - 08/19/14 1539    Knee/Hip Exercises: Aerobic   Stationary Bike Rocking x3 minutes  Nustep level 3x5 minutes UE/LE   Knee/Hip Exercises: Standing   Heel Raises 15 reps  at UBE   Lateral Step Up 10 reps;Step Height: 6";Left  2 pole assist   Forward Step Up Left;10 reps;Step Height: 6"  2 pole assist   Wall Squat 10 reps  with pball  Other Standing Knee Exercises Marching on blue foam x10 2 pole assist   Knee/Hip Exercises: Supine   Quad Sets 10 reps;Left  5 second holds, black bolster   Short Arc Quad Sets 10 reps;Left  2# with 3 second hold   Heel Slides --  peanut ball tuck 3 second hold x10   Modalities   Modalities --  Vasopneumatic compression, low, 38 degrees x15' to Lt knee.    Manual Therapy   Manual Therapy Massage   Massage Scar massage with education                PT Education - 08/19/14 1617    Education provided Yes   Education Details Scar massage, lotion usage.    Person(s) Educated Patient   Methods Explanation;Demonstration   Comprehension Verbalized understanding;Returned demonstration          PT Short Term Goals - 08/15/14 1153    PT SHORT TERM GOAL #1   Title pt independent with initial HEP by 08/22/14   Status New           PT Long Term Goals - 08/15/14 1154    PT LONG TERM GOAL #1   Title pt independent with advanced HEP as necessary by 10/10/14   Status New   PT  LONG TERM GOAL #2   Title pt able to ambulate with good mechanics without need for AD over level and uneven terrain by 10/10/14   Status New   PT LONG TERM GOAL #3   Title pt able to ascend/descend stairs with reciprocal gait and no greater than single rail assistance by 10/10/14   Status New   PT LONG TERM GOAL #4   Title L Knee AROM 0-130 by 10/10/14   Status New   PT LONG TERM GOAL #5   Title L LE MMT 4+/5 or better by 10/10/14   Status New               Plan - 08/19/14 1618    Clinical Impression Statement Good tolerance to standing exercises. Pt was very cautious of step ups and wall squats but felt more comfortable the more reps she did. Education on scar massage due to MD clearing patient for lotion use on scar.    PT Next Visit Plan Continue ROM, gait, and strengthening.    Consulted and Agree with Plan of Care Patient        Problem List Patient Active Problem List   Diagnosis Date Noted  . Localized osteoarthritis of left knee 08/05/2014  . Status post left partial knee replacement 08/05/2014  . Other specified iron deficiency anemias 02/05/2013  . Gastric bypass status for obesity 02/05/2013    Barbette Hair, PTA 08/19/2014, 4:20 PM  [facility] [address]  [place], [place], [address] Phone: [phone]   Fax:  [phone]

## 2014-08-21 ENCOUNTER — Ambulatory Visit: Payer: 59 | Admitting: Physical Therapy

## 2014-08-21 ENCOUNTER — Encounter: Payer: Self-pay | Admitting: Physical Therapy

## 2014-08-21 DIAGNOSIS — M25562 Pain in left knee: Secondary | ICD-10-CM

## 2014-08-21 DIAGNOSIS — R262 Difficulty in walking, not elsewhere classified: Secondary | ICD-10-CM

## 2014-08-21 DIAGNOSIS — M25662 Stiffness of left knee, not elsewhere classified: Secondary | ICD-10-CM

## 2014-08-21 DIAGNOSIS — R29898 Other symptoms and signs involving the musculoskeletal system: Secondary | ICD-10-CM

## 2014-08-21 NOTE — Therapy (Signed)
[facility] [address]  [place], [place], [address] Phone: [phone]   Fax:  [phone]  Physical Therapy Treatment  Patient Details  Name: Brandi Lutz MRN: [id number] Date of Birth: 07/25/1975 Referring Provider:  Leandrew Koyanagi, MD  Encounter Date: 08/21/2014      PT End of Session - 08/21/14 1321    Visit Number 3   Number of Visits 16   Date for PT Re-Evaluation 09/12/14   PT Start Time 7342   PT Stop Time 1415   PT Time Calculation (min) 58 min      Past Medical History  Diagnosis Date  . Gastric bypass status for obesity 02/05/2013  . Depression     takes Wellbutrin and Zoloft daily  . Cancer     colon  . PONV (postoperative nausea and vomiting)   . History of migraine     none since high school  . Arthritis   . Joint pain   . Joint swelling   . History of colon polyps     benign  . Other specified iron deficiency anemias 02/05/2013    2 iron infusions since gastric bypass;no abnormal reaction noted  . Anxiety   . S/P left unicompartmental knee replacement 08/05/2014    Past Surgical History  Procedure Laterality Date  . Arthroscopic repair acl Left 20 yrs ago  . Colectomy    . Gastric bypass    . Cholecystectomy    . Herniated bowel repair    . Knee arthroscopy Left   . Colonoscopy    . Partial knee arthroplasty Left 08/05/2014    Procedure: LEFT UNICOMPARTMENTAL KNEE;  Surgeon: Marianna Payment, MD;  Location: [place];  Service: Orthopedics;  Laterality: Left;    LMP 07/03/2014  Visit Diagnosis:  Left leg weakness  Knee stiffness, left  Left knee pain  Difficulty walking      Subjective Assessment - 08/21/14 1319    Symptoms Took some tylenol this AM due to stiff/sore L knee.  Took nap after this and felt good upon waking.  Currently 2/10 at start of treatment.  States did "a little" of the HEP yesterday.   Currently in Pain? Yes   Pain Score 2    Pain Location Knee   Pain Orientation Left        TODAY'S TREATMENT TherEx - Rec Bike 5' partial to some full revolutions Bridge 10x; B SAQ 0# 10x, 2# 15x L SLR 2# 10x R Side-Lying L Hip ABD 2# 10x R Side-Lying L [place] TB 10x Machine B Knee Flexion 20# 2x10 Machine B Knee Extension 10# 2x10 L DF Rocker Stretch 3x20" TRX DL Squat 10x (hesitant at first but performed well and was able to achieve thighs parallel to floor on last rep) 6" FW Step-up with Single Pole A 15x  Vaso L Knee, Medium Pressure, 36 dg, 15'                      PT Short Term Goals - 08/21/14 1333    PT SHORT TERM GOAL #1   Title pt independent with initial HEP by 08/22/14   Status On-going           PT Long Term Goals - 08/21/14 1334    PT LONG TERM GOAL #1   Title pt independent with advanced HEP as necessary by 10/10/14   Status On-going   PT LONG TERM GOAL #2  Title pt able to ambulate with good mechanics without need for AD over level and uneven terrain by 10/10/14   Status On-going   PT LONG TERM GOAL #3   Title pt able to ascend/descend stairs with reciprocal gait and no greater than single rail assistance by 10/10/14   Status On-going   PT LONG TERM GOAL #4   Title L Knee AROM 0-130 by 10/10/14   Status On-going   PT LONG TERM GOAL #5   Title L LE MMT 4+/5 or better by 10/10/14   Status On-going               Problem List Patient Active Problem List   Diagnosis Date Noted  . Localized osteoarthritis of left knee 08/05/2014  . Status post left partial knee replacement 08/05/2014  . Other specified iron deficiency anemias 02/05/2013  . Gastric bypass status for obesity 02/05/2013    Leinaala Catanese PT, OCS 08/21/2014, 3:22 PM  [facility] [address]  [place], [place], [address] Phone: [phone]   Fax:  [phone]

## 2014-08-26 ENCOUNTER — Ambulatory Visit: Payer: 59 | Admitting: Physical Therapy

## 2014-08-26 DIAGNOSIS — R29898 Other symptoms and signs involving the musculoskeletal system: Secondary | ICD-10-CM

## 2014-08-26 DIAGNOSIS — R262 Difficulty in walking, not elsewhere classified: Secondary | ICD-10-CM

## 2014-08-26 DIAGNOSIS — M25662 Stiffness of left knee, not elsewhere classified: Secondary | ICD-10-CM

## 2014-08-26 DIAGNOSIS — M25562 Pain in left knee: Secondary | ICD-10-CM

## 2014-08-26 DIAGNOSIS — R609 Edema, unspecified: Secondary | ICD-10-CM

## 2014-08-26 NOTE — Therapy (Signed)
[facility] [address]  [place], [place], [address] Phone: [phone]   Fax:  [phone]  Physical Therapy Treatment  Patient Details  Name: Brandi Lutz MRN: [id number] Date of Birth: 1976/03/07 Referring Provider:  Leandrew Koyanagi, MD  Encounter Date: 08/26/2014      PT End of Session - 08/26/14 1407    Visit Number 4   Number of Visits 16   Date for PT Re-Evaluation 09/12/14   PT Start Time 84      Past Medical History  Diagnosis Date  . Gastric bypass status for obesity 02/05/2013  . Depression     takes Wellbutrin and Zoloft daily  . Cancer     colon  . PONV (postoperative nausea and vomiting)   . History of migraine     none since high school  . Arthritis   . Joint pain   . Joint swelling   . History of colon polyps     benign  . Other specified iron deficiency anemias 02/05/2013    2 iron infusions since gastric bypass;no abnormal reaction noted  . Anxiety   . S/P left unicompartmental knee replacement 08/05/2014    Past Surgical History  Procedure Laterality Date  . Arthroscopic repair acl Left 20 yrs ago  . Colectomy    . Gastric bypass    . Cholecystectomy    . Herniated bowel repair    . Knee arthroscopy Left   . Colonoscopy    . Partial knee arthroplasty Left 08/05/2014    Procedure: LEFT UNICOMPARTMENTAL KNEE;  Surgeon: Marianna Payment, MD;  Location: [facility];  Service: Orthopedics;  Laterality: Left;    There were no vitals filed for this visit.  Visit Diagnosis:  Left leg weakness  Left knee pain  Knee stiffness, left  Difficulty walking  Edema      Subjective Assessment - 08/26/14 1409    Symptoms states L knee pain currently 3/10.  States pain up to 5-6/10 over the weekend due to being out shopping over the weekend.  Required use of oxy for pain.   Currently in Pain? Yes   Pain Score 3    Pain Location Knee   Pain Orientation Left   Multiple Pain Sites No           TODAY'S TREATMENT TherEx - Rec Bike 5' partial to some full revolutions Bridge 15x; B SAQ 2# 15x L SLR 2# 15x R Side-Lying L Hip ABD 2# 10x (did not increase from last workout due to lateral knee pain) Machine B Knee Flexion 20# 2x13,15 Machine B Knee Extension 10# 2x13, 15 L DF Rocker Stretch 3x20" 6" Side step-up with single pole A 15x 8" FW Step-up with Single Pole A 15x 4" FW Stepdown with 3" reach and Single Pole A 10x  Manual - Grade 3 and some Grade 4 Flex and Ext mobes (PROM to 0-120 today was 0-96 at initial eval)  Vaso L Knee, Medium Pressure, 38 dg, 15'                       PT Short Term Goals - 08/21/14 1333    PT SHORT TERM GOAL #1   Title pt independent with initial HEP by 08/22/14   Status On-going           PT Long Term Goals - 08/21/14 1334    PT LONG TERM GOAL #1   Title pt  independent with advanced HEP as necessary by 10/10/14   Status On-going   PT LONG TERM GOAL #2   Title pt able to ambulate with good mechanics without need for AD over level and uneven terrain by 10/10/14   Status On-going   PT LONG TERM GOAL #3   Title pt able to ascend/descend stairs with reciprocal gait and no greater than single rail assistance by 10/10/14   Status On-going   PT LONG TERM GOAL #4   Title L Knee AROM 0-130 by 10/10/14   Status On-going   PT LONG TERM GOAL #5   Title L LE MMT 4+/5 or better by 10/10/14   Status On-going               Plan - 08/26/14 1437    Clinical Impression Statement tolerating progressions well, good PROM (0-120) today.  Poor eccentric control with step-downs and very hesitant with a lot of standing exercises due to fear of L knee weakness/buckling.   PT Next Visit Plan Continue ROM, gait, and strengthening.    Consulted and Agree with Plan of Care Patient        Problem List Patient Active Problem List   Diagnosis Date Noted  . Localized osteoarthritis of left knee 08/05/2014  . Status post  left partial knee replacement 08/05/2014  . Other specified iron deficiency anemias 02/05/2013  . Gastric bypass status for obesity 02/05/2013    Makiya Jeune PT, OCS 08/26/2014, 3:39 PM  [facility] [address]  [place], [place], [address] Phone: [phone]   Fax:  [phone]

## 2014-08-29 ENCOUNTER — Ambulatory Visit: Payer: 59 | Admitting: Rehabilitation

## 2014-08-29 DIAGNOSIS — R262 Difficulty in walking, not elsewhere classified: Secondary | ICD-10-CM

## 2014-08-29 DIAGNOSIS — M25562 Pain in left knee: Secondary | ICD-10-CM

## 2014-08-29 DIAGNOSIS — R29898 Other symptoms and signs involving the musculoskeletal system: Secondary | ICD-10-CM

## 2014-08-29 DIAGNOSIS — M25662 Stiffness of left knee, not elsewhere classified: Secondary | ICD-10-CM

## 2014-08-29 DIAGNOSIS — R269 Unspecified abnormalities of gait and mobility: Secondary | ICD-10-CM

## 2014-08-29 DIAGNOSIS — R609 Edema, unspecified: Secondary | ICD-10-CM

## 2014-08-29 NOTE — Therapy (Signed)
[facility] [address]  [place], [place], [address] Phone: [phone]   Fax:  [phone]  Physical Therapy Treatment  Patient Details  Name: Brandi Lutz MRN: [id number] Date of Birth: 1975-08-18 Referring Provider:  Leandrew Koyanagi, MD  Encounter Date: 08/29/2014      PT End of Session - 08/29/14 1107    Visit Number 5   Number of Visits 16   Date for PT Re-Evaluation 09/12/14   PT Start Time 1104   PT Stop Time 1204   PT Time Calculation (min) 60 min   Activity Tolerance Patient tolerated treatment well   Behavior During Therapy [facility] for tasks assessed/performed      Past Medical History  Diagnosis Date  . Gastric bypass status for obesity 02/05/2013  . Depression     takes Wellbutrin and Zoloft daily  . Cancer     colon  . PONV (postoperative nausea and vomiting)   . History of migraine     none since high school  . Arthritis   . Joint pain   . Joint swelling   . History of colon polyps     benign  . Other specified iron deficiency anemias 02/05/2013    2 iron infusions since gastric bypass;no abnormal reaction noted  . Anxiety   . S/P left unicompartmental knee replacement 08/05/2014    Past Surgical History  Procedure Laterality Date  . Arthroscopic repair acl Left 20 yrs ago  . Colectomy    . Gastric bypass    . Cholecystectomy    . Herniated bowel repair    . Knee arthroscopy Left   . Colonoscopy    . Partial knee arthroplasty Left 08/05/2014    Procedure: LEFT UNICOMPARTMENTAL KNEE;  Surgeon: Marianna Payment, MD;  Location: [place];  Service: Orthopedics;  Laterality: Left;    There were no vitals filed for this visit.  Visit Diagnosis:  Left leg weakness  Left knee pain  Knee stiffness, left  Difficulty walking  Edema  Abnormality of gait      Subjective Assessment - 08/29/14 1105    Symptoms Reports just stiffness right now but had some pain yesterday which was a  4/10. Might have been from walking in the backyard with the dogs. Notes some tenderness that goes down her leg and the swelling is still present.   Currently in Pain? No/denies      TODAY'S TREATMENT TherEx - Rec Bike 5' full revolutions Bridge with adduction squeeze x15 3 second hold; B SAQ 2# x15 with 5 second hold  8" FW step-up with 1 HHA on counter x15 BOSU (up) FW step-up with 1HHA on counter 3 second hold x15 BOSU (up) Lateral step-up with 2 HHA on counter 3 second hold x15  TKE with ball into wall 5 second hold x10 L leg press 10# x15 TRX DL squat x15 (no weightshift noted, thighs parallel to the ground) Seated Hamstring stretch 2x20"   AROM: 9-107 (3-4/10 with measuring)   Vaso L Knee, Medium Pressure, 38 dg, 15'            PT Short Term Goals - 08/21/14 1333    PT SHORT TERM GOAL #1   Title pt independent with initial HEP by 08/22/14   Status On-going           PT Long Term Goals - 08/21/14 1334    PT LONG TERM GOAL #1   Title pt independent  with advanced HEP as necessary by 10/10/14   Status On-going   PT LONG TERM GOAL #2   Title pt able to ambulate with good mechanics without need for AD over level and uneven terrain by 10/10/14   Status On-going   PT LONG TERM GOAL #3   Title pt able to ascend/descend stairs with reciprocal gait and no greater than single rail assistance by 10/10/14   Status On-going   PT LONG TERM GOAL #4   Title L Knee AROM 0-130 by 10/10/14   Status On-going   PT LONG TERM GOAL #5   Title L LE MMT 4+/5 or better by 10/10/14   Status On-going               Plan - 08/29/14 1150    Clinical Impression Statement Good progress with ROM, extension still at 9 but improved from 11 at eval. Patient reported knee felt fatigued at the end of treatment.    PT Next Visit Plan Continue ROM, gait, and strengthening.    Consulted and Agree with Plan of Care Patient        Problem List Patient Active Problem List   Diagnosis Date  Noted  . Localized osteoarthritis of left knee 08/05/2014  . Status post left partial knee replacement 08/05/2014  . Other specified iron deficiency anemias 02/05/2013  . Gastric bypass status for obesity 02/05/2013    Barbette Hair, PTA 08/29/2014, 11:51 AM  [facility] [address]  [place], [place], [address] Phone: [phone]   Fax:  [phone]

## 2014-09-02 ENCOUNTER — Ambulatory Visit: Payer: 59 | Admitting: Physical Therapy

## 2014-09-02 DIAGNOSIS — M25562 Pain in left knee: Secondary | ICD-10-CM

## 2014-09-02 DIAGNOSIS — M25662 Stiffness of left knee, not elsewhere classified: Secondary | ICD-10-CM

## 2014-09-02 DIAGNOSIS — R29898 Other symptoms and signs involving the musculoskeletal system: Secondary | ICD-10-CM

## 2014-09-02 DIAGNOSIS — R262 Difficulty in walking, not elsewhere classified: Secondary | ICD-10-CM

## 2014-09-02 NOTE — Therapy (Signed)
[facility] [address]  [place], [place], [address] Phone: [phone]   Fax:  [phone]  Physical Therapy Treatment  Patient Details  Name: Brandi Lutz MRN: [id number] Date of Birth: 05/24/1976 Referring Provider:  Leandrew Koyanagi, MD  Encounter Date: 09/02/2014      PT End of Session - 09/02/14 1116    Visit Number 6   Number of Visits 16   Date for PT Re-Evaluation 09/12/14   PT Start Time 1110   PT Stop Time 1205   PT Time Calculation (min) 55 min      Past Medical History  Diagnosis Date  . Gastric bypass status for obesity 02/05/2013  . Depression     takes Wellbutrin and Zoloft daily  . Cancer     colon  . PONV (postoperative nausea and vomiting)   . History of migraine     none since high school  . Arthritis   . Joint pain   . Joint swelling   . History of colon polyps     benign  . Other specified iron deficiency anemias 02/05/2013    2 iron infusions since gastric bypass;no abnormal reaction noted  . Anxiety   . S/P left unicompartmental knee replacement 08/05/2014    Past Surgical History  Procedure Laterality Date  . Arthroscopic repair acl Left 20 yrs ago  . Colectomy    . Gastric bypass    . Cholecystectomy    . Herniated bowel repair    . Knee arthroscopy Left   . Colonoscopy    . Partial knee arthroplasty Left 08/05/2014    Procedure: LEFT UNICOMPARTMENTAL KNEE;  Surgeon: Marianna Payment, MD;  Location: [place];  Service: Orthopedics;  Laterality: Left;    There were no vitals filed for this visit.  Visit Diagnosis:  Left leg weakness  Left knee pain  Difficulty walking  Knee stiffness, left      Subjective Assessment - 09/02/14 1114    Symptoms Worst pain 4/10 lately and on average 2-3/10.  States seems to get fatigued a lot. Is performing HEP "sometimes"   Currently in Pain? Yes   Pain Score 2    Pain Location Knee   Pain Orientation Left   Multiple Pain  Sites No           TODAY'S TREATMENT TherEx - Rec Bike 5' full revolutions but no resistance Bridge 10x FOB (55cm) Tuck 10x, FOB (55cm) Bridge 10x Seated Fitter 2 Blue L Leg Press 15x, 1 Black + 1 Blue 15x Knee Flexion Machine 20#, 15x (B concentric, L eccentric) Knee Ext Machine 20# 15x B 4" Stepover with Single Pole A 10x 8" L FW Step-up 15x with single pole A L SLS Rotation Stab with Green TB 10x3" (very difficult but no c/o increased pain)  Vaso L Knee, Medium Pressure, 38 dg, 15'                       PT Short Term Goals - 08/21/14 1333    PT SHORT TERM GOAL #1   Title pt independent with initial HEP by 08/22/14   Status On-going           PT Long Term Goals - 08/21/14 1334    PT LONG TERM GOAL #1   Title pt independent with advanced HEP as necessary by 10/10/14   Status On-going   PT LONG TERM GOAL #2   Title  pt able to ambulate with good mechanics without need for AD over level and uneven terrain by 10/10/14   Status On-going   PT LONG TERM GOAL #3   Title pt able to ascend/descend stairs with reciprocal gait and no greater than single rail assistance by 10/10/14   Status On-going   PT LONG TERM GOAL #4   Title L Knee AROM 0-130 by 10/10/14   Status On-going   PT LONG TERM GOAL #5   Title L LE MMT 4+/5 or better by 10/10/14   Status On-going               Plan - 09/02/14 1154    Clinical Impression Statement progressing well, no changes needed   PT Next Visit Plan Continue ROM, gait, and strengthening.         Problem List Patient Active Problem List   Diagnosis Date Noted  . Localized osteoarthritis of left knee 08/05/2014  . Status post left partial knee replacement 08/05/2014  . Other specified iron deficiency anemias 02/05/2013  . Gastric bypass status for obesity 02/05/2013    Ivyonna Hoelzel PT, OCS 09/02/2014, 12:11 PM  [facility] [address]  [place], [place], [address] Phone: [phone]   Fax:  [phone]

## 2014-09-05 ENCOUNTER — Ambulatory Visit: Payer: 59 | Admitting: Rehabilitation

## 2014-09-05 DIAGNOSIS — M25562 Pain in left knee: Secondary | ICD-10-CM

## 2014-09-05 DIAGNOSIS — R269 Unspecified abnormalities of gait and mobility: Secondary | ICD-10-CM

## 2014-09-05 DIAGNOSIS — R29898 Other symptoms and signs involving the musculoskeletal system: Secondary | ICD-10-CM

## 2014-09-05 DIAGNOSIS — M25662 Stiffness of left knee, not elsewhere classified: Secondary | ICD-10-CM

## 2014-09-05 DIAGNOSIS — R609 Edema, unspecified: Secondary | ICD-10-CM

## 2014-09-05 DIAGNOSIS — R262 Difficulty in walking, not elsewhere classified: Secondary | ICD-10-CM

## 2014-09-05 NOTE — Therapy (Signed)
[facility] [address]  [place], [place], [address] Phone: [phone]   Fax:  [phone]  Physical Therapy Treatment  Patient Details  Name: Brandi Lutz MRN: [id number] Date of Birth: March 31, 1976 Referring Provider:  Leandrew Koyanagi, MD  Encounter Date: 09/05/2014      PT End of Session - 09/05/14 1107    Visit Number 7   Number of Visits 16   Date for PT Re-Evaluation 09/12/14   PT Start Time 1103   PT Stop Time 1141   PT Time Calculation (min) 38 min   Activity Tolerance Patient tolerated treatment well   Behavior During Therapy [facility] for tasks assessed/performed      Past Medical History  Diagnosis Date  . Gastric bypass status for obesity 02/05/2013  . Depression     takes Wellbutrin and Zoloft daily  . Cancer     colon  . PONV (postoperative nausea and vomiting)   . History of migraine     none since high school  . Arthritis   . Joint pain   . Joint swelling   . History of colon polyps     benign  . Other specified iron deficiency anemias 02/05/2013    2 iron infusions since gastric bypass;no abnormal reaction noted  . Anxiety   . S/P left unicompartmental knee replacement 08/05/2014    Past Surgical History  Procedure Laterality Date  . Arthroscopic repair acl Left 20 yrs ago  . Colectomy    . Gastric bypass    . Cholecystectomy    . Herniated bowel repair    . Knee arthroscopy Left   . Colonoscopy    . Partial knee arthroplasty Left 08/05/2014    Procedure: LEFT UNICOMPARTMENTAL KNEE;  Surgeon: Marianna Payment, MD;  Location: [facility];  Service: Orthopedics;  Laterality: Left;    There were no vitals filed for this visit.  Visit Diagnosis:  Left leg weakness  Left knee pain  Knee stiffness, left  Difficulty walking  Edema  Abnormality of gait      Subjective Assessment - 09/05/14 1106    Symptoms No pain today but some discomfort near posterior knee with bike. Says she  is now doing her HEP consistantly.   Currently in Pain? No/denies      TODAY'S TREATMENT TherEx - Rec Bike 5' full revolutions but no resistance 8" L FW Step-up 15x with single pole A 6" Eccentric reachdowns with Single Pole A 10x B Knee Flex Machine 20# 15x, 25# 10x then B concentric, L eccentric 20# 15x Lt Leg press 15# 15x BOSU (up) FW step-up/SLS 5 second hold x10 Bridge with adduction squeeze 5 second hold x15 B SAQ 3# x10 with 5 second hold  AROM: 8-110 (3-4/10 with measuring)        PT Short Term Goals - 08/21/14 1333    PT SHORT TERM GOAL #1   Title pt independent with initial HEP by 08/22/14   Status On-going           PT Long Term Goals - 08/21/14 1334    PT LONG TERM GOAL #1   Title pt independent with advanced HEP as necessary by 10/10/14   Status On-going   PT LONG TERM GOAL #2   Title pt able to ambulate with good mechanics without need for AD over level and uneven terrain by 10/10/14   Status On-going   PT LONG TERM GOAL #3   Title  pt able to ascend/descend stairs with reciprocal gait and no greater than single rail assistance by 10/10/14   Status On-going   PT LONG TERM GOAL #4   Title L Knee AROM 0-130 by 10/10/14   Status On-going   PT LONG TERM GOAL #5   Title L LE MMT 4+/5 or better by 10/10/14   Status On-going               Plan - 09/05/14 1140    Clinical Impression Statement Good tolerance with improved motion noted today. Extension is coming along slowly.    PT Next Visit Plan Continue strengthening, ROM, gait   Consulted and Agree with Plan of Care Patient        Problem List Patient Active Problem List   Diagnosis Date Noted  . Localized osteoarthritis of left knee 08/05/2014  . Status post left partial knee replacement 08/05/2014  . Other specified iron deficiency anemias 02/05/2013  . Gastric bypass status for obesity 02/05/2013    Barbette Hair, PTA 09/05/2014, 11:41 AM  [facility] [address]  [place], [place], [address] Phone: [phone]   Fax:  [phone]

## 2014-09-09 ENCOUNTER — Ambulatory Visit: Payer: 59 | Admitting: Physical Therapy

## 2014-09-12 ENCOUNTER — Ambulatory Visit: Payer: 59 | Admitting: Rehabilitation

## 2014-09-12 DIAGNOSIS — R269 Unspecified abnormalities of gait and mobility: Secondary | ICD-10-CM

## 2014-09-12 DIAGNOSIS — R29898 Other symptoms and signs involving the musculoskeletal system: Secondary | ICD-10-CM

## 2014-09-12 DIAGNOSIS — R262 Difficulty in walking, not elsewhere classified: Secondary | ICD-10-CM

## 2014-09-12 DIAGNOSIS — M25662 Stiffness of left knee, not elsewhere classified: Secondary | ICD-10-CM

## 2014-09-12 DIAGNOSIS — M25562 Pain in left knee: Secondary | ICD-10-CM

## 2014-09-12 DIAGNOSIS — R609 Edema, unspecified: Secondary | ICD-10-CM

## 2014-09-12 NOTE — Therapy (Signed)
[facility] [address]  [place], [place], [address] Phone: [phone]   Fax:  [phone]  Physical Therapy Treatment  Patient Details  Name: Brandi Lutz MRN: [id number] Date of Birth: Aug 15, 1975 Referring Provider:  Leandrew Koyanagi, MD  Encounter Date: 09/12/2014      PT End of Session - 09/12/14 1101    Visit Number 8   Number of Visits 16   Date for PT Re-Evaluation 09/12/14   PT Start Time 1100   PT Stop Time 1154   PT Time Calculation (min) 54 min   Activity Tolerance Patient tolerated treatment well   Behavior During Therapy [facility] for tasks assessed/performed      Past Medical History  Diagnosis Date  . Gastric bypass status for obesity 02/05/2013  . Depression     takes Wellbutrin and Zoloft daily  . Cancer     colon  . PONV (postoperative nausea and vomiting)   . History of migraine     none since high school  . Arthritis   . Joint pain   . Joint swelling   . History of colon polyps     benign  . Other specified iron deficiency anemias 02/05/2013    2 iron infusions since gastric bypass;no abnormal reaction noted  . Anxiety   . S/P left unicompartmental knee replacement 08/05/2014    Past Surgical History  Procedure Laterality Date  . Arthroscopic repair acl Left 20 yrs ago  . Colectomy    . Gastric bypass    . Cholecystectomy    . Herniated bowel repair    . Knee arthroscopy Left   . Colonoscopy    . Partial knee arthroplasty Left 08/05/2014    Procedure: LEFT UNICOMPARTMENTAL KNEE;  Surgeon: Marianna Payment, MD;  Location: [facility];  Service: Orthopedics;  Laterality: Left;    There were no vitals filed for this visit.  Visit Diagnosis:  Left leg weakness  Left knee pain  Knee stiffness, left  Difficulty walking  Edema  Abnormality of gait      Subjective Assessment - 09/12/14 1104    Symptoms Reports no pain, just knee fatigue after walking around some and going  up/down stairs. Notes some swelling near posterior knee/hamstring. Most pain occurs while lying in bed at night.    Currently in Pain? No/denies   Aggravating Factors  Walking, stairs, laying down at night.       TODAY'S TREATMENT TherEx - Rec Bike level 2 x5\' 6"  Eccentric reachdowns with Single Pole A 2x10 Standing Fitter 1 black/1 blue FW lunge x10, BW lunge x10 Lt only, 2 poles assist BOSU (up) FW mini lunges x10 each alternating 2 pole assist L SLS Rotation Stab with Green TB 10x3" (very difficult but no c/o increased pain) Standing TKE ball into wall 5" hold x12 Lt Leg Press 20# 2x10 Knee Ext Machine 20# 2x15 B  Vaso L Knee, Medium Pressure, 38 dg, 15'          PT Short Term Goals - 08/21/14 1333    PT SHORT TERM GOAL #1   Title pt independent with initial HEP by 08/22/14   Status On-going           PT Long Term Goals - 08/21/14 1334    PT LONG TERM GOAL #1   Title pt independent with advanced HEP as necessary by 10/10/14   Status On-going   PT LONG TERM GOAL #2  Title pt able to ambulate with good mechanics without need for AD over level and uneven terrain by 10/10/14   Status On-going   PT LONG TERM GOAL #3   Title pt able to ascend/descend stairs with reciprocal gait and no greater than single rail assistance by 10/10/14   Status On-going   PT LONG TERM GOAL #4   Title L Knee AROM 0-130 by 10/10/14   Status On-going   PT LONG TERM GOAL #5   Title L LE MMT 4+/5 or better by 10/10/14   Status On-going               Plan - 09/12/14 1140    Clinical Impression Statement Still very difficult with SLS rhythmic stab exercise and standing fitter exercises.    PT Next Visit Plan Continue strengthening, ROM, gait   Consulted and Agree with Plan of Care Patient        Problem List Patient Active Problem List   Diagnosis Date Noted  . Localized osteoarthritis of left knee 08/05/2014  . Status post left partial knee replacement 08/05/2014  . Other  specified iron deficiency anemias 02/05/2013  . Gastric bypass status for obesity 02/05/2013    Barbette Hair, PTA 09/12/2014, 11:41 AM  [facility] [address]  [place], [place], [address] Phone: [phone]   Fax:  [phone]

## 2014-09-16 ENCOUNTER — Ambulatory Visit: Payer: 59 | Attending: Orthopaedic Surgery | Admitting: Physical Therapy

## 2014-09-16 DIAGNOSIS — M25569 Pain in unspecified knee: Secondary | ICD-10-CM | POA: Insufficient documentation

## 2014-09-16 DIAGNOSIS — R262 Difficulty in walking, not elsewhere classified: Secondary | ICD-10-CM

## 2014-09-16 DIAGNOSIS — M25562 Pain in left knee: Secondary | ICD-10-CM

## 2014-09-16 DIAGNOSIS — Z5189 Encounter for other specified aftercare: Secondary | ICD-10-CM | POA: Insufficient documentation

## 2014-09-16 DIAGNOSIS — R29898 Other symptoms and signs involving the musculoskeletal system: Secondary | ICD-10-CM

## 2014-09-16 DIAGNOSIS — M25662 Stiffness of left knee, not elsewhere classified: Secondary | ICD-10-CM

## 2014-09-16 NOTE — Therapy (Signed)
[facility] [address]  [place], [place], [address] Phone: [phone]   Fax:  [phone]  Physical Therapy Treatment  Patient Details  Name: Brandi Lutz MRN: [id number] Date of Birth: [birth year] Referring Provider:  Leandrew Koyanagi, MD  Encounter Date: 09/16/2014      PT End of Session - 09/16/14 1124    Visit Number 9   Number of Visits 16   Date for PT Re-Evaluation 09/12/14   PT Start Time 1110   PT Stop Time 1203   PT Time Calculation (min) 53 min      Past Medical History  Diagnosis Date  . Gastric bypass status for obesity 02/05/2013  . Depression     takes Wellbutrin and Zoloft daily  . Cancer     Lutz  . PONV (postoperative nausea and vomiting)   . History of migraine     none since high school  . Arthritis   . Joint pain   . Joint swelling   . History of Lutz polyps     benign  . Other specified iron deficiency anemias 02/05/2013    2 iron infusions since gastric bypass;no abnormal reaction noted  . Anxiety   . S/P left unicompartmental knee replacement 08/05/2014    Past Surgical History  Procedure Laterality Date  . Arthroscopic repair acl Left 20 yrs ago  . Colectomy    . Gastric bypass    . Cholecystectomy    . Herniated bowel repair    . Knee arthroscopy Left   . Colonoscopy    . Partial knee arthroplasty Left 08/05/2014    Procedure: LEFT UNICOMPARTMENTAL KNEE;  Surgeon: Marianna Payment, MD;  Location: [facility];  Service: Orthopedics;  Laterality: Left;    There were no vitals filed for this visit.  Visit Diagnosis:  Left leg weakness  Knee stiffness, left  Difficulty walking  Left knee pain      Subjective Assessment - 09/16/14 1116    Subjective states has not had much pain lately and she is hoping to return to work at the 7 week mark rather than waiting for the full 8 weeks as initially recommended by MD.  States really only noting pain with stairs lately.    Currently in Pain? No/denies   Multiple Pain Sites No          TODAY'S TREATMENT TherEx - Rec Bike lvl 2 5' DF Rocker Stretch 3x20" each Knee Flexion Machine 25# B concentric, L eccentric 15x Knee Ext Machine 25# 15x B the 10# L 10x Standing TKE Black TB 15x3" TRX L SL Squat L SL Deadlift 5# 10x (difficult, no pain) 9" side step-up 15x with single pole A Standing Fitter 1 black/1 blue FW lunge 15x with B pole A  Vaso L Knee, Medium Pressure, 36 dg, 15'                        PT Short Term Goals - 08/21/14 1333    PT SHORT TERM GOAL #1   Title pt independent with initial HEP by 08/22/14   Status On-going           PT Long Term Goals - 08/21/14 1334    PT LONG TERM GOAL #1   Title pt independent with advanced HEP as necessary by 10/10/14   Status On-going   PT LONG TERM GOAL #2   Title pt able to ambulate with good  mechanics without need for AD over level and uneven terrain by 10/10/14   Status On-going   PT LONG TERM GOAL #3   Title pt able to ascend/descend stairs with reciprocal gait and no greater than single rail assistance by 10/10/14   Status On-going   PT LONG TERM GOAL #4   Title L Knee AROM 0-130 by 10/10/14   Status On-going   PT LONG TERM GOAL #5   Title L LE MMT 4+/5 or better by 10/10/14   Status On-going               Plan - 09/16/14 1158    Clinical Impression Statement excellent progress, seems likely ready for d/c next week.  slight TKE lag noted and difficulty with eccentric quads but otherwise looking very good.   PT Next Visit Plan Continue strengthening, ROM, gait   Consulted and Agree with Plan of Care Patient        Problem List Patient Active Problem List   Diagnosis Date Noted  . Localized osteoarthritis of left knee 08/05/2014  . Status post left partial knee replacement 08/05/2014  . Other specified iron deficiency anemias 02/05/2013  . Gastric bypass status for obesity 02/05/2013    Mckenzi Buonomo PT,  OCS 09/16/2014, 12:05 PM  [facility] [address]  [place], [place], [address] Phone: [phone]   Fax:  [phone]

## 2014-09-19 ENCOUNTER — Ambulatory Visit: Payer: 59 | Admitting: Rehabilitation

## 2014-09-19 DIAGNOSIS — R609 Edema, unspecified: Secondary | ICD-10-CM

## 2014-09-19 DIAGNOSIS — M25662 Stiffness of left knee, not elsewhere classified: Secondary | ICD-10-CM

## 2014-09-19 DIAGNOSIS — M25562 Pain in left knee: Secondary | ICD-10-CM

## 2014-09-19 DIAGNOSIS — R29898 Other symptoms and signs involving the musculoskeletal system: Secondary | ICD-10-CM

## 2014-09-19 DIAGNOSIS — R269 Unspecified abnormalities of gait and mobility: Secondary | ICD-10-CM

## 2014-09-19 DIAGNOSIS — R262 Difficulty in walking, not elsewhere classified: Secondary | ICD-10-CM

## 2014-09-19 DIAGNOSIS — Z5189 Encounter for other specified aftercare: Secondary | ICD-10-CM | POA: Diagnosis not present

## 2014-09-19 NOTE — Therapy (Signed)
[facility] [address]  [place], [place], [address] Phone: [phone]   Fax:  [phone]  Physical Therapy Treatment  Patient Details  Name: Brandi Lutz MRN: [id number] Date of Birth: 04/20/1976 Referring Provider:  Leandrew Koyanagi, MD  Encounter Date: 09/19/2014      PT End of Session - 09/19/14 1109    Visit Number 10   Number of Visits 16   Date for PT Re-Evaluation 10/10/14   PT Start Time 1102   PT Stop Time 1156   PT Time Calculation (min) 54 min   Activity Tolerance Patient tolerated treatment well   Behavior During Therapy [facility] for tasks assessed/performed      Past Medical History  Diagnosis Date  . Gastric bypass status for obesity 02/05/2013  . Depression     takes Wellbutrin and Zoloft daily  . Cancer     colon  . PONV (postoperative nausea and vomiting)   . History of migraine     none since high school  . Arthritis   . Joint pain   . Joint swelling   . History of colon polyps     benign  . Other specified iron deficiency anemias 02/05/2013    2 iron infusions since gastric bypass;no abnormal reaction noted  . Anxiety   . S/P left unicompartmental knee replacement 08/05/2014    Past Surgical History  Procedure Laterality Date  . Arthroscopic repair acl Left 20 yrs ago  . Colectomy    . Gastric bypass    . Cholecystectomy    . Herniated bowel repair    . Knee arthroscopy Left   . Colonoscopy    . Partial knee arthroplasty Left 08/05/2014    Procedure: LEFT UNICOMPARTMENTAL KNEE;  Surgeon: Marianna Payment, MD;  Location: [facility] [facility];  Service: Orthopedics;  Laterality: Left;    There were no vitals filed for this visit.  Visit Diagnosis:  Left leg weakness  Knee stiffness, left  Difficulty walking  Left knee pain  Edema  Abnormality of gait      Subjective Assessment - 09/19/14 1105    Subjective Reports jarring her knee on Monday and has pain/difficulty walking since  then. Notes improvement today in that she can walk but still has pain. Also stated that her knee swelled up the past few days especially in the back of the knee. Pain increases with certain movements such has turning or twisting. Reports pain reached a 10/10 when she fell/jarred her knee but it has not been that bad since then.    Currently in Pain? Yes   Pain Score 2   5/10 this morning when first getting up.    Pain Location Knee   Pain Orientation Left   Pain Descriptors / Indicators Sore   Pain Type Surgical pain            [facility] PT Assessment - 09/19/14 1110    ROM / Strength   AROM / PROM / Strength AROM;Strength   AROM   AROM Assessment Site Knee   Right/Left Knee Left   Left Knee Extension 9   Left Knee Flexion 114   Strength   Strength Assessment Site Knee   Right/Left Knee Left   Left Knee Flexion 4-/5  pain   Left Knee Extension 4+/5          TODAY'S TREATMENT TherEx - Rec Bike lvl 1-2 5' ROM/MMT/MD note DF Rocker Stretch 3x20" each TRX DL  squats 15x, partial SL L squat 10x Standing TKE ball against wall 10x5" Knee Flexion Machine 25# B concentric, L eccentric 15x 9" side step-up 15x with single pole A  Vaso L Knee, Medium Pressure, 38 dg, 15'             PT Short Term Goals - 08/21/14 1333    PT SHORT TERM GOAL #1   Title pt independent with initial HEP by 08/22/14   Status On-going           PT Long Term Goals - 08/21/14 1334    PT LONG TERM GOAL #1   Title pt independent with advanced HEP as necessary by 10/10/14   Status On-going   PT LONG TERM GOAL #2   Title pt able to ambulate with good mechanics without need for AD over level and uneven terrain by 10/10/14   Status On-going   PT LONG TERM GOAL #3   Title pt able to ascend/descend stairs with reciprocal gait and no greater than single rail assistance by 10/10/14   Status On-going   PT LONG TERM GOAL #4   Title L Knee AROM 0-130 by 10/10/14   Status On-going   PT LONG TERM GOAL  #5   Title L LE MMT 4+/5 or better by 10/10/14   Status On-going               Plan - 09/19/14 1141    Clinical Impression Statement Great ROM today and improved strength noted. Lighter exercise today due to increased pain but pt still tolerated well. Great improvements to date.     PT Next Visit Plan Continue strengthening, ROM, gait   Consulted and Agree with Plan of Care Patient        Problem List Patient Active Problem List   Diagnosis Date Noted  . Localized osteoarthritis of left knee 08/05/2014  . Status post left partial knee replacement 08/05/2014  . Other specified iron deficiency anemias 02/05/2013  . Gastric bypass status for obesity 02/05/2013    Barbette Hair, PTA 09/19/2014, 11:54 AM  [facility] [address]  [place], [place], [address] Phone: [phone]   Fax:  [phone]

## 2014-09-23 ENCOUNTER — Ambulatory Visit: Payer: 59

## 2014-09-24 ENCOUNTER — Ambulatory Visit: Payer: 59 | Admitting: Rehabilitation

## 2014-09-24 DIAGNOSIS — R609 Edema, unspecified: Secondary | ICD-10-CM

## 2014-09-24 DIAGNOSIS — Z5189 Encounter for other specified aftercare: Secondary | ICD-10-CM | POA: Diagnosis not present

## 2014-09-24 DIAGNOSIS — R269 Unspecified abnormalities of gait and mobility: Secondary | ICD-10-CM

## 2014-09-24 DIAGNOSIS — M25662 Stiffness of left knee, not elsewhere classified: Secondary | ICD-10-CM

## 2014-09-24 DIAGNOSIS — M25562 Pain in left knee: Secondary | ICD-10-CM

## 2014-09-24 DIAGNOSIS — R262 Difficulty in walking, not elsewhere classified: Secondary | ICD-10-CM

## 2014-09-24 DIAGNOSIS — R29898 Other symptoms and signs involving the musculoskeletal system: Secondary | ICD-10-CM

## 2014-09-24 NOTE — Therapy (Signed)
[facility] [address]  [place], [place], [address] Phone: [phone]   Fax:  [phone]  Physical Therapy Treatment  Patient Details  Name: Brandi Lutz MRN: [id number] Date of Birth: [birth year] Referring Provider:  Leandrew Koyanagi, MD  Encounter Date: 09/24/2014      PT End of Session - 09/24/14 1437    Visit Number 11   Number of Visits 16   Date for PT Re-Evaluation 10/10/14   PT Start Time 4008   PT Stop Time 1443   PT Time Calculation (min) 36 min      Past Medical History  Diagnosis Date  . Gastric bypass status for obesity 02/05/2013  . Depression     takes Wellbutrin and Zoloft daily  . Cancer     colon  . PONV (postoperative nausea and vomiting)   . History of migraine     none since high school  . Arthritis   . Joint pain   . Joint swelling   . History of colon polyps     benign  . Other specified iron deficiency anemias 02/05/2013    2 iron infusions since gastric bypass;no abnormal reaction noted  . Anxiety   . S/P left unicompartmental knee replacement 08/05/2014    Past Surgical History  Procedure Laterality Date  . Arthroscopic repair acl Left 20 yrs ago  . Colectomy    . Gastric bypass    . Cholecystectomy    . Herniated bowel repair    . Knee arthroscopy Left   . Colonoscopy    . Partial knee arthroplasty Left 08/05/2014    Procedure: LEFT UNICOMPARTMENTAL KNEE;  Surgeon: Marianna Payment, MD;  Location: [facility];  Service: Orthopedics;  Laterality: Left;    There were no vitals filed for this visit.  Visit Diagnosis:  Left leg weakness  Knee stiffness, left  Difficulty walking  Left knee pain  Edema  Abnormality of gait      Subjective Assessment - 09/24/14 1410    Subjective Saw MD last week and he prescribed more therapy with focus on strengthening and balance. Returns to work next Monday. Notes stiffness and pain first thing in the morning and before she  goes to bed .    Currently in Pain? Yes   Pain Score --  1-2/10   Pain Location Knee   Pain Orientation Left;Posterior      TODAY'S TREATMENT TherEx - Rec Bike lvl 2 5' SLS on blue disk 10-15" x5 SLS on blue disk then Rt LE tap cones (3 in semi circle) x6  Squats on BOSU (down) x20 DF Rocker Stretch 20"x3 SL leg press 25# 15x2 Knee Flexion Machine 20# B concentric, L eccentric 15x Standing TKE ball against wall 10x5"          PT Short Term Goals - 08/21/14 1333    PT SHORT TERM GOAL #1   Title pt independent with initial HEP by 08/22/14   Status On-going           PT Long Term Goals - 08/21/14 1334    PT LONG TERM GOAL #1   Title pt independent with advanced HEP as necessary by 10/10/14   Status On-going   PT LONG TERM GOAL #2   Title pt able to ambulate with good mechanics without need for AD over level and uneven terrain by 10/10/14   Status On-going   PT LONG TERM GOAL #3   Title  pt able to ascend/descend stairs with reciprocal gait and no greater than single rail assistance by 10/10/14   Status On-going   PT LONG TERM GOAL #4   Title L Knee AROM 0-130 by 10/10/14   Status On-going   PT LONG TERM GOAL #5   Title L LE MMT 4+/5 or better by 10/10/14   Status On-going               Plan - 09/24/14 1446    Clinical Impression Statement Difficulty noted with balance activities today and increased knee fatigue.    PT Next Visit Plan Continue balance, strengthening, ROM   Consulted and Agree with Plan of Care Patient        Problem List Patient Active Problem List   Diagnosis Date Noted  . Localized osteoarthritis of left knee 08/05/2014  . Status post left partial knee replacement 08/05/2014  . Other specified iron deficiency anemias 02/05/2013  . Gastric bypass status for obesity 02/05/2013    Barbette Hair, PTA 09/24/2014, 2:47 PM  [facility] [address]  [place],  [place], [address] Phone: [phone]   Fax:  [phone]

## 2014-09-25 ENCOUNTER — Ambulatory Visit: Payer: 59 | Admitting: Physical Therapy

## 2014-09-25 DIAGNOSIS — R29898 Other symptoms and signs involving the musculoskeletal system: Secondary | ICD-10-CM

## 2014-09-25 DIAGNOSIS — M25662 Stiffness of left knee, not elsewhere classified: Secondary | ICD-10-CM

## 2014-09-25 DIAGNOSIS — R262 Difficulty in walking, not elsewhere classified: Secondary | ICD-10-CM

## 2014-09-25 DIAGNOSIS — Z5189 Encounter for other specified aftercare: Secondary | ICD-10-CM | POA: Diagnosis not present

## 2014-09-25 NOTE — Therapy (Signed)
[facility] [address]  [place], [place], [address] Phone: [phone]   Fax:  [phone]  Physical Therapy Treatment  Patient Details  Name: Brandi Lutz MRN: [id number] Date of Birth: [birth year] Referring Provider:  Leandrew Koyanagi, MD  Encounter Date: 09/25/2014      PT End of Session - 09/25/14 1401    Visit Number 12   Number of Visits 16   Date for PT Re-Evaluation 10/10/14   PT Start Time 1400   PT Stop Time 7989   PT Time Calculation (min) 47 min      Past Medical History  Diagnosis Date  . Gastric bypass status for obesity 02/05/2013  . Depression     takes Wellbutrin and Zoloft daily  . Cancer     colon  . PONV (postoperative nausea and vomiting)   . History of migraine     none since high school  . Arthritis   . Joint pain   . Joint swelling   . History of colon polyps     benign  . Other specified iron deficiency anemias 02/05/2013    2 iron infusions since gastric bypass;no abnormal reaction noted  . Anxiety   . S/P left unicompartmental knee replacement 08/05/2014    Past Surgical History  Procedure Laterality Date  . Arthroscopic repair acl Left 20 yrs ago  . Colectomy    . Gastric bypass    . Cholecystectomy    . Herniated bowel repair    . Knee arthroscopy Left   . Colonoscopy    . Partial knee arthroplasty Left 08/05/2014    Procedure: LEFT UNICOMPARTMENTAL KNEE;  Surgeon: Marianna Payment, MD;  Location: [facility];  Service: Orthopedics;  Laterality: Left;    There were no vitals filed for this visit.  Visit Diagnosis:  Left leg weakness  Difficulty walking  Knee stiffness, left      Subjective Assessment - 09/25/14 1403    Subjective "not pain, just tender/sore in back of knee", states she feels as though she has fully recovered from increased pain following fall last week.   Currently in Pain? No/denies            [facility] PT Assessment - 09/25/14 0001    Strength   Strength Assessment Site Hip   Right/Left Hip Left   Left Hip Flexion 4+/5   Left Hip Extension 4/5   Left Hip External Rotation  --  4-/5   Left Hip Internal Rotation  --  4+/5   Left Hip ABduction 4/5   Left Hip ADduction 4/5      TODAY'S TREATMENT TherEx - Rec Bike lvl 1-2, 5' QS into towel roll 6x5", SAQ 3# 15x Hip MMT Assessment Side-Lying Clam Black TB 2x10 Side Step-up 6" 15x, 8" 15 both with light single pole A B Knee Extension 25# 15x, L Knee Ext 10# 10x Standing Hip ABD and Flexion with Double Green TB 12x each with Single Pole A                       PT Short Term Goals - 09/25/14 1451    PT SHORT TERM GOAL #1   Title pt independent with initial HEP by 08/22/14   Status Achieved           PT Long Term Goals - 09/25/14 1451    PT LONG TERM GOAL #1   Title pt independent with advanced  HEP as necessary by 10/10/14   Status On-going   PT LONG TERM GOAL #2   Title pt able to ambulate with good mechanics without need for AD over level and uneven terrain by 10/10/14   Status On-going   PT LONG TERM GOAL #3   Title pt able to ascend/descend stairs with reciprocal gait and no greater than single rail assistance by 10/10/14   Status On-going   PT LONG TERM GOAL #4   Title L Knee AROM 0-130 by 10/10/14   Status On-going   PT LONG TERM GOAL #5   Title L LE MMT 4+/5 or better by 10/10/14   Status On-going               Plan - 09/25/14 1447    Clinical Impression Statement significant weakness throughout L hip (especially ER) but otherwise is progressing very well.  updated HEP to add some ER strengthening, prone knee flexion, and step-up for TKE.   PT Next Visit Plan Continue balance, strengthening, hip stability, ROM   Consulted and Agree with Plan of Care Patient        Problem List Patient Active Problem List   Diagnosis Date Noted  . Localized osteoarthritis of left knee 08/05/2014  . Status post left partial knee  replacement 08/05/2014  . Other specified iron deficiency anemias 02/05/2013  . Gastric bypass status for obesity 02/05/2013    Issa Kosmicki PT, OCS 09/25/2014, 2:52 PM  [facility] [address]  [place], [place], [address] Phone: [phone]   Fax:  [phone]

## 2014-09-26 ENCOUNTER — Ambulatory Visit: Payer: 59 | Admitting: Rehabilitation

## 2014-10-02 ENCOUNTER — Ambulatory Visit: Payer: 59 | Admitting: Rehabilitation

## 2014-10-02 DIAGNOSIS — M25562 Pain in left knee: Secondary | ICD-10-CM

## 2014-10-02 DIAGNOSIS — Z5189 Encounter for other specified aftercare: Secondary | ICD-10-CM | POA: Diagnosis not present

## 2014-10-02 DIAGNOSIS — R269 Unspecified abnormalities of gait and mobility: Secondary | ICD-10-CM

## 2014-10-02 DIAGNOSIS — R262 Difficulty in walking, not elsewhere classified: Secondary | ICD-10-CM

## 2014-10-02 DIAGNOSIS — R29898 Other symptoms and signs involving the musculoskeletal system: Secondary | ICD-10-CM

## 2014-10-02 DIAGNOSIS — M25662 Stiffness of left knee, not elsewhere classified: Secondary | ICD-10-CM

## 2014-10-02 DIAGNOSIS — R609 Edema, unspecified: Secondary | ICD-10-CM

## 2014-10-02 NOTE — Therapy (Signed)
[facility] [address]  [place], [place], [address] Phone: [phone]   Fax:  [phone]  Physical Therapy Treatment  Patient Details  Name: Brandi Lutz MRN: [id number] Date of Birth: 11-17-1975 Referring Provider:  Leandrew Koyanagi, MD  Encounter Date: 10/02/2014      PT End of Session - 10/02/14 1621    Visit Number 13   Number of Visits 16   Date for PT Re-Evaluation 10/10/14   PT Start Time 1616   PT Stop Time 1700   PT Time Calculation (min) 44 min      Past Medical History  Diagnosis Date  . Gastric bypass status for obesity 02/05/2013  . Depression     takes Wellbutrin and Zoloft daily  . Cancer     colon  . PONV (postoperative nausea and vomiting)   . History of migraine     none since high school  . Arthritis   . Joint pain   . Joint swelling   . History of colon polyps     benign  . Other specified iron deficiency anemias 02/05/2013    2 iron infusions since gastric bypass;no abnormal reaction noted  . Anxiety   . S/P left unicompartmental knee replacement 08/05/2014    Past Surgical History  Procedure Laterality Date  . Arthroscopic repair acl Left 20 yrs ago  . Colectomy    . Gastric bypass    . Cholecystectomy    . Herniated bowel repair    . Knee arthroscopy Left   . Colonoscopy    . Partial knee arthroplasty Left 08/05/2014    Procedure: LEFT UNICOMPARTMENTAL KNEE;  Surgeon: Marianna Payment, MD;  Location: [facility];  Service: Orthopedics;  Laterality: Left;    There were no vitals filed for this visit.  Visit Diagnosis:  Left leg weakness  Difficulty walking  Knee stiffness, left  Left knee pain  Edema  Abnormality of gait      Subjective Assessment - 10/02/14 1618    Subjective Returned to work this week and notices her leg gets tired through the day. Feels knee is stiff. "Very tired from work."   Currently in Pain? No/denies      TODAY'S TREATMENT TherEx  - Rec Bike lvl 2, 6' Side Step ups 8" 15x (light single pole assist) 8" Fwd step ups 15x  6" Eccentric reach downs Lt 15x (single pole assist) Lt Knee Ext 10# 10x2  BOSU (down) step up/SLS 15x5" (2 pole assist) BOSU (down) squats 15x (2 pole assist) SAQ 3# 15x        PT Short Term Goals - 09/25/14 1451    PT SHORT TERM GOAL #1   Title pt independent with initial HEP by 08/22/14   Status Achieved           PT Long Term Goals - 09/25/14 1451    PT LONG TERM GOAL #1   Title pt independent with advanced HEP as necessary by 10/10/14   Status On-going   PT LONG TERM GOAL #2   Title pt able to ambulate with good mechanics without need for AD over level and uneven terrain by 10/10/14   Status On-going   PT LONG TERM GOAL #3   Title pt able to ascend/descend stairs with reciprocal gait and no greater than single rail assistance by 10/10/14   Status On-going   PT LONG TERM GOAL #4   Title L Knee AROM 0-130 by  10/10/14   Status On-going   PT LONG TERM GOAL #5   Title L LE MMT 4+/5 or better by 10/10/14   Status On-going               Plan - 10/02/14 1706    Clinical Impression Statement Still noted quad weakness but good tolerance to all exercises.    PT Next Visit Plan Continue balance, strengthening, hip stability, ROM   Consulted and Agree with Plan of Care Patient        Problem List Patient Active Problem List   Diagnosis Date Noted  . Localized osteoarthritis of left knee 08/05/2014  . Status post left partial knee replacement 08/05/2014  . Other specified iron deficiency anemias 02/05/2013  . Gastric bypass status for obesity 02/05/2013    Barbette Hair, PTA 10/02/2014, 5:07 PM  [facility] [address]  [place], [place], [address] Phone: [phone]   Fax:  [phone]

## 2014-10-03 ENCOUNTER — Ambulatory Visit: Payer: 59 | Admitting: Physical Therapy

## 2014-10-08 ENCOUNTER — Ambulatory Visit: Payer: 59 | Admitting: Rehabilitation

## 2014-10-10 ENCOUNTER — Ambulatory Visit: Payer: 59 | Admitting: Physical Therapy

## 2014-10-10 DIAGNOSIS — Z5189 Encounter for other specified aftercare: Secondary | ICD-10-CM | POA: Diagnosis not present

## 2014-10-10 DIAGNOSIS — R262 Difficulty in walking, not elsewhere classified: Secondary | ICD-10-CM

## 2014-10-10 DIAGNOSIS — M25662 Stiffness of left knee, not elsewhere classified: Secondary | ICD-10-CM

## 2014-10-10 DIAGNOSIS — R29898 Other symptoms and signs involving the musculoskeletal system: Secondary | ICD-10-CM

## 2014-10-10 NOTE — Therapy (Signed)
[facility] [address]  [place], [place], [address] Phone: [phone]   Fax:  [phone]  Physical Therapy Treatment  Patient Details  Name: Brandi Lutz MRN: [id number] Date of Birth: [birth year] Referring Provider:  Leandrew Koyanagi, MD  Encounter Date: 10/10/2014      PT End of Session - 10/10/14 1712    Visit Number 14   Number of Visits 16   PT Start Time 1324   PT Stop Time 4010   PT Time Calculation (min) 43 min      Past Medical History  Diagnosis Date  . Gastric bypass status for obesity 02/05/2013  . Depression     takes Wellbutrin and Zoloft daily  . Cancer     colon  . PONV (postoperative nausea and vomiting)   . History of migraine     none since high school  . Arthritis   . Joint pain   . Joint swelling   . History of colon polyps     benign  . Other specified iron deficiency anemias 02/05/2013    2 iron infusions since gastric bypass;no abnormal reaction noted  . Anxiety   . S/P left unicompartmental knee replacement 08/05/2014    Past Surgical History  Procedure Laterality Date  . Arthroscopic repair acl Left 20 yrs ago  . Colectomy    . Gastric bypass    . Cholecystectomy    . Herniated bowel repair    . Knee arthroscopy Left   . Colonoscopy    . Partial knee arthroplasty Left 08/05/2014    Procedure: LEFT UNICOMPARTMENTAL KNEE;  Surgeon: Marianna Payment, MD;  Location: [facility];  Service: Orthopedics;  Laterality: Left;    There were no vitals filed for this visit.  Visit Diagnosis:  Left leg weakness  Difficulty walking  Knee stiffness, left      Subjective Assessment - 10/10/14 1709    Subjective No pain but feels fatigued with work stating has had difficulty transitioning back to work.  Missed 2 PT sessions last week due to illness.  States tries to use stairs at work some.   Currently in Pain? No/denies         TODAY'S TREATMENT TherEx - Rec Bike lvl 2,  5' Bridge 15x Bridge with Black TB at knees 15x B SAQ 5# 15x Bridge with Alt Knee Ext 10x Side-Lying Hip ADD 2# 12x, ABD 2# 12x B Knee Extension 25# 15x B Knee Flexion 35# 15x Side Step-up 8" 15x with light single pole A                        PT Short Term Goals - 09/25/14 1451    PT SHORT TERM GOAL #1   Title pt independent with initial HEP by 08/22/14   Status Achieved           PT Long Term Goals - 09/25/14 1451    PT LONG TERM GOAL #1   Title pt independent with advanced HEP as necessary by 10/10/14   Status On-going   PT LONG TERM GOAL #2   Title pt able to ambulate with good mechanics without need for AD over level and uneven terrain by 10/10/14   Status On-going   PT LONG TERM GOAL #3   Title pt able to ascend/descend stairs with reciprocal gait and no greater than single rail assistance by 10/10/14   Status On-going   PT  LONG TERM GOAL #4   Title L Knee AROM 0-130 by 10/10/14   Status On-going   PT LONG TERM GOAL #5   Title L LE MMT 4+/5 or better by 10/10/14   Status On-going               Plan - 10/10/14 1755    Clinical Impression Statement pt fatigued from returning to work but still performing well with exercise, no pain just weakness in hips/knee.  Should be ready for d/c to HEP within next 1-2 visits.   PT Next Visit Plan Continue balance, strengthening, hip stability, ROM   Consulted and Agree with Plan of Care Patient        Problem List Patient Active Problem List   Diagnosis Date Noted  . Localized osteoarthritis of left knee 08/05/2014  . Status post left partial knee replacement 08/05/2014  . Other specified iron deficiency anemias 02/05/2013  . Gastric bypass status for obesity 02/05/2013    Tahlia Deamer PT, OCS 10/10/2014, 5:56 PM  [facility] [address]  [place], [place], [address] Phone: [phone]   Fax:  [phone]

## 2014-10-16 ENCOUNTER — Ambulatory Visit: Payer: 59 | Admitting: Rehabilitation

## 2014-10-17 ENCOUNTER — Ambulatory Visit: Payer: 59 | Admitting: Physical Therapy

## 2014-10-23 ENCOUNTER — Ambulatory Visit: Payer: 59 | Attending: Orthopaedic Surgery | Admitting: Rehabilitation

## 2014-10-23 DIAGNOSIS — M25569 Pain in unspecified knee: Secondary | ICD-10-CM | POA: Diagnosis not present

## 2014-10-23 DIAGNOSIS — Z5189 Encounter for other specified aftercare: Secondary | ICD-10-CM | POA: Diagnosis present

## 2014-10-23 DIAGNOSIS — R29898 Other symptoms and signs involving the musculoskeletal system: Secondary | ICD-10-CM

## 2014-10-23 DIAGNOSIS — R609 Edema, unspecified: Secondary | ICD-10-CM

## 2014-10-23 DIAGNOSIS — M25662 Stiffness of left knee, not elsewhere classified: Secondary | ICD-10-CM

## 2014-10-23 DIAGNOSIS — R269 Unspecified abnormalities of gait and mobility: Secondary | ICD-10-CM

## 2014-10-23 DIAGNOSIS — R262 Difficulty in walking, not elsewhere classified: Secondary | ICD-10-CM

## 2014-10-23 DIAGNOSIS — M25562 Pain in left knee: Secondary | ICD-10-CM

## 2014-10-23 NOTE — Therapy (Signed)
[facility] [address]  [place], [place], [address] Phone: [phone]   Fax:  [phone]  Physical Therapy Treatment  Patient Details  Name: Brandi Lutz MRN: [id number] Date of Birth: 1975-11-01 Referring Provider:  Leandrew Koyanagi, MD  Encounter Date: 10/23/2014      PT End of Session - 10/23/14 1744    Visit Number 15   Number of Visits 16   Date for PT Re-Evaluation 10/10/14   PT Start Time 1700   PT Stop Time 1740   PT Time Calculation (min) 40 min   Activity Tolerance Patient limited by pain      Past Medical History  Diagnosis Date  . Gastric bypass status for obesity 02/05/2013  . Depression     takes Wellbutrin and Zoloft daily  . Cancer     colon  . PONV (postoperative nausea and vomiting)   . History of migraine     none since high school  . Arthritis   . Joint pain   . Joint swelling   . History of colon polyps     benign  . Other specified iron deficiency anemias 02/05/2013    2 iron infusions since gastric bypass;no abnormal reaction noted  . Anxiety   . S/P left unicompartmental knee replacement 08/05/2014    Past Surgical History  Procedure Laterality Date  . Arthroscopic repair acl Left 20 yrs ago  . Colectomy    . Gastric bypass    . Cholecystectomy    . Herniated bowel repair    . Knee arthroscopy Left   . Colonoscopy    . Partial knee arthroplasty Left 08/05/2014    Procedure: LEFT UNICOMPARTMENTAL KNEE;  Surgeon: Marianna Payment, MD;  Location: [facility];  Service: Orthopedics;  Laterality: Left;    There were no vitals filed for this visit.  Visit Diagnosis:  Left leg weakness  Difficulty walking  Knee stiffness, left  Left knee pain  Edema  Abnormality of gait      Subjective Assessment - 10/23/14 1700    Subjective Reports she think she might have tweeked her knee. States that it doesn't hurt but feels discomfort and fatigued. Says in the mornings she  feels like she has to assist her Lt knee for about an hour before it feels strong enough to walk on. Also thinks that her gait is off, and that she is dragging her Lt leg while walking. Pain with going from flex-ext and ext-flex. Pt reports no falls or slips.    Currently in Pain? No/denies  just discomfort        TODAY'S TREATMENT TherEx - Rec Bike lvl 2, 6' Side Step-up 8" 15x with light pole assist BOSU (up) 10x5" Step-up with single pole assist Bridge with Ball Squeeze 15x5"  Manual- STM to medial knee, medial and inferior patellar mobs, tibial AP/PA's Grade 2-3.         PT Short Term Goals - 09/25/14 1451    PT SHORT TERM GOAL #1   Title pt independent with initial HEP by 08/22/14   Status Achieved           PT Long Term Goals - 09/25/14 1451    PT LONG TERM GOAL #1   Title pt independent with advanced HEP as necessary by 10/10/14   Status On-going   PT LONG TERM GOAL #2   Title pt able to ambulate with good mechanics without need for AD over  level and uneven terrain by 10/10/14   Status On-going   PT LONG TERM GOAL #3   Title pt able to ascend/descend stairs with reciprocal gait and no greater than single rail assistance by 10/10/14   Status On-going   PT LONG TERM GOAL #4   Title L Knee AROM 0-130 by 10/10/14   Status On-going   PT LONG TERM GOAL #5   Title L LE MMT 4+/5 or better by 10/10/14   Status On-going               Plan - 10/23/14 1745    Clinical Impression Statement Limited tolerance due to Lt medial knee pain. Pt reported that she is going to call her doctor due to this pain been going on for 2 weeks. Very TTP along medial knee.   PT Next Visit Plan Continue balance, strengthening, hip stability, ROM   Consulted and Agree with Plan of Care Patient        Problem List Patient Active Problem List   Diagnosis Date Noted  . Localized osteoarthritis of left knee 08/05/2014  . Status post left partial knee replacement 08/05/2014  . Other  specified iron deficiency anemias 02/05/2013  . Gastric bypass status for obesity 02/05/2013    Barbette Hair, PTA 10/23/2014, 5:53 PM  [facility] [address]  [place], [place], [address] Phone: [phone]   Fax:  [phone]

## 2014-10-24 ENCOUNTER — Ambulatory Visit: Payer: 59 | Admitting: Rehabilitation

## 2014-11-06 ENCOUNTER — Ambulatory Visit: Payer: 59 | Admitting: Rehabilitation

## 2014-11-06 DIAGNOSIS — R269 Unspecified abnormalities of gait and mobility: Secondary | ICD-10-CM

## 2014-11-06 DIAGNOSIS — R262 Difficulty in walking, not elsewhere classified: Secondary | ICD-10-CM

## 2014-11-06 DIAGNOSIS — R29898 Other symptoms and signs involving the musculoskeletal system: Secondary | ICD-10-CM

## 2014-11-06 DIAGNOSIS — M25662 Stiffness of left knee, not elsewhere classified: Secondary | ICD-10-CM

## 2014-11-06 DIAGNOSIS — R609 Edema, unspecified: Secondary | ICD-10-CM

## 2014-11-06 DIAGNOSIS — M25562 Pain in left knee: Secondary | ICD-10-CM

## 2014-11-06 DIAGNOSIS — Z5189 Encounter for other specified aftercare: Secondary | ICD-10-CM | POA: Diagnosis not present

## 2014-11-06 NOTE — Therapy (Signed)
[facility] [address]  [place], [place], [address] Phone: [phone]   Fax:  [phone]  Physical Therapy Treatment  Patient Details  Name: Brandi Lutz MRN: [id number] Date of Birth: 09-10-1975 Referring Provider:  Benito Mccreedy, MD  Encounter Date: 11/06/2014      PT End of Session - 11/06/14 1710    Visit Number 16   Number of Visits 16   Date for PT Re-Evaluation 10/10/14   PT Start Time 1618   PT Stop Time 1705   PT Time Calculation (min) 47 min   Behavior During Therapy [facility] for tasks assessed/performed      Past Medical History  Diagnosis Date  . Gastric bypass status for obesity 02/05/2013  . Depression     takes Wellbutrin and Zoloft daily  . Cancer     colon  . PONV (postoperative nausea and vomiting)   . History of migraine     none since high school  . Arthritis   . Joint pain   . Joint swelling   . History of colon polyps     benign  . Other specified iron deficiency anemias 02/05/2013    2 iron infusions since gastric bypass;no abnormal reaction noted  . Anxiety   . S/P left unicompartmental knee replacement 08/05/2014    Past Surgical History  Procedure Laterality Date  . Arthroscopic repair acl Left 20 yrs ago  . Colectomy    . Gastric bypass    . Cholecystectomy    . Herniated bowel repair    . Knee arthroscopy Left   . Colonoscopy    . Partial knee arthroplasty Left 08/05/2014    Procedure: LEFT UNICOMPARTMENTAL KNEE;  Surgeon: Marianna Payment, MD;  Location: [facility];  Service: Orthopedics;  Laterality: Left;    There were no vitals filed for this visit.  Visit Diagnosis:  Left leg weakness  Difficulty walking  Knee stiffness, left  Left knee pain  Edema  Abnormality of gait      Subjective Assessment - 11/06/14 1620    Subjective Reports Dr gave her an injection which has helped a lot. Dr seems to think it was her hamstring giving her problems. Notes  no pain currently but pain can reach a 4/10 and at best 0-1/10. Still has problems first thing in the morning with her first few steps.    Currently in Pain? No/denies   Pain Location --  medial knee            [facility] PT Assessment - 11/06/14 1626    AROM   AROM Assessment Site Knee   Right/Left Knee Left   Left Knee Extension 9   Left Knee Flexion 112     TODAY'S TREATMENT TherEx - Rec Bike lvl 1, 5' Bridge 15x Rt Side-lying Rt Hip adduction 2# 10x Lt Side-lying Rt Hip abduction 2# 10x  8" FW Step-up BOSU (up) FW Step-up x10 then Lateral Step-ups x10 Bil Knee Flexion 35# 15x Bil Knee Extension 25# 15x  TKE with Ball against wall 5"x15       PT Short Term Goals - 09/25/14 1451    PT SHORT TERM GOAL #1   Title pt independent with initial HEP by 08/22/14   Status Achieved           PT Long Term Goals - 09/25/14 1451    PT LONG TERM GOAL #1   Title pt independent with advanced HEP as necessary  by 10/10/14   Status On-going   PT LONG TERM GOAL #2   Title pt able to ambulate with good mechanics without need for AD over level and uneven terrain by 10/10/14   Status On-going   PT LONG TERM GOAL #3   Title pt able to ascend/descend stairs with reciprocal gait and no greater than single rail assistance by 10/10/14   Status On-going   PT LONG TERM GOAL #4   Title L Knee AROM 0-130 by 10/10/14   Status On-going   PT LONG TERM GOAL #5   Title L LE MMT 4+/5 or better by 10/10/14   Status On-going               Plan - 11/06/14 1614    Clinical Impression Statement Improved tolerance to exercise today. Noted medial knee pain with SAQ and bridging exercises. Will have PT check when pt returns for renewal.    PT Next Visit Plan Renewal next visit. Continue per pt tolerance.    Consulted and Agree with Plan of Care Patient        Problem List Patient Active Problem List   Diagnosis Date Noted  . Localized osteoarthritis of left knee 08/05/2014  . Status post  left partial knee replacement 08/05/2014  . Other specified iron deficiency anemias 02/05/2013  . Gastric bypass status for obesity 02/05/2013    Barbette Hair, PTA 11/06/2014, 5:14 PM  [facility] [address]  [place], [place], [address] Phone: [phone]   Fax:  [phone]

## 2014-11-07 ENCOUNTER — Ambulatory Visit: Payer: 59 | Admitting: Rehabilitation

## 2014-11-07 DIAGNOSIS — R609 Edema, unspecified: Secondary | ICD-10-CM

## 2014-11-07 DIAGNOSIS — R262 Difficulty in walking, not elsewhere classified: Secondary | ICD-10-CM

## 2014-11-07 DIAGNOSIS — M25562 Pain in left knee: Secondary | ICD-10-CM

## 2014-11-07 DIAGNOSIS — R269 Unspecified abnormalities of gait and mobility: Secondary | ICD-10-CM

## 2014-11-07 DIAGNOSIS — Z5189 Encounter for other specified aftercare: Secondary | ICD-10-CM | POA: Diagnosis not present

## 2014-11-07 DIAGNOSIS — M25662 Stiffness of left knee, not elsewhere classified: Secondary | ICD-10-CM

## 2014-11-07 DIAGNOSIS — R29898 Other symptoms and signs involving the musculoskeletal system: Secondary | ICD-10-CM

## 2014-11-07 NOTE — Therapy (Signed)
[facility] [address]  [place], [place], [address] Phone: [phone]   Fax:  [phone]  Physical Therapy Treatment  Patient Details  Name: Brandi Lutz MRN: [id number] Date of Birth: Oct 13, 1975 Referring Provider:  Leandrew Koyanagi, MD  Encounter Date: 11/07/2014      PT End of Session - 11/07/14 1614    Visit Number 17   Number of Visits 22   Date for PT Re-Evaluation 11/29/14   PT Start Time 1615   PT Stop Time 1710   PT Time Calculation (min) 55 min      Past Medical History  Diagnosis Date  . Gastric bypass status for obesity 02/05/2013  . Depression     takes Wellbutrin and Zoloft daily  . Cancer     colon  . PONV (postoperative nausea and vomiting)   . History of migraine     none since high school  . Arthritis   . Joint pain   . Joint swelling   . History of colon polyps     benign  . Other specified iron deficiency anemias 02/05/2013    2 iron infusions since gastric bypass;no abnormal reaction noted  . Anxiety   . S/P left unicompartmental knee replacement 08/05/2014    Past Surgical History  Procedure Laterality Date  . Arthroscopic repair acl Left 20 yrs ago  . Colectomy    . Gastric bypass    . Cholecystectomy    . Herniated bowel repair    . Knee arthroscopy Left   . Colonoscopy    . Partial knee arthroplasty Left 08/05/2014    Procedure: LEFT UNICOMPARTMENTAL KNEE;  Surgeon: Marianna Payment, MD;  Location: [facility];  Service: Orthopedics;  Laterality: Left;    There were no vitals filed for this visit.  Visit Diagnosis:  Left leg weakness  Difficulty walking  Knee stiffness, left  Left knee pain  Edema  Abnormality of gait      Subjective Assessment - 11/07/14 1618    Subjective States she noticed some pain last night and a little this morning. Pain at best is 0-1/10, and at worst is about a 6/10. Feels 60-65% back to normal, states she doesn't trust it yet.  Still has problems getting out of bed, going from sitting to standing position after a long time, and endurance walking.      How long can you walk comfortably? Not limited.    Currently in Pain? Yes   Pain Score 1    Pain Location Knee   Pain Orientation Left;Medial   Aggravating Factors  Activity, standing, sitting or laying down for a long time.    Pain Relieving Factors ice, rest.             [facility] PT Assessment - 11/07/14 1620    AROM   AROM Assessment Site Knee   Right/Left Knee Left   Left Knee Extension 9   Left Knee Flexion 112   Strength   Strength Assessment Site Hip;Knee   Right/Left Hip Left   Left Hip Flexion --  5-/5   Left Hip Extension 4+/5   Left Hip External Rotation  --  4-/5 (medial knee pain)   Left Hip Internal Rotation  --  4+/5   Left Hip ABduction --  5-/5   Left Hip ADduction 4+/5   Right/Left Knee Left   Left Knee Flexion 4-/5  lateral and medial knee pain   Left Knee  Extension 4+/5  medial knee pain      TODAY'S TREATMENT TherEx - Rec Bike lvl 1, 5' MMT/ROM Bridge 15x Sl Bridge 10x each DL Leg press 25# x15, SL leg press 20# x15 8" FW Step-up x15 (no assist) with 3-5" hold for balance 8" Lateral Step-up x15 (no assist) with 3-5" hold for balance Bil SAQ 5# 15x5"       PT Short Term Goals - 09/25/14 1451    PT SHORT TERM GOAL #1   Title pt independent with initial HEP by 08/22/14   Status Achieved           PT Long Term Goals - 09/25/14 1451    PT LONG TERM GOAL #1   Title pt independent with advanced HEP as necessary by 10/10/14   Status On-going   PT LONG TERM GOAL #2   Title pt able to ambulate with good mechanics without need for AD over level and uneven terrain by 10/10/14   Status On-going   PT LONG TERM GOAL #3   Title pt able to ascend/descend stairs with reciprocal gait and no greater than single rail assistance by 10/10/14   Status On-going   PT LONG TERM GOAL #4   Title L Knee AROM 0-130 by 10/10/14   Status  On-going   PT LONG TERM GOAL #5   Title L LE MMT 4+/5 or better by 10/10/14   Status On-going               Plan - 11/07/14 1707    Clinical Impression Statement Good improvements noted with hip MMTing but still limited by medial knee pain. No report of pain with standing exercise but did not difficulty with balance exercises. Pt recieved MD orders to continue with PT on 10/23/14 at which time she had completed 14 treatments.  From that time we will add 2x/wk for 4wk for a total of up to 22 treatments then d/c to HEP.   PT Next Visit Plan Will continue with POC per MD referral recieved 511/16; assess LTG status and make comments noting her status   Consulted and Agree with Plan of Care Patient        Problem List Patient Active Problem List   Diagnosis Date Noted  . Localized osteoarthritis of left knee 08/05/2014  . Status post left partial knee replacement 08/05/2014  . Other specified iron deficiency anemias 02/05/2013  . Gastric bypass status for obesity 02/05/2013    Barbette Hair, PTA  11/07/2014, 5:11 PM  Leonette Most PT, OCS 11/08/2014 9:54 AM   [facility] [address]  [place], [place], [address] Phone: [phone]   Fax:  [phone]

## 2014-11-20 ENCOUNTER — Ambulatory Visit: Payer: 59 | Admitting: Rehabilitation

## 2014-11-21 ENCOUNTER — Ambulatory Visit: Payer: 59 | Admitting: Physical Therapy

## 2014-11-25 ENCOUNTER — Ambulatory Visit: Payer: 59 | Attending: Orthopaedic Surgery | Admitting: Rehabilitation

## 2014-11-25 DIAGNOSIS — R609 Edema, unspecified: Secondary | ICD-10-CM | POA: Insufficient documentation

## 2014-11-25 DIAGNOSIS — M25662 Stiffness of left knee, not elsewhere classified: Secondary | ICD-10-CM | POA: Diagnosis present

## 2014-11-25 DIAGNOSIS — R269 Unspecified abnormalities of gait and mobility: Secondary | ICD-10-CM | POA: Diagnosis present

## 2014-11-25 DIAGNOSIS — M25562 Pain in left knee: Secondary | ICD-10-CM | POA: Insufficient documentation

## 2014-11-25 DIAGNOSIS — R29898 Other symptoms and signs involving the musculoskeletal system: Secondary | ICD-10-CM | POA: Insufficient documentation

## 2014-11-25 DIAGNOSIS — R262 Difficulty in walking, not elsewhere classified: Secondary | ICD-10-CM | POA: Insufficient documentation

## 2014-11-25 NOTE — Therapy (Signed)
[facility] [address]  [place], [place], [address] Phone: [phone]   Fax:  [phone]  Physical Therapy Treatment  Patient Details  Name: Brandi Lutz MRN: [id number] Date of Birth: Apr 07, 1976 Referring Provider:  Leandrew Koyanagi, MD  Encounter Date: 11/25/2014      PT End of Session - 11/25/14 1703    Visit Number 18   Number of Visits 22   Date for PT Re-Evaluation 11/29/14   PT Start Time 2426   PT Stop Time 1747   PT Time Calculation (min) 43 min   Activity Tolerance Patient tolerated treatment well   Behavior During Therapy [facility] for tasks assessed/performed      Past Medical History  Diagnosis Date  . Gastric bypass status for obesity 02/05/2013  . Depression     takes Wellbutrin and Zoloft daily  . Cancer     colon  . PONV (postoperative nausea and vomiting)   . History of migraine     none since high school  . Arthritis   . Joint pain   . Joint swelling   . History of colon polyps     benign  . Other specified iron deficiency anemias 02/05/2013    2 iron infusions since gastric bypass;no abnormal reaction noted  . Anxiety   . S/P left unicompartmental knee replacement 08/05/2014    Past Surgical History  Procedure Laterality Date  . Arthroscopic repair acl Left 20 yrs ago  . Colectomy    . Gastric bypass    . Cholecystectomy    . Herniated bowel repair    . Knee arthroscopy Left   . Colonoscopy    . Partial knee arthroplasty Left 08/05/2014    Procedure: LEFT UNICOMPARTMENTAL KNEE;  Surgeon: Marianna Payment, MD;  Location: [facility];  Service: Orthopedics;  Laterality: Left;    There were no vitals filed for this visit.  Visit Diagnosis:  Left leg weakness  Difficulty walking  Knee stiffness, left  Left knee pain  Edema  Abnormality of gait      Subjective Assessment - 11/25/14 1706    Subjective Reports she feels that she is starting to get some strength back in it.  Now able to go up and down the stairs at work (needs rail assist while descending). Still feels like her ROM is limited (especially with the bending)   Currently in Pain? Yes   Pain Score --  2-3/10 now, 0/10 at best, 3-4/10 at worst (the past week)   Pain Location Knee   Pain Orientation Left;Posterior;Medial   Pain Descriptors / Indicators Sore            [facility] PT Assessment - 11/25/14 1709    AROM   AROM Assessment Site Knee   Right/Left Knee Left   Left Knee Extension 7   Left Knee Flexion 113   Strength   Right/Left Knee Left   Left Knee Flexion 4/5  posterior and medial knee pain   Left Knee Extension --  5-/5      TODAY'S TREATMENT TherEx - Rec Bike lvl 2, 5' MMT/ROM/Goal check SL Bridge 10x Rt, 7x Lt SL Leg Press 20# 2x15 Bil Knee Flexion 35# x15 Bil Knee Extension 25# x15 TRX DL Squats x15 Up and Down  1 flight of stairs with good mechanics/reciprocal pattern.         PT Short Term Goals - 11/25/14 1750    PT SHORT TERM GOAL #  1   Title pt independent with initial HEP by 08/22/14   Status Achieved           PT Long Term Goals - 11/25/14 1750    PT LONG TERM GOAL #1   Title pt independent with advanced HEP as necessary by 10/10/14   Status On-going   PT LONG TERM GOAL #2   Title pt able to ambulate with good mechanics without need for AD over level and uneven terrain by 10/10/14  Lack of TKE is evident with gait causing abnormal mechanics on Lt   Status On-going   PT LONG TERM GOAL #3   Title pt able to ascend/descend stairs with reciprocal gait and no greater than single rail assistance by 10/10/14   Status Achieved   PT LONG TERM GOAL #4   Title L Knee AROM 0-130 by 10/10/14   Status On-going   PT LONG TERM GOAL #5   Title L LE MMT 4+/5 or better by 10/10/14   Status On-going               Plan - 11/25/14 1747    Clinical Impression Statement Excellent tolerance to treatment, pt does demonstrate lack of strength with Lt LE compared to  the Rt (fatigued during [employer]). Good mechanics with squats and stairs. Able to ascend/descend stairs with reciprocal pattern and only needing light rail assist on descending. Slowly meeting goals but is still progressing. Still lacks TKE which is evident with gait.    PT Next Visit Plan Continue per POC, focus on TKE and strength.         Problem List Patient Active Problem List   Diagnosis Date Noted  . Localized osteoarthritis of left knee 08/05/2014  . Status post left partial knee replacement 08/05/2014  . Other specified iron deficiency anemias 02/05/2013  . Gastric bypass status for obesity 02/05/2013    Barbette Hair, PTA 11/25/2014, 5:51 PM  [facility] [address]  [place], [place], [address] Phone: [phone]   Fax:  [phone]

## 2014-11-27 ENCOUNTER — Ambulatory Visit: Payer: 59 | Admitting: Rehabilitation

## 2014-11-27 DIAGNOSIS — M25562 Pain in left knee: Secondary | ICD-10-CM

## 2014-11-27 DIAGNOSIS — R29898 Other symptoms and signs involving the musculoskeletal system: Secondary | ICD-10-CM

## 2014-11-27 DIAGNOSIS — R609 Edema, unspecified: Secondary | ICD-10-CM

## 2014-11-27 DIAGNOSIS — R269 Unspecified abnormalities of gait and mobility: Secondary | ICD-10-CM

## 2014-11-27 DIAGNOSIS — R262 Difficulty in walking, not elsewhere classified: Secondary | ICD-10-CM

## 2014-11-27 DIAGNOSIS — M25662 Stiffness of left knee, not elsewhere classified: Secondary | ICD-10-CM

## 2014-11-27 NOTE — Therapy (Signed)
[facility] [address]  [place], [place], [address] Phone: [phone]   Fax:  [phone]  Physical Therapy Treatment  Patient Details  Name: Brandi Lutz MRN: [id number] Date of Birth: [birth year] Referring Provider:  Leandrew Koyanagi, MD  Encounter Date: 11/27/2014      PT End of Session - 11/27/14 1531    Visit Number 19   Number of Visits 22   Date for PT Re-Evaluation 11/29/14   PT Start Time 8502   PT Stop Time 1620   PT Time Calculation (min) 49 min   Activity Tolerance Patient tolerated treatment well   Behavior During Therapy [facility] for tasks assessed/performed      Past Medical History  Diagnosis Date  . Gastric bypass status for obesity 02/05/2013  . Depression     takes Wellbutrin and Zoloft daily  . Cancer     colon  . PONV (postoperative nausea and vomiting)   . History of migraine     none since high school  . Arthritis   . Joint pain   . Joint swelling   . History of colon polyps     benign  . Other specified iron deficiency anemias 02/05/2013    2 iron infusions since gastric bypass;no abnormal reaction noted  . Anxiety   . S/P left unicompartmental knee replacement 08/05/2014    Past Surgical History  Procedure Laterality Date  . Arthroscopic repair acl Left 20 yrs ago  . Colectomy    . Gastric bypass    . Cholecystectomy    . Herniated bowel repair    . Knee arthroscopy Left   . Colonoscopy    . Partial knee arthroplasty Left 08/05/2014    Procedure: LEFT UNICOMPARTMENTAL KNEE;  Surgeon: Marianna Payment, MD;  Location: [facility];  Service: Orthopedics;  Laterality: Left;    There were no vitals filed for this visit.  Visit Diagnosis:  Left leg weakness  Knee stiffness, left  Left knee pain  Difficulty walking  Edema  Abnormality of gait      Subjective Assessment - 11/27/14 1534    Subjective States the knee feels good and she has been focusing on her gait.     Currently in Pain? No/denies      TODAY'S TREATMENT TherEx - Rec Bike lvl 2, 5' Bil SAQ 5# 5"x10x2 Bridges 10x, Bridges 5x (very difficult) Prone Quad stretch 3x20" TRX DL Squats 15x 6" Eccentric Reach Downs 12x with 2 pole assist SL Leg Press 20# 2x15 Standing Hip Extension, Flexion, Abduction with Double Green TB 10x each, Bil with 2 pole assist.         PT Short Term Goals - 11/25/14 1750    PT SHORT TERM GOAL #1   Title pt independent with initial HEP by 08/22/14   Status Achieved           PT Long Term Goals - 11/25/14 1750    PT LONG TERM GOAL #1   Title pt independent with advanced HEP as necessary by 10/10/14   Status On-going   PT LONG TERM GOAL #2   Title pt able to ambulate with good mechanics without need for AD over level and uneven terrain by 10/10/14  Lack of TKE is evident with gait causing abnormal mechanics on Lt   Status On-going   PT LONG TERM GOAL #3   Title pt able to ascend/descend stairs with reciprocal gait and no greater than single  rail assistance by 10/10/14   Status Achieved   PT LONG TERM GOAL #4   Title L Knee AROM 0-130 by 10/10/14   Status On-going   PT LONG TERM GOAL #5   Title L LE MMT 4+/5 or better by 10/10/14   Status On-going               Plan - 11/27/14 1624    Clinical Impression Statement Difficultly with hip extension exericses but good tolerance.     PT Next Visit Plan Continue per POC, focus on TKE and strength.    Consulted and Agree with Plan of Care Patient        Problem List Patient Active Problem List   Diagnosis Date Noted  . Localized osteoarthritis of left knee 08/05/2014  . Status post left partial knee replacement 08/05/2014  . Other specified iron deficiency anemias 02/05/2013  . Gastric bypass status for obesity 02/05/2013    Barbette Hair, PTA 11/27/2014, 4:28 PM  [facility] [address]  [place], [place],  [address] Phone: [phone]   Fax:  [phone]

## 2014-12-04 ENCOUNTER — Ambulatory Visit: Payer: 59 | Admitting: Rehabilitation

## 2014-12-10 ENCOUNTER — Ambulatory Visit: Payer: 59 | Admitting: Rehabilitation

## 2014-12-10 DIAGNOSIS — R269 Unspecified abnormalities of gait and mobility: Secondary | ICD-10-CM

## 2014-12-10 DIAGNOSIS — R29898 Other symptoms and signs involving the musculoskeletal system: Secondary | ICD-10-CM | POA: Diagnosis not present

## 2014-12-10 DIAGNOSIS — M25662 Stiffness of left knee, not elsewhere classified: Secondary | ICD-10-CM

## 2014-12-10 DIAGNOSIS — R262 Difficulty in walking, not elsewhere classified: Secondary | ICD-10-CM

## 2014-12-10 DIAGNOSIS — M25562 Pain in left knee: Secondary | ICD-10-CM

## 2014-12-10 NOTE — Therapy (Signed)
[facility] [address]  [place], [place], [address] Phone: [phone]   Fax:  [phone]  Physical Therapy Treatment  Patient Details  Name: Brandi Lutz MRN: [id number] Date of Birth: 1975-08-18 Referring Provider:  Leandrew Koyanagi, MD  Encounter Date: 12/10/2014      PT End of Session - 12/10/14 1706    Visit Number 20   Number of Visits 22   Date for PT Re-Evaluation 11/29/14   PT Start Time 1701   PT Stop Time 1741   PT Time Calculation (min) 40 min   Activity Tolerance Patient tolerated treatment well   Behavior During Therapy [facility] for tasks assessed/performed      Past Medical History  Diagnosis Date  . Gastric bypass status for obesity 02/05/2013  . Depression     takes Wellbutrin and Zoloft daily  . Cancer     colon  . PONV (postoperative nausea and vomiting)   . History of migraine     none since high school  . Arthritis   . Joint pain   . Joint swelling   . History of colon polyps     benign  . Other specified iron deficiency anemias 02/05/2013    2 iron infusions since gastric bypass;no abnormal reaction noted  . Anxiety   . S/P left unicompartmental knee replacement 08/05/2014    Past Surgical History  Procedure Laterality Date  . Arthroscopic repair acl Left 20 yrs ago  . Colectomy    . Gastric bypass    . Cholecystectomy    . Herniated bowel repair    . Knee arthroscopy Left   . Colonoscopy    . Partial knee arthroplasty Left 08/05/2014    Procedure: LEFT UNICOMPARTMENTAL KNEE;  Surgeon: Marianna Payment, MD;  Location: [facility];  Service: Orthopedics;  Laterality: Left;    There were no vitals filed for this visit.  Visit Diagnosis:  Left leg weakness  Knee stiffness, left  Difficulty walking  Abnormality of gait  Left knee pain      Subjective Assessment - 12/10/14 1705    Subjective Reports no pain, just stiffness in the morning. Main complaint is ROM.    Currently in Pain? No/denies      TODAY'S TREATMENT TherEx - Rec Bike lvl 2, 5' TRX DL Squats 15x SL Leg Press 25# 2x10 6" Eccentric Reach Downs 10x with 1 pole assist Bil Knee Extension 25# x15; then both up, Lt down 15# 10x  Bil Knee Flexion 35# 15x Bridges with ball squeeze 5"x20 Bil SAQ 5# 5"x10x2 Prone Quad stretch 3x20"  FOTO: 42% limited (improved from 59%) AROM: 6-117       PT Short Term Goals - 11/25/14 1750    PT SHORT TERM GOAL #1   Title pt independent with initial HEP by 08/22/14   Status Achieved           PT Long Term Goals - 11/25/14 1750    PT LONG TERM GOAL #1   Title pt independent with advanced HEP as necessary by 10/10/14   Status On-going   PT LONG TERM GOAL #2   Title pt able to ambulate with good mechanics without need for AD over level and uneven terrain by 10/10/14  Lack of TKE is evident with gait causing abnormal mechanics on Lt   Status On-going   PT LONG TERM GOAL #3   Title pt able to ascend/descend stairs with reciprocal gait and  no greater than single rail assistance by 10/10/14   Status Achieved   PT LONG TERM GOAL #4   Title L Knee AROM 0-130 by 10/10/14   Status On-going   PT LONG TERM GOAL #5   Title L LE MMT 4+/5 or better by 10/10/14   Status On-going               Plan - 12/10/14 1742    Clinical Impression Statement Improved AROM noted today and better tolerance to all exercises. Patients's POC is up at next visit, will reasses and consider renewal vs dc. Patient is concerned about her lack of motion but told her that is will continue to improve as time goes on and as she continues to stretch at home.    PT Next Visit Plan Continue per POC, focus on TKE and strength.         Problem List Patient Active Problem List   Diagnosis Date Noted  . Localized osteoarthritis of left knee 08/05/2014  . Status post left partial knee replacement 08/05/2014  . Other specified iron deficiency anemias 02/05/2013  . Gastric  bypass status for obesity 02/05/2013    Barbette Hair, [place] 12/10/2014, 5:48 PM  [facility] [address]  [place], [place], [address] Phone: [phone]   Fax:  [phone]

## 2014-12-11 ENCOUNTER — Ambulatory Visit: Payer: 59 | Admitting: Rehabilitation

## 2014-12-11 DIAGNOSIS — R269 Unspecified abnormalities of gait and mobility: Secondary | ICD-10-CM

## 2014-12-11 DIAGNOSIS — R29898 Other symptoms and signs involving the musculoskeletal system: Secondary | ICD-10-CM | POA: Diagnosis not present

## 2014-12-11 DIAGNOSIS — M25662 Stiffness of left knee, not elsewhere classified: Secondary | ICD-10-CM

## 2014-12-11 DIAGNOSIS — R609 Edema, unspecified: Secondary | ICD-10-CM

## 2014-12-11 DIAGNOSIS — R262 Difficulty in walking, not elsewhere classified: Secondary | ICD-10-CM

## 2014-12-11 DIAGNOSIS — M25562 Pain in left knee: Secondary | ICD-10-CM

## 2014-12-11 NOTE — Therapy (Addendum)
[facility] [address]  [place], [place], [address] Phone: [phone]   Fax:  [phone]  Physical Therapy Treatment  Patient Details  Name: Brandi Lutz MRN: [id number] Date of Birth: 26-Apr-1976 Referring Provider:  Leandrew Koyanagi, MD  Encounter Date: 12/11/2014      PT End of Session - 12/11/14 1708    Visit Number 21   Number of Visits 22   Date for PT Re-Evaluation 11/29/14   PT Start Time 9833   PT Stop Time 1746   PT Time Calculation (min) 42 min      Past Medical History  Diagnosis Date  . Gastric bypass status for obesity 02/05/2013  . Depression     takes Wellbutrin and Zoloft daily  . Cancer     colon  . PONV (postoperative nausea and vomiting)   . History of migraine     none since high school  . Arthritis   . Joint pain   . Joint swelling   . History of colon polyps     benign  . Other specified iron deficiency anemias 02/05/2013    2 iron infusions since gastric bypass;no abnormal reaction noted  . Anxiety   . S/P left unicompartmental knee replacement 08/05/2014    Past Surgical History  Procedure Laterality Date  . Arthroscopic repair acl Left 20 yrs ago  . Colectomy    . Gastric bypass    . Cholecystectomy    . Herniated bowel repair    . Knee arthroscopy Left   . Colonoscopy    . Partial knee arthroplasty Left 08/05/2014    Procedure: LEFT UNICOMPARTMENTAL KNEE;  Surgeon: Marianna Payment, MD;  Location: [facility];  Service: Orthopedics;  Laterality: Left;    There were no vitals filed for this visit.  Visit Diagnosis:  Left leg weakness  Knee stiffness, left  Difficulty walking  Abnormality of gait  Left knee pain  Edema      Subjective Assessment - 12/11/14 1709    Subjective Feeling good after yesterday, no pain.    Currently in Pain? No/denies      TODAY'S TREATMENT TherEx - Rec Bike lvl 2, 5' Standing TKE with ball against wall 15x5" Fwd Lunges with  Lt leg on table 10x3" Side-Stepping with cook band 57f x2 each way Prone quad stretch 4x20" Bridges with ball squeeze 5"x20 Bil SAQ 5# 5"x15x2 6" Eccentric Reach Downs 15x with 1 pole assist Standing Hip Abduction with double Green TB 15x with 1 pole assist, bilateral Standing Hip Extension with double Green TB 15x with 1 pole assist, bilateral       PT Short Term Goals - 11/25/14 1750    PT SHORT TERM GOAL #1   Title pt independent with initial HEP by 08/22/14   Status Achieved           PT Long Term Goals - 11/25/14 1750    PT LONG TERM GOAL #1   Title pt independent with advanced HEP as necessary by 10/10/14   Status On-going   PT LONG TERM GOAL #2   Title pt able to ambulate with good mechanics without need for AD over level and uneven terrain by 10/10/14  Lack of TKE is evident with gait causing abnormal mechanics on Lt   Status On-going   PT LONG TERM GOAL #3   Title pt able to ascend/descend stairs with reciprocal gait and no greater than single rail assistance by  10/10/14   Status Achieved   PT LONG TERM GOAL #4   Title L Knee AROM 0-130 by 10/10/14   Status On-going   PT LONG TERM GOAL #5   Title L LE MMT 4+/5 or better by 10/10/14   Status On-going               Plan - 12/11/14 1751    Clinical Impression Statement Good tolerance to all exercises with quad fatiguing quickly. Pt making good progress in general with tolerance to all exercises and ROM. Pt's POC is up at next visit and will reassess and consider renewal  vs dc.    PT Next Visit Plan Renewal vs dc        Problem List Patient Active Problem List   Diagnosis Date Noted  . Localized osteoarthritis of left knee 08/05/2014  . Status post left partial knee replacement 08/05/2014  . Other specified iron deficiency anemias 02/05/2013  . Gastric bypass status for obesity 02/05/2013    Otelia Limes [place], PTA 12/11/2014, 5:56 PM  [facility] [address]  [place], [place], [address] Phone: [phone]   Fax:  [phone]     PHYSICAL THERAPY DISCHARGE SUMMARY  Visits from Start of Care: 21  Current functional level related to goals / functional outcomes: Unknown, goals not re-assessed since 11/25/14   Remaining deficits: Mild TKE lag   Education / Equipment: HEP Plan: Patient agrees to discharge.  Patient goals were partially met. Patient is being discharged due to being pleased with the current functional level.  ?????       Leonette Most PT, OCS 12/25/2014 10:34 AM

## 2014-12-12 ENCOUNTER — Telehealth: Payer: Self-pay | Admitting: *Deleted

## 2014-12-12 NOTE — Telephone Encounter (Signed)
Patient c/o fatigue and feeling like she needs iron infusion. Patient was seen in our office late 2014 and received two iron infusions. She works as [employer] and would like lab orders sent there. Spoke with Dr Marin Olp who is okay with sending orders. Patient is aware that orders will be faxed and she will arrange blood draw.

## 2014-12-18 ENCOUNTER — Encounter: Payer: Self-pay | Admitting: *Deleted

## 2014-12-18 ENCOUNTER — Telehealth: Payer: Self-pay | Admitting: Family

## 2014-12-18 NOTE — Telephone Encounter (Signed)
Contact pt to schedule iron appt. Per orders

## 2014-12-19 ENCOUNTER — Ambulatory Visit (HOSPITAL_BASED_OUTPATIENT_CLINIC_OR_DEPARTMENT_OTHER): Payer: 59

## 2014-12-19 ENCOUNTER — Ambulatory Visit (HOSPITAL_BASED_OUTPATIENT_CLINIC_OR_DEPARTMENT_OTHER): Payer: 59 | Admitting: Family

## 2014-12-19 ENCOUNTER — Encounter: Payer: Self-pay | Admitting: Family

## 2014-12-19 VITALS — BP 121/73 | HR 78 | Temp 97.8°F | Resp 16 | Ht 68.0 in | Wt 188.0 lb

## 2014-12-19 DIAGNOSIS — D509 Iron deficiency anemia, unspecified: Secondary | ICD-10-CM

## 2014-12-19 DIAGNOSIS — D508 Other iron deficiency anemias: Secondary | ICD-10-CM

## 2014-12-19 MED ORDER — SODIUM CHLORIDE 0.9 % IV SOLN
510.0000 mg | Freq: Once | INTRAVENOUS | Status: AC
  Administered 2014-12-19: 510 mg via INTRAVENOUS
  Filled 2014-12-19: qty 17

## 2014-12-19 NOTE — Progress Notes (Signed)
Hematology and Oncology Follow Up Visit  Brandi Lutz [id number] 06-22-1975 39 y.o. 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 [name] doing really well but recently noticed that she was having chills and craving ice and mints. She had her iron studies checked by her PCP and her iron saturation was 4% with a ferritin of 7.  She did have a partial left knee replacement in February and is finishing up PT. She has done well and is staying active. There is no swelling, tenderness, numbness or tingling in her extremities.  No bruising or episodes of bleeding.  Her cycles have been "normal." No heavy bleeding or clots.  Amazingly she has not experienced any fatigue so far. No cough, rash, dizziness, headaches, SOB, chest pain, palpitations, abdominal pain, changes in bowel or bladder habits. No blood in her urine or stool.  She had her mammogram in September 2015. This showed a possible mass in the left breast but further evaluation showed no evidence of malignancy.  She does have a history of stage II colon cancer which was resected in 2007.  [name] eating healthy and staying hydrated. Her weight is stable.   Medications:    Medication List       This list is accurate as of: 12/19/14  2:13 PM.  Always use your most recent med list.               acetaminophen 500 MG tablet  Commonly known as:  TYLENOL  Take 1,000 mg by mouth every 8 (eight) hours as needed for mild pain or moderate pain.     buPROPion 150 MG 24 hr tablet  Commonly known as:  WELLBUTRIN XL  Take 150 mg by mouth daily.     multivitamin with minerals tablet  Take 1 tablet by mouth daily. Women's ultra Multi-vitamin     sertraline 50 MG tablet  Commonly known as:  ZOLOFT  Take 50 mg by mouth daily.        Allergies: No Known Allergies  Past Medical History,  Surgical history, Social history, and Family History were reviewed and updated.  Review of Systems: All other 10 point review of systems is negative.   Physical Exam:  height is 5\' 8"  (1.727 m) and weight is 188 lb (85.276 kg). Her oral temperature is 97.8 F (36.6 C). Her blood pressure is 121/73 and her pulse is 78. Her respiration is 16.   Wt Readings from Last 3 Encounters:  12/19/14 188 lb (85.276 kg)  08/05/14 196 lb 7 oz (89.103 kg)  07/22/14 196 lb 7 oz (89.103 kg)    Ocular: Sclerae unicteric, pupils equal, round and reactive to light Ear-nose-throat: Oropharynx clear, dentition fair Lymphatic: No cervical or supraclavicular adenopathy Lungs no rales or rhonchi, good excursion bilaterally Heart regular rate and rhythm, no murmur appreciated Abd soft, nontender, positive bowel sounds MSK no focal spinal tenderness, no joint edema Neuro: non-focal, well-oriented, appropriate affect Breasts: Deferred  Lab Results  Component Value Date   WBC 7.5 08/06/2014   HGB 8.7* 08/06/2014   HCT 26.9* 08/06/2014   MCV 72.1* 08/06/2014   PLT 256 08/06/2014   No results found for: FERRITIN, IRON, TIBC, UIBC, IRONPCTSAT Lab Results  Component Value Date   RBC 3.73* 08/06/2014   No results found for: KPAFRELGTCHN, LAMBDASER, KAPLAMBRATIO No results  found for: IGGSERUM, IGA, IGMSERUM No results found for: Odetta Pink, SPEI   Chemistry      Component Value Date/Time   NA 140 08/06/2014 0637   K 3.5 08/06/2014 0637   CL 108 08/06/2014 0637   CO2 30 08/06/2014 0637   BUN 7 08/06/2014 0637   CREATININE 0.79 08/06/2014 0637      Component Value Date/Time   CALCIUM 8.1* 08/06/2014 0637   ALKPHOS 77 08/05/2014 1031   AST 28 08/05/2014 1031   ALT 25 08/05/2014 1031   BILITOT 0.6 08/05/2014 1031     Impression and Plan: Brandi Lutz is a very pleasant 39 yo African American female with a history of iron deficiency anemia  secondary to gastric bypass. Her last dose of Feraheme was in February 2015. [name] now symptomatic with chills and craving ice and mints.  Her iron saturation is 4% with a ferritin of 7. Her Hgb is also down at 8.8. We will give her a dose of iron today and then again in 8 days.  She would like for her iron studies to be drawn in our office from now on. I put in a standing order so that if she becomes symptomatic between visits she can come in and have her lab work checked. We will plan to see her back 6 weeks after her second dose for labs and follow-up.  She knows to call here with any questions or concerns. We can certainly see her sooner if need be.   Eliezer Bottom, NP 7/7/20162:13 PM

## 2014-12-19 NOTE — Patient Instructions (Signed)

## 2014-12-26 ENCOUNTER — Ambulatory Visit (HOSPITAL_BASED_OUTPATIENT_CLINIC_OR_DEPARTMENT_OTHER): Payer: 59

## 2014-12-26 ENCOUNTER — Other Ambulatory Visit: Payer: Self-pay | Admitting: Family

## 2014-12-26 ENCOUNTER — Ambulatory Visit: Payer: 59 | Admitting: Family

## 2014-12-26 VITALS — BP 118/67 | HR 80 | Temp 97.9°F | Resp 18

## 2014-12-26 DIAGNOSIS — D508 Other iron deficiency anemias: Secondary | ICD-10-CM

## 2014-12-26 MED ORDER — SODIUM CHLORIDE 0.9 % IV SOLN
510.0000 mg | Freq: Once | INTRAVENOUS | Status: AC
  Administered 2014-12-26: 510 mg via INTRAVENOUS
  Filled 2014-12-26: qty 17

## 2014-12-26 MED ORDER — SODIUM CHLORIDE 0.9 % IV SOLN
510.0000 mg | Freq: Once | INTRAVENOUS | Status: DC
  Filled 2014-12-26: qty 17

## 2014-12-26 NOTE — Patient Instructions (Signed)

## 2015-01-31 ENCOUNTER — Ambulatory Visit (HOSPITAL_BASED_OUTPATIENT_CLINIC_OR_DEPARTMENT_OTHER): Payer: 59 | Admitting: Hematology & Oncology

## 2015-01-31 ENCOUNTER — Encounter: Payer: Self-pay | Admitting: Hematology & Oncology

## 2015-01-31 ENCOUNTER — Other Ambulatory Visit (HOSPITAL_BASED_OUTPATIENT_CLINIC_OR_DEPARTMENT_OTHER): Payer: 59

## 2015-01-31 VITALS — BP 126/78 | HR 74 | Temp 97.5°F | Resp 18 | Ht 68.0 in | Wt 196.0 lb

## 2015-01-31 DIAGNOSIS — K909 Intestinal malabsorption, unspecified: Secondary | ICD-10-CM | POA: Diagnosis not present

## 2015-01-31 DIAGNOSIS — Z9884 Bariatric surgery status: Secondary | ICD-10-CM

## 2015-01-31 DIAGNOSIS — D508 Other iron deficiency anemias: Secondary | ICD-10-CM | POA: Diagnosis not present

## 2015-01-31 DIAGNOSIS — D510 Vitamin B12 deficiency anemia due to intrinsic factor deficiency: Secondary | ICD-10-CM

## 2015-01-31 LAB — CBC WITH DIFFERENTIAL (CANCER CENTER ONLY)
BASO#: 0 10*3/uL (ref 0.0–0.2)
BASO%: 0.6 % (ref 0.0–2.0)
EOS%: 3.8 % (ref 0.0–7.0)
Eosinophils Absolute: 0.1 10*3/uL (ref 0.0–0.5)
HCT: 34.7 % — ABNORMAL LOW (ref 34.8–46.6)
HGB: 11.4 g/dL — ABNORMAL LOW (ref 11.6–15.9)
LYMPH#: 1.3 10*3/uL (ref 0.9–3.3)
LYMPH%: 36.9 % (ref 14.0–48.0)
MCH: 25.1 pg — ABNORMAL LOW (ref 26.0–34.0)
MCHC: 32.9 g/dL (ref 32.0–36.0)
MCV: 76 fL — ABNORMAL LOW (ref 81–101)
MONO#: 0.3 10*3/uL (ref 0.1–0.9)
MONO%: 8 % (ref 0.0–13.0)
NEUT#: 1.7 10*3/uL (ref 1.5–6.5)
NEUT%: 50.7 % (ref 39.6–80.0)
Platelets: 220 10*3/uL (ref 145–400)
RBC: 4.55 10*6/uL (ref 3.70–5.32)
RDW: 24.4 % — ABNORMAL HIGH (ref 11.1–15.7)
WBC: 3.4 10*3/uL — ABNORMAL LOW (ref 3.9–10.0)

## 2015-01-31 LAB — IRON AND TIBC CHCC
%SAT: 24 % (ref 21–57)
Iron: 72 ug/dL (ref 41–142)
TIBC: 295 ug/dL (ref 236–444)
UIBC: 223 ug/dL (ref 120–384)

## 2015-01-31 LAB — FERRITIN CHCC: Ferritin: 35 ng/ml (ref 9–269)

## 2015-01-31 NOTE — Progress Notes (Signed)
Hematology and Oncology Follow Up Visit  Brandi Lutz [id number] March 20, 1976 39 y.o. 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     Interim History:  Brandi Lutz is back for follow-up. We last saw her back in early July. We gave her IV iron back in July. She feels better. She does not feel as tired. [name] not chewing ice. She does have some slight fatigue.  There is no problems with bleeding. She's had no rashes. She's had no nausea or vomiting.  She's had no change in bowel or bladder habits.  She does have her monthly cycles but these have been pretty much normal.  Medications:  Current outpatient prescriptions:  .  acetaminophen (TYLENOL) 500 MG tablet, Take 1,000 mg by mouth every 8 (eight) hours as needed for mild pain or moderate pain., Disp: , Rfl:  .  buPROPion (WELLBUTRIN XL) 150 MG 24 hr tablet, Take 150 mg by mouth daily., Disp: , Rfl:  .  Multiple Vitamins-Minerals (MULTIVITAMIN WITH MINERALS) tablet, Take 1 tablet by mouth daily. Women's ultra Multi-vitamin, Disp: , Rfl:  .  sertraline (ZOLOFT) 50 MG tablet, Take 50 mg by mouth daily., Disp: , Rfl:   Allergies: No Known Allergies  Past Medical History, Surgical history, Social history, and Family History were reviewed and updated.  Review of Systems: As above  Physical Exam:  height is 5\' 8"  (1.727 m) and weight is 196 lb (88.905 kg). Her oral temperature is 97.5 F (36.4 C). Her blood pressure is 126/78 and her pulse is 74. Her respiration is 18.   Wt Readings from Last 3 Encounters:  01/31/15 196 lb (88.905 kg)  12/19/14 188 lb (85.276 kg)  08/05/14 196 lb 7 oz (89.103 kg)     Well-developed and well-nourished African-American female in no obvious distress. Head and neck exam shows no ocular or oral lesions. She has no palpable  cervical or supraclavicular lymph nodes. Lungs are clear to percussion and auscultation bilaterally. Cardiac exam regular rate and rhythm with a normal S1 and S2. There are no murmurs, rubs or bruits. Abdomen is soft. She has good bowel sounds. There is no fluid wave. There is no palpable liver or spleen tip. Back exam shows no tenderness over the spine, ribs or hips. Extremities shows no clubbing, cyanosis or edema. Skin exam shows no rashes, ecchymoses or petechia. Neurological exam shows no focal neurological deficits.  Lab Results  Component Value Date   WBC 3.4* 01/31/2015   HGB 11.4* 01/31/2015   HCT 34.7* 01/31/2015   MCV 76* 01/31/2015   PLT 220 01/31/2015     Chemistry      Component Value Date/Time   NA 140 08/06/2014 0637   K 3.5 08/06/2014 0637   CL 108 08/06/2014 0637   CO2 30 08/06/2014 0637   BUN 7 08/06/2014 0637   CREATININE 0.79 08/06/2014 0637      Component Value Date/Time   CALCIUM 8.1* 08/06/2014 0637   ALKPHOS 77 08/05/2014 1031   AST 28 08/05/2014 1031   ALT 25 08/05/2014 1031   BILITOT 0.6 08/05/2014 1031         Impression and Plan: Brandi Lutz is 40 year old Afro-American female. [name] 7 years out now from her gastric bypass. She's doing well. She recently had have partial knee replacement. She did try to exercise.  [name] feeling better. Her hemoglobin is a lot better. Her MCV is up.  We will see what the iron studies show. It is possible that we may have to give her some IV iron.  I'm also going to check her vitamin B-12 levels will see her back. [name] getting close to the time in which she might be vitamin B-12 deficient.  I want to see her back in 2-3 months.   Volanda Napoleon, MD 8/19/201610:06 AM

## 2015-05-02 ENCOUNTER — Ambulatory Visit (HOSPITAL_BASED_OUTPATIENT_CLINIC_OR_DEPARTMENT_OTHER): Payer: 59 | Admitting: Family

## 2015-05-02 ENCOUNTER — Encounter: Payer: Self-pay | Admitting: Family

## 2015-05-02 ENCOUNTER — Other Ambulatory Visit (HOSPITAL_BASED_OUTPATIENT_CLINIC_OR_DEPARTMENT_OTHER): Payer: 59

## 2015-05-02 ENCOUNTER — Other Ambulatory Visit: Payer: Self-pay | Admitting: Family

## 2015-05-02 VITALS — BP 122/76 | HR 72 | Temp 97.8°F | Resp 14 | Ht 68.0 in | Wt 203.0 lb

## 2015-05-02 DIAGNOSIS — D510 Vitamin B12 deficiency anemia due to intrinsic factor deficiency: Secondary | ICD-10-CM

## 2015-05-02 DIAGNOSIS — D508 Other iron deficiency anemias: Secondary | ICD-10-CM

## 2015-05-02 DIAGNOSIS — Z9884 Bariatric surgery status: Secondary | ICD-10-CM

## 2015-05-02 LAB — CBC WITH DIFFERENTIAL (CANCER CENTER ONLY)
BASO#: 0 10*3/uL (ref 0.0–0.2)
BASO%: 0.5 % (ref 0.0–2.0)
EOS%: 2.2 % (ref 0.0–7.0)
Eosinophils Absolute: 0.1 10*3/uL (ref 0.0–0.5)
HCT: 34.6 % — ABNORMAL LOW (ref 34.8–46.6)
HGB: 11.5 g/dL — ABNORMAL LOW (ref 11.6–15.9)
LYMPH#: 1.3 10*3/uL (ref 0.9–3.3)
LYMPH%: 31.9 % (ref 14.0–48.0)
MCH: 26.4 pg (ref 26.0–34.0)
MCHC: 33.2 g/dL (ref 32.0–36.0)
MCV: 79 fL — ABNORMAL LOW (ref 81–101)
MONO#: 0.5 10*3/uL (ref 0.1–0.9)
MONO%: 11.5 % (ref 0.0–13.0)
NEUT#: 2.2 10*3/uL (ref 1.5–6.5)
NEUT%: 53.9 % (ref 39.6–80.0)
Platelets: 265 10*3/uL (ref 145–400)
RBC: 4.36 10*6/uL (ref 3.70–5.32)
RDW: 12.8 % (ref 11.1–15.7)
WBC: 4.1 10*3/uL (ref 3.9–10.0)

## 2015-05-02 LAB — RETICULOCYTES (CHCC)
ABS Retic: 31.3 10*3/uL (ref 19.0–186.0)
RBC.: 4.47 MIL/uL (ref 3.87–5.11)
Retic Ct Pct: 0.7 % (ref 0.4–2.3)

## 2015-05-02 LAB — FERRITIN CHCC: Ferritin: 12 ng/ml (ref 9–269)

## 2015-05-02 LAB — VITAMIN B12: Vitamin B-12: 196 pg/mL — ABNORMAL LOW (ref 211–911)

## 2015-05-02 LAB — IRON AND TIBC CHCC
%SAT: 29 % (ref 21–57)
Iron: 106 ug/dL (ref 41–142)
TIBC: 366 ug/dL (ref 236–444)
UIBC: 260 ug/dL (ref 120–384)

## 2015-05-02 NOTE — Progress Notes (Signed)
Hematology and Oncology Follow Up Visit  Brandi Lutz [id number] [birth year] 39 y.o. 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 [name] feeling fatigued and has been craving ice. She last had feraheme in July.  Her iron saturation in August was 24% with a ferritin of 35. Her Hgb today is 11.5 with an MCV of 79.  Her cycles have been regular but heavy at times.  [name] eating well and staying hydrated. Her weight is stable. She states that there are times that she feels like she needs to eat something to keep her energy up.  She has had a hard time at work because the heat is broken and her office area stays around 82 degrees. She feels that the constant heat contributes to her fatigue and causes dizziness. She has requested to be moved to a different area but has not heard anything back yet.  She denies fever, chills, n/v, cough, rash, SOB, chest pain, palpitations, abdominal pain or changes in bowel or bladder habits.  No swelling, tenderness, numbness or tingling in her extremities.   Medications:    Medication List       This list is accurate as of: 05/02/15  9:46 AM.  Always use your most recent med list.               acetaminophen 500 MG tablet  Commonly known as:  TYLENOL  Take 1,000 mg by mouth every 8 (eight) hours as needed for mild pain or moderate pain.     buPROPion 150 MG 24 hr tablet  Commonly known as:  WELLBUTRIN XL  Take 150 mg by mouth daily.     multivitamin with minerals tablet  Take 1 tablet by mouth daily. Women's ultra Multi-vitamin     sertraline 50 MG tablet  Commonly known as:  ZOLOFT  Take 50 mg by mouth daily.        Allergies: No Known Allergies  Past Medical History, Surgical history, Social  history, and Family History were reviewed and updated.  Review of Systems: All other 10 point review of systems is negative.   Physical Exam:  vitals were not taken for this visit.  Wt Readings from Last 3 Encounters:  01/31/15 196 lb (88.905 kg)  12/19/14 188 lb (85.276 kg)  08/05/14 196 lb 7 oz (89.103 kg)    Ocular: Sclerae unicteric, pupils equal, round and reactive to light Ear-nose-throat: Oropharynx clear, dentition fair Lymphatic: No cervical or supraclavicular adenopathy Lungs no rales or rhonchi, good excursion bilaterally Heart regular rate and rhythm, no murmur appreciated Abd soft, nontender, positive bowel sounds MSK no focal spinal tenderness, no joint edema Neuro: non-focal, well-oriented, appropriate affect Breasts: Deferred  Lab Results  Component Value Date   WBC 3.4* 01/31/2015   HGB 11.4* 01/31/2015   HCT 34.7* 01/31/2015   MCV 76* 01/31/2015   PLT 220 01/31/2015   Lab Results  Component Value Date   FERRITIN 35 01/31/2015   IRON 72 01/31/2015   TIBC 295 01/31/2015   UIBC 223 01/31/2015   IRONPCTSAT 24 01/31/2015   Lab Results  Component Value Date   RBC 4.55 01/31/2015   No results found for: KPAFRELGTCHN, LAMBDASER, KAPLAMBRATIO No results found for: IGGSERUM, IGA, IGMSERUM No results found  for: Odetta Pink, SPEI   Chemistry      Component Value Date/Time   NA 140 08/06/2014 0637   K 3.5 08/06/2014 0637   CL 108 08/06/2014 0637   CO2 30 08/06/2014 0637   BUN 7 08/06/2014 0637   CREATININE 0.79 08/06/2014 0637      Component Value Date/Time   CALCIUM 8.1* 08/06/2014 0637   ALKPHOS 77 08/05/2014 1031   AST 28 08/05/2014 1031   ALT 25 08/05/2014 1031   BILITOT 0.6 08/05/2014 1031     Impression and Plan: Brandi Lutz is a pleasant 39 yo African American female with iron deficiency anemia due to malabsorption secondary to gastric bypass.  She last had Feraheme in July. [name]  symptomatic at this time with fatigue and chewing ice.  Her hgb is holding at 11.5 with an MCV of 79. We will see what her iron studies show and bring her in next week for an infusion if needed.  Her B 12 level is also pending.  We will plan to see her back in 3 months for labs and follow-up.  She will contact us with any questions or concerns. We can certainly see her sooner if need be.   Eliezer Bottom, NP 11/18/20169:46 AM

## 2015-05-05 ENCOUNTER — Other Ambulatory Visit: Payer: Self-pay | Admitting: *Deleted

## 2015-05-05 ENCOUNTER — Ambulatory Visit (HOSPITAL_BASED_OUTPATIENT_CLINIC_OR_DEPARTMENT_OTHER): Payer: 59

## 2015-05-05 ENCOUNTER — Telehealth: Payer: Self-pay | Admitting: *Deleted

## 2015-05-05 VITALS — BP 135/75 | HR 80 | Temp 97.9°F | Resp 20

## 2015-05-05 DIAGNOSIS — D508 Other iron deficiency anemias: Secondary | ICD-10-CM | POA: Diagnosis not present

## 2015-05-05 MED ORDER — SODIUM CHLORIDE 0.9 % IV SOLN
510.0000 mg | Freq: Once | INTRAVENOUS | Status: AC
  Administered 2015-05-05: 510 mg via INTRAVENOUS
  Filled 2015-05-05: qty 17

## 2015-05-05 MED ORDER — SODIUM CHLORIDE 0.9 % IV SOLN
INTRAVENOUS | Status: DC
  Administered 2015-05-05: 15:00:00 via INTRAVENOUS

## 2015-05-05 NOTE — Telephone Encounter (Addendum)
Message left on patient's cell phone voicemail. Message sent to scheduler.   ----- Message from Eliezer Bottom, NP sent at 05/02/2015  5:00 PM EST ----- Regarding: Iron Ferritin low. Needs 1 dose of iron next week please. Thank you!  Judson Roch

## 2015-05-05 NOTE — Patient Instructions (Signed)

## 2015-05-06 ENCOUNTER — Other Ambulatory Visit: Payer: Self-pay

## 2015-05-06 DIAGNOSIS — Z1231 Encounter for screening mammogram for malignant neoplasm of breast: Secondary | ICD-10-CM

## 2015-06-12 ENCOUNTER — Ambulatory Visit
Admission: RE | Admit: 2015-06-12 | Discharge: 2015-06-12 | Disposition: A | Discharge status: Home / Self Care | Payer: 59 | Source: Ambulatory Visit

## 2015-06-12 DIAGNOSIS — Z1231 Encounter for screening mammogram for malignant neoplasm of breast: Secondary | ICD-10-CM

## 2015-08-04 ENCOUNTER — Ambulatory Visit (HOSPITAL_BASED_OUTPATIENT_CLINIC_OR_DEPARTMENT_OTHER): Payer: 59 | Admitting: Family

## 2015-08-04 ENCOUNTER — Other Ambulatory Visit (HOSPITAL_BASED_OUTPATIENT_CLINIC_OR_DEPARTMENT_OTHER): Payer: 59

## 2015-08-04 VITALS — BP 144/81 | HR 75 | Temp 97.5°F | Resp 20 | Wt 210.0 lb

## 2015-08-04 DIAGNOSIS — Z980 Intestinal bypass and anastomosis status: Secondary | ICD-10-CM

## 2015-08-04 DIAGNOSIS — D510 Vitamin B12 deficiency anemia due to intrinsic factor deficiency: Secondary | ICD-10-CM | POA: Diagnosis not present

## 2015-08-04 DIAGNOSIS — K9089 Other intestinal malabsorption: Secondary | ICD-10-CM

## 2015-08-04 DIAGNOSIS — D508 Other iron deficiency anemias: Secondary | ICD-10-CM

## 2015-08-04 LAB — CBC WITH DIFFERENTIAL (CANCER CENTER ONLY)
BASO#: 0 10*3/uL (ref 0.0–0.2)
BASO%: 0.5 % (ref 0.0–2.0)
EOS%: 1.7 % (ref 0.0–7.0)
Eosinophils Absolute: 0.1 10*3/uL (ref 0.0–0.5)
HCT: 40.4 % (ref 34.8–46.6)
HGB: 13.5 g/dL (ref 11.6–15.9)
LYMPH#: 1.4 10*3/uL (ref 0.9–3.3)
LYMPH%: 33.7 % (ref 14.0–48.0)
MCH: 27 pg (ref 26.0–34.0)
MCHC: 33.4 g/dL (ref 32.0–36.0)
MCV: 81 fL (ref 81–101)
MONO#: 0.4 10*3/uL (ref 0.1–0.9)
MONO%: 9.2 % (ref 0.0–13.0)
NEUT#: 2.3 10*3/uL (ref 1.5–6.5)
NEUT%: 54.9 % (ref 39.6–80.0)
Platelets: 295 10*3/uL (ref 145–400)
RBC: 5 10*6/uL (ref 3.70–5.32)
RDW: 13.7 % (ref 11.1–15.7)
WBC: 4.2 10*3/uL (ref 3.9–10.0)

## 2015-08-04 NOTE — Progress Notes (Signed)
Hematology and Oncology Follow Up Visit  Brandi Lutz [id number] 04-25-1976 40 y.o. 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 [name] feeling fatigued at this time. She has been under some stress at home.  Her last dose of Feraheme was in November. Her iron saturation at that time was 29% with a ferritin of 12.  She states that she is resting well. [name] staying active walking her dog in the evening.  Her appetite is good and she is staying well hydrated. Her weight is up 7 lbs since her last visit.  No fever, chills, n/v, cough, rash, SOB, chest pain, palpitations, abdominal pain or changes in bowel or bladder habits.  She states that her cycles have stayed regular but are heavy after she receives an iron infusion.  No numbness or tingling in her extremities. No c/o joint aches or pains at this time.   Medications:    Medication List       This list is accurate as of: 08/04/15  3:07 PM.  Always use your most recent med list.               acetaminophen 500 MG tablet  Commonly known as:  TYLENOL  Take 1,000 mg by mouth every 8 (eight) hours as needed for mild pain or moderate pain.     buPROPion 150 MG 24 hr tablet  Commonly known as:  WELLBUTRIN XL  Take 150 mg by mouth daily.     MULTI-VITAMINS Tabs  Take 1 tablet by mouth.     multivitamin with minerals tablet  Take 1 tablet by mouth daily. Women's ultra Multi-vitamin     RA IRON 27 MG Tabs  Generic drug:  Ferrous Sulfate  Take 29 mg by mouth.     sertraline 50 MG tablet  Commonly known as:  ZOLOFT  Take 50 mg by mouth daily.        Allergies:  Allergies  Allergen Reactions  . Tape Rash    Past Medical History, Surgical history, Social history, and Family History  were reviewed and updated.  Review of Systems: All other 10 point review of systems is negative.   Physical Exam:  vitals were not taken for this visit.  Wt Readings from Last 3 Encounters:  05/02/15 203 lb (92.08 kg)  01/31/15 196 lb (88.905 kg)  12/19/14 188 lb (85.276 kg)    Ocular: Sclerae unicteric, pupils equal, round and reactive to light Ear-nose-throat: Oropharynx clear, dentition fair Lymphatic: No cervical supraclavicular or axillary adenopathy Lungs no rales or rhonchi, good excursion bilaterally Heart regular rate and rhythm, no murmur appreciated Abd soft, nontender, positive bowel sounds, no liber or spleen palpated on exam MSK no focal spinal tenderness, no joint edema Neuro: non-focal, well-oriented, appropriate affect Breasts: Deferred  Lab Results  Component Value Date   WBC 4.1 05/02/2015   HGB 11.5* 05/02/2015   HCT 34.6* 05/02/2015   MCV 79* 05/02/2015   PLT 265 05/02/2015   Lab Results  Component Value Date   FERRITIN 12 05/02/2015   IRON 106 05/02/2015   TIBC 366 05/02/2015   UIBC 260 05/02/2015   IRONPCTSAT 29 05/02/2015   Lab Results  Component Value Date   RETICCTPCT 0.7 05/02/2015   RBC 4.36  05/02/2015   RETICCTABS 31.3 05/02/2015   No results found for: KPAFRELGTCHN, LAMBDASER, KAPLAMBRATIO No results found for: IGGSERUM, IGA, IGMSERUM No results found for: Odetta Pink, SPEI   Chemistry      Component Value Date/Time   NA 140 08/06/2014 0637   K 3.5 08/06/2014 0637   CL 108 08/06/2014 0637   CO2 30 08/06/2014 0637   BUN 7 08/06/2014 0637   CREATININE 0.79 08/06/2014 0637      Component Value Date/Time   CALCIUM 8.1* 08/06/2014 0637   ALKPHOS 77 08/05/2014 1031   AST 28 08/05/2014 1031   ALT 25 08/05/2014 1031   BILITOT 0.6 08/05/2014 1031     Impression and Plan: Brandi Lutz is a pleasant 40 yo African American female with iron deficiency anemia secondary to gastric  bypass and malabsorption. [name] c/o fatigue at this time.  Her CBC today looks good. We will see what her iron studies show and bring her back in later this week for an infusion if needed.  Her B 12 level is pending as well.  We will go ahead and plan to see her back in 3 months for labs and follow-up.  She will contact us with any questions or concerns. We can certainly see her sooner if need be.   Eliezer Bottom, NP 2/20/20173:07 PM

## 2015-08-05 ENCOUNTER — Other Ambulatory Visit: Payer: Self-pay | Admitting: Family

## 2015-08-05 ENCOUNTER — Other Ambulatory Visit: Payer: Self-pay | Admitting: *Deleted

## 2015-08-05 DIAGNOSIS — D508 Other iron deficiency anemias: Secondary | ICD-10-CM

## 2015-08-05 LAB — RETICULOCYTES: Reticulocyte Count: 0.6 % (ref 0.6–2.6)

## 2015-08-05 LAB — IRON AND TIBC
%SAT: 22 % (ref 21–57)
Iron: 84 ug/dL (ref 41–142)
TIBC: 373 ug/dL (ref 236–444)
UIBC: 289 ug/dL (ref 120–384)

## 2015-08-05 LAB — VITAMIN B12: Vitamin B12: 193 pg/mL — ABNORMAL LOW (ref 211–946)

## 2015-08-05 LAB — FERRITIN: Ferritin: 12 ng/ml (ref 9–269)

## 2015-08-07 ENCOUNTER — Ambulatory Visit (HOSPITAL_BASED_OUTPATIENT_CLINIC_OR_DEPARTMENT_OTHER): Payer: 59

## 2015-08-07 VITALS — BP 111/66 | HR 80 | Temp 97.9°F | Resp 18

## 2015-08-07 DIAGNOSIS — D508 Other iron deficiency anemias: Secondary | ICD-10-CM | POA: Diagnosis not present

## 2015-08-07 MED ORDER — SODIUM CHLORIDE 0.9 % IV SOLN
510.0000 mg | Freq: Once | INTRAVENOUS | Status: AC
  Administered 2015-08-07: 510 mg via INTRAVENOUS
  Filled 2015-08-07: qty 17

## 2015-08-07 MED ORDER — SODIUM CHLORIDE 0.9 % IV SOLN
INTRAVENOUS | Status: DC
  Administered 2015-08-07: 15:00:00 via INTRAVENOUS

## 2015-08-07 NOTE — Patient Instructions (Signed)

## 2015-09-11 DIAGNOSIS — Z800 Family history of malignant neoplasm of digestive organs: Secondary | ICD-10-CM | POA: Insufficient documentation

## 2015-09-11 DIAGNOSIS — Z86010 Personal history of colonic polyps: Secondary | ICD-10-CM | POA: Insufficient documentation

## 2015-09-11 DIAGNOSIS — K529 Noninfective gastroenteritis and colitis, unspecified: Secondary | ICD-10-CM | POA: Insufficient documentation

## 2015-11-07 ENCOUNTER — Other Ambulatory Visit: Payer: 59

## 2015-11-07 ENCOUNTER — Ambulatory Visit: Payer: 59 | Admitting: Family

## 2015-11-14 ENCOUNTER — Ambulatory Visit: Payer: 59

## 2015-11-14 ENCOUNTER — Ambulatory Visit (HOSPITAL_BASED_OUTPATIENT_CLINIC_OR_DEPARTMENT_OTHER): Payer: 59 | Admitting: Family

## 2015-11-14 ENCOUNTER — Other Ambulatory Visit (HOSPITAL_BASED_OUTPATIENT_CLINIC_OR_DEPARTMENT_OTHER): Payer: 59

## 2015-11-14 VITALS — BP 143/84 | HR 88 | Temp 97.6°F | Resp 18 | Ht 68.0 in | Wt 213.0 lb

## 2015-11-14 DIAGNOSIS — D519 Vitamin B12 deficiency anemia, unspecified: Secondary | ICD-10-CM

## 2015-11-14 DIAGNOSIS — D508 Other iron deficiency anemias: Secondary | ICD-10-CM

## 2015-11-14 DIAGNOSIS — Z9884 Bariatric surgery status: Secondary | ICD-10-CM | POA: Diagnosis not present

## 2015-11-14 LAB — IRON AND TIBC
%SAT: 26 % (ref 21–57)
Iron: 79 ug/dL (ref 41–142)
TIBC: 308 ug/dL (ref 236–444)
UIBC: 229 ug/dL (ref 120–384)

## 2015-11-14 LAB — CBC WITH DIFFERENTIAL (CANCER CENTER ONLY)
BASO#: 0 10*3/uL (ref 0.0–0.2)
BASO%: 0.4 % (ref 0.0–2.0)
EOS%: 2.3 % (ref 0.0–7.0)
Eosinophils Absolute: 0.1 10*3/uL (ref 0.0–0.5)
HCT: 38.8 % (ref 34.8–46.6)
HGB: 13.1 g/dL (ref 11.6–15.9)
LYMPH#: 1.4 10*3/uL (ref 0.9–3.3)
LYMPH%: 30.3 % (ref 14.0–48.0)
MCH: 28.1 pg (ref 26.0–34.0)
MCHC: 33.8 g/dL (ref 32.0–36.0)
MCV: 83 fL (ref 81–101)
MONO#: 0.4 10*3/uL (ref 0.1–0.9)
MONO%: 7.6 % (ref 0.0–13.0)
NEUT#: 2.8 10*3/uL (ref 1.5–6.5)
NEUT%: 59.4 % (ref 39.6–80.0)
Platelets: 273 10*3/uL (ref 145–400)
RBC: 4.66 10*6/uL (ref 3.70–5.32)
RDW: 13.1 % (ref 11.1–15.7)
WBC: 4.7 10*3/uL (ref 3.9–10.0)

## 2015-11-14 LAB — FERRITIN: Ferritin: 28 ng/ml (ref 9–269)

## 2015-11-14 NOTE — Progress Notes (Signed)
No iron treatment today per Judson Roch, NP

## 2015-11-14 NOTE — Progress Notes (Signed)
Hematology and Oncology Follow Up Visit  Brandi Lutz [id number] [birth year] 40 y.o. 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 [name] doing fairly well but is under a great deal of stress at work and at home. She has noticed her hair falling out again and plans to follow-up with her dermatologist.  [name] symptomatic with fatigued and ice cravings. Her CBC today is 13.1 with an MCV of 83.  Her cycles are unchanged. She has had no other episodes of bleeding.  No fever, chills, n/v, cough, rash, SOB, chest pain, palpitations, abdominal pain or changes in bowel or bladder habits.  No lymphadenopathy found on exam.  No swelling, tenderness, numbness or tingling in her extremities. No c/o joint aches or pains at this time.  She has maintained a good appetite and is staying well hydrated. Her weight is stable.   Medications:    Medication List       This list is accurate as of: 11/14/15  1:46 PM.  Always use your most recent med list.               acetaminophen 500 MG tablet  Commonly known as:  TYLENOL  Take 1,000 mg by mouth every 8 (eight) hours as needed for mild pain or moderate pain.     buPROPion 150 MG 24 hr tablet  Commonly known as:  WELLBUTRIN XL  Take 150 mg by mouth daily.     multivitamin with minerals tablet  Take 1 tablet by mouth daily. Women's ultra Multi-vitamin     sertraline 50 MG tablet  Commonly known as:  ZOLOFT  Take 50 mg by mouth daily.        Allergies:  Allergies  Allergen Reactions  . Tape Rash    Past Medical History, Surgical history, Social history, and Family History were reviewed and updated.  Review of Systems: All other 10 point review of systems is negative.    Physical Exam:  vitals were not taken for this visit.  Wt Readings from Last 3 Encounters:  08/04/15 210 lb (95.255 kg)  05/02/15 203 lb (92.08 kg)  01/31/15 196 lb (88.905 kg)    Ocular: Sclerae unicteric, pupils equal, round and reactive to light Ear-nose-throat: Oropharynx clear, dentition fair Lymphatic: No cervical supraclavicular or axillary adenopathy Lungs no rales or rhonchi, good excursion bilaterally Heart regular rate and rhythm, no murmur appreciated Abd soft, nontender, positive bowel sounds, no liber or spleen palpated on exam MSK no focal spinal tenderness, no joint edema Neuro: non-focal, well-oriented, appropriate affect Breasts: Deferred  Lab Results  Component Value Date   WBC 4.2 08/04/2015   HGB 13.5 08/04/2015   HCT 40.4 08/04/2015   MCV 81 08/04/2015   PLT 295 08/04/2015   Lab Results  Component Value Date   FERRITIN 12 08/04/2015   IRON 84 08/04/2015   TIBC 373 08/04/2015   UIBC 289 08/04/2015   IRONPCTSAT 22 08/04/2015   Lab Results  Component Value Date   RETICCTPCT 0.7 05/02/2015   RBC 5.00 08/04/2015   RETICCTABS 31.3 05/02/2015   No results found for: KPAFRELGTCHN, LAMBDASER, KAPLAMBRATIO No results found for: IGGSERUM, IGA, IGMSERUM No results found for: TOTALPROTELP, ALBUMINELP, A1GS, A2GS, BETS, BETA2SER, GAMS,  MSPIKE, SPEI   Chemistry      Component Value Date/Time   NA 140 08/06/2014 0637   K 3.5 08/06/2014 0637   CL 108 08/06/2014 0637   CO2 30 08/06/2014 0637   BUN 7 08/06/2014 0637   CREATININE 0.79 08/06/2014 0637      Component Value Date/Time   CALCIUM 8.1* 08/06/2014 0637   ALKPHOS 77 08/05/2014 1031   AST 28 08/05/2014 1031   ALT 25 08/05/2014 1031   BILITOT 0.6 08/05/2014 1031     Impression and Plan: Brandi Lutz is a pleasant 40 yo African American female with iron deficiency anemia secondary to gastric bypass and malabsorption. Her last dose of Feraheme was in February. [name] symptomatic at this time with  fatigue and ice cravings.  Her CBC looks good. Hgb is now up to 13.1 with an MCV of 83.   We will see what her iron studies show and bring her back in next week for an infusion if needed.  We will go ahead and plan to see her back in 4 months for labs and follow-up.  She will contact us with any questions or concerns. We can certainly see her sooner if need be.   Eliezer Bottom, NP 6/2/20171:46 PM

## 2015-11-15 LAB — VITAMIN B12: Vitamin B12: 182 pg/mL — ABNORMAL LOW (ref 211–946)

## 2015-11-15 LAB — RETICULOCYTES: Reticulocyte Count: 1.1 % (ref 0.6–2.6)

## 2015-11-17 ENCOUNTER — Telehealth: Payer: Self-pay | Admitting: *Deleted

## 2015-11-17 NOTE — Telephone Encounter (Addendum)
Message left on cell phone voicemail.   ----- Message from Eliezer Bottom, NP sent at 11/14/2015  4:09 PM EDT ----- Regarding: Iron Her iron studies look good. No infusion needed at this time. Thank you!!!  [name]   ----- Message -----    From: [facility]    Sent: 11/14/2015   1:46 PM      To: Eliezer Bottom, NP

## 2016-03-09 ENCOUNTER — Ambulatory Visit: Payer: 59

## 2016-03-09 ENCOUNTER — Ambulatory Visit (HOSPITAL_BASED_OUTPATIENT_CLINIC_OR_DEPARTMENT_OTHER): Payer: 59 | Admitting: Family

## 2016-03-09 ENCOUNTER — Other Ambulatory Visit (HOSPITAL_BASED_OUTPATIENT_CLINIC_OR_DEPARTMENT_OTHER): Payer: 59

## 2016-03-09 VITALS — BP 120/82 | HR 77 | Temp 97.9°F | Resp 16 | Wt 221.0 lb

## 2016-03-09 DIAGNOSIS — D518 Other vitamin B12 deficiency anemias: Secondary | ICD-10-CM

## 2016-03-09 DIAGNOSIS — K9089 Other intestinal malabsorption: Secondary | ICD-10-CM | POA: Diagnosis not present

## 2016-03-09 DIAGNOSIS — D508 Other iron deficiency anemias: Secondary | ICD-10-CM

## 2016-03-09 DIAGNOSIS — Z9884 Bariatric surgery status: Secondary | ICD-10-CM | POA: Diagnosis not present

## 2016-03-09 LAB — CBC WITH DIFFERENTIAL (CANCER CENTER ONLY)
BASO#: 0 10*3/uL (ref 0.0–0.2)
BASO%: 0.7 % (ref 0.0–2.0)
EOS%: 2.4 % (ref 0.0–7.0)
Eosinophils Absolute: 0.1 10*3/uL (ref 0.0–0.5)
HCT: 35.6 % (ref 34.8–46.6)
HGB: 12 g/dL (ref 11.6–15.9)
LYMPH#: 1.4 10*3/uL (ref 0.9–3.3)
LYMPH%: 33 % (ref 14.0–48.0)
MCH: 27.6 pg (ref 26.0–34.0)
MCHC: 33.7 g/dL (ref 32.0–36.0)
MCV: 82 fL (ref 81–101)
MONO#: 0.4 10*3/uL (ref 0.1–0.9)
MONO%: 10 % (ref 0.0–13.0)
NEUT#: 2.3 10*3/uL (ref 1.5–6.5)
NEUT%: 53.9 % (ref 39.6–80.0)
Platelets: 258 10*3/uL (ref 145–400)
RBC: 4.35 10*6/uL (ref 3.70–5.32)
RDW: 13.1 % (ref 11.1–15.7)
WBC: 4.2 10*3/uL (ref 3.9–10.0)

## 2016-03-09 LAB — IRON AND TIBC
%SAT: 21 % (ref 21–57)
Iron: 69 ug/dL (ref 41–142)
TIBC: 331 ug/dL (ref 236–444)
UIBC: 262 ug/dL (ref 120–384)

## 2016-03-09 LAB — FERRITIN: Ferritin: 12 ng/ml (ref 9–269)

## 2016-03-09 NOTE — Progress Notes (Signed)
Hematology and Oncology Follow Up Visit  Brandi Lutz [id number] 1976/04/28 40 y.o. 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 [name] symptomatic at this time with fatigued and ice cravings. Her last dose of Feraheme was in February. Her iron saturation in June was 26% with a ferritin of 28. [name] just getting over a GI bug that caused quite a bit of diarrhea. Her symptoms have resolved but she is still easily fatigued.   No fever, chills, n/v, cough, rash, SOB, chest pain, palpitations, abdominal pain or changes in bowel or bladder habits.  No lymphadenopathy found on exam. No episodes of bleeding or bruising.  No swelling, tenderness, numbness or tingling in her extremities. No c/o joint aches or pains at this time.  She has maintained a good appetite and is more a of a grazer throughout the day. [name] staying well hydrated. Her weight is stable.   Medications:    Medication List       Accurate as of 03/09/16  3:40 PM. Always use your most recent med list.          acetaminophen 500 MG tablet Commonly known as:  TYLENOL Take 1,000 mg by mouth every 8 (eight) hours as needed for mild pain or moderate pain.   buPROPion 150 MG 24 hr tablet Commonly known as:  WELLBUTRIN XL Take 150 mg by mouth daily.   multivitamin with minerals tablet Take 1 tablet by mouth daily. Women's ultra Multi-vitamin   sertraline 50 MG tablet Commonly known as:  ZOLOFT Take 50 mg by mouth daily.       Allergies:  Allergies  Allergen Reactions  . Tape Rash    Past Medical History, Surgical history, Social history, and Family History were reviewed and updated.  Review of Systems: All other 10 point review of systems is  negative.   Physical Exam:  weight is 221 lb (100.2 kg). Her oral temperature is 97.9 F (36.6 C). Her blood pressure is 120/82 and her pulse is 77. Her respiration is 16.   Wt Readings from Last 3 Encounters:  03/09/16 221 lb (100.2 kg)  11/14/15 213 lb (96.6 kg)  08/04/15 210 lb (95.3 kg)    Ocular: Sclerae unicteric, pupils equal, round and reactive to light Ear-nose-throat: Oropharynx clear, dentition fair Lymphatic: No cervical supraclavicular or axillary adenopathy Lungs no rales or rhonchi, good excursion bilaterally Heart regular rate and rhythm, no murmur appreciated Abd soft, nontender, positive bowel sounds, no liber or spleen palpated on exam MSK no focal spinal tenderness, no joint edema Neuro: non-focal, well-oriented, appropriate affect Breasts: Deferred  Lab Results  Component Value Date   WBC 4.2 03/09/2016   HGB 12.0 03/09/2016   HCT 35.6 03/09/2016   MCV 82 03/09/2016   PLT 258 03/09/2016   Lab Results  Component Value Date   FERRITIN 12 03/09/2016   IRON 69 03/09/2016   TIBC 331 03/09/2016   UIBC 262 03/09/2016   IRONPCTSAT 21 03/09/2016   Lab Results  Component Value Date   RETICCTPCT 0.7 05/02/2015   RBC 4.35 03/09/2016   RETICCTABS 31.3 05/02/2015   No results found for: KPAFRELGTCHN, LAMBDASER, KAPLAMBRATIO No results found for: IGGSERUM, IGA, IGMSERUM No results found for:  Odetta Pink, SPEI   Chemistry      Component Value Date/Time   NA 140 08/06/2014 0637   K 3.5 08/06/2014 0637   CL 108 08/06/2014 0637   CO2 30 08/06/2014 0637   BUN 7 08/06/2014 0637   CREATININE 0.79 08/06/2014 0637      Component Value Date/Time   CALCIUM 8.1 (L) 08/06/2014 0637   ALKPHOS 77 08/05/2014 1031   AST 28 08/05/2014 1031   ALT 25 08/05/2014 1031   BILITOT 0.6 08/05/2014 1031     Impression and Plan: Brandi Lutz is a pleasant 40 yo African American female with iron deficiency anemia secondary  to gastric bypass and malabsorption. Her last dose of Feraheme was in February. [name] symptomatic again at this time with fatigue and ice cravings.   We will see what her iron studies show and bring her back in later this week for an infusion if needed.  Hgb is stable at 12.0 with an MCV of 82.  We will go ahead and plan to see her back in 4 months for labs and follow-up.  She will contact us with any questions or concerns. We can certainly see her sooner if need be.   Eliezer Bottom, NP 9/26/20173:40 PM

## 2016-03-10 ENCOUNTER — Other Ambulatory Visit: Payer: Self-pay | Admitting: *Deleted

## 2016-03-10 ENCOUNTER — Ambulatory Visit (HOSPITAL_BASED_OUTPATIENT_CLINIC_OR_DEPARTMENT_OTHER): Payer: 59

## 2016-03-10 ENCOUNTER — Ambulatory Visit: Payer: 59

## 2016-03-10 VITALS — BP 126/76 | HR 91 | Temp 98.1°F | Resp 20

## 2016-03-10 DIAGNOSIS — D508 Other iron deficiency anemias: Secondary | ICD-10-CM

## 2016-03-10 LAB — RETICULOCYTES: Reticulocyte Count: 1.1 % (ref 0.6–2.6)

## 2016-03-10 MED ORDER — SODIUM CHLORIDE 0.9 % IV SOLN
510.0000 mg | Freq: Once | INTRAVENOUS | Status: AC
  Administered 2016-03-10: 510 mg via INTRAVENOUS
  Filled 2016-03-10: qty 17

## 2016-03-10 MED ORDER — SODIUM CHLORIDE 0.9 % IV SOLN
INTRAVENOUS | Status: DC
  Administered 2016-03-10: 15:00:00 via INTRAVENOUS

## 2016-03-10 NOTE — Patient Instructions (Signed)

## 2016-03-15 ENCOUNTER — Ambulatory Visit: Payer: 59 | Admitting: Hematology & Oncology

## 2016-03-15 ENCOUNTER — Other Ambulatory Visit: Payer: 59

## 2016-03-18 ENCOUNTER — Ambulatory Visit (HOSPITAL_BASED_OUTPATIENT_CLINIC_OR_DEPARTMENT_OTHER): Payer: 59

## 2016-03-18 ENCOUNTER — Other Ambulatory Visit: Payer: Self-pay | Admitting: Family

## 2016-03-18 VITALS — BP 126/67 | HR 83 | Temp 97.8°F | Resp 20

## 2016-03-18 DIAGNOSIS — K9089 Other intestinal malabsorption: Secondary | ICD-10-CM

## 2016-03-18 DIAGNOSIS — K909 Intestinal malabsorption, unspecified: Secondary | ICD-10-CM

## 2016-03-18 DIAGNOSIS — Z9884 Bariatric surgery status: Secondary | ICD-10-CM | POA: Diagnosis not present

## 2016-03-18 DIAGNOSIS — D508 Other iron deficiency anemias: Secondary | ICD-10-CM

## 2016-03-18 DIAGNOSIS — D509 Iron deficiency anemia, unspecified: Secondary | ICD-10-CM | POA: Insufficient documentation

## 2016-03-18 MED ORDER — SODIUM CHLORIDE 0.9 % IV SOLN
Freq: Once | INTRAVENOUS | Status: AC
  Administered 2016-03-18: 15:00:00 via INTRAVENOUS

## 2016-03-18 MED ORDER — SODIUM CHLORIDE 0.9 % IV SOLN
510.0000 mg | Freq: Once | INTRAVENOUS | Status: AC
  Administered 2016-03-18: 510 mg via INTRAVENOUS
  Filled 2016-03-18: qty 17

## 2016-03-18 NOTE — Patient Instructions (Signed)

## 2016-06-03 ENCOUNTER — Other Ambulatory Visit: Payer: Self-pay | Admitting: *Deleted

## 2016-06-03 ENCOUNTER — Telehealth: Payer: Self-pay | Admitting: *Deleted

## 2016-06-03 DIAGNOSIS — K909 Intestinal malabsorption, unspecified: Secondary | ICD-10-CM

## 2016-06-03 NOTE — Telephone Encounter (Signed)
Patient feels very poorly. [name] fatigued and doesn't feel well. She would like to have her iron levels checked. Spoke to Judson Roch who wants patient to come in for lab work to check levels.   Scheduled patient for lab work. Patient aware of appointment

## 2016-06-04 ENCOUNTER — Other Ambulatory Visit (HOSPITAL_BASED_OUTPATIENT_CLINIC_OR_DEPARTMENT_OTHER): Payer: 59

## 2016-06-04 DIAGNOSIS — K909 Intestinal malabsorption, unspecified: Secondary | ICD-10-CM

## 2016-06-04 DIAGNOSIS — D508 Other iron deficiency anemias: Secondary | ICD-10-CM

## 2016-06-04 LAB — CBC WITH DIFFERENTIAL (CANCER CENTER ONLY)
BASO#: 0 10*3/uL (ref 0.0–0.2)
BASO%: 0.4 % (ref 0.0–2.0)
EOS%: 2.7 % (ref 0.0–7.0)
Eosinophils Absolute: 0.1 10*3/uL (ref 0.0–0.5)
HCT: 39.2 % (ref 34.8–46.6)
HGB: 13.3 g/dL (ref 11.6–15.9)
LYMPH#: 1.5 10*3/uL (ref 0.9–3.3)
LYMPH%: 29.2 % (ref 14.0–48.0)
MCH: 28.1 pg (ref 26.0–34.0)
MCHC: 33.9 g/dL (ref 32.0–36.0)
MCV: 83 fL (ref 81–101)
MONO#: 0.4 10*3/uL (ref 0.1–0.9)
MONO%: 7.4 % (ref 0.0–13.0)
NEUT#: 3.2 10*3/uL (ref 1.5–6.5)
NEUT%: 60.3 % (ref 39.6–80.0)
Platelets: 293 10*3/uL (ref 145–400)
RBC: 4.73 10*6/uL (ref 3.70–5.32)
RDW: 13.4 % (ref 11.1–15.7)
WBC: 5.3 10*3/uL (ref 3.9–10.0)

## 2016-06-08 LAB — FERRITIN: Ferritin: 57 ng/ml (ref 9–269)

## 2016-06-08 LAB — IRON AND TIBC
%SAT: 20 % — ABNORMAL LOW (ref 21–57)
Iron: 60 ug/dL (ref 41–142)
TIBC: 294 ug/dL (ref 236–444)
UIBC: 234 ug/dL (ref 120–384)

## 2016-06-09 ENCOUNTER — Telehealth: Payer: Self-pay | Admitting: *Deleted

## 2016-06-09 NOTE — Telephone Encounter (Signed)
-----   Message from Eliezer Bottom, NP sent at 06/08/2016 11:25 AM EST ----- Regarding: iron  Iron low. Needs one dose of IV iron this week please. Thank you!  [name]  ----- Message ----- From: Volanda Napoleon, MD Sent: 06/08/2016  11:09 AM To: Eliezer Bottom, NP    ----- Message ----- From: Interface, Lab In Three Zero One Sent: 06/04/2016   2:55 PM To: Volanda Napoleon, MD

## 2016-06-10 ENCOUNTER — Telehealth: Payer: Self-pay

## 2016-06-10 NOTE — Telephone Encounter (Addendum)
-----   Message from Brandi Bottom, NP sent at 06/08/2016 11:25 AM EST ----- Regarding: iron  Iron low. Needs one dose of IV iron this week please. Thank you!  [name]  Pt returned our previous call for lab results. Above message given to pt, then transferred to scheduler to make appt. dph

## 2016-06-17 ENCOUNTER — Ambulatory Visit (HOSPITAL_BASED_OUTPATIENT_CLINIC_OR_DEPARTMENT_OTHER): Payer: 59

## 2016-06-17 VITALS — BP 127/78 | HR 85 | Temp 97.7°F | Resp 18

## 2016-06-17 DIAGNOSIS — D508 Other iron deficiency anemias: Secondary | ICD-10-CM | POA: Diagnosis not present

## 2016-06-17 DIAGNOSIS — Z9884 Bariatric surgery status: Secondary | ICD-10-CM

## 2016-06-17 DIAGNOSIS — K909 Intestinal malabsorption, unspecified: Secondary | ICD-10-CM

## 2016-06-17 MED ORDER — SODIUM CHLORIDE 0.9 % IV SOLN
Freq: Once | INTRAVENOUS | Status: AC
  Administered 2016-06-17: 15:00:00 via INTRAVENOUS

## 2016-06-17 MED ORDER — FERUMOXYTOL INJECTION 510 MG/17 ML
510.0000 mg | Freq: Once | INTRAVENOUS | Status: AC
  Administered 2016-06-17: 510 mg via INTRAVENOUS
  Filled 2016-06-17: qty 17

## 2016-06-17 NOTE — Patient Instructions (Signed)

## 2016-07-09 ENCOUNTER — Ambulatory Visit: Payer: 59 | Admitting: Family

## 2016-07-09 ENCOUNTER — Other Ambulatory Visit: Payer: 59

## 2016-09-29 ENCOUNTER — Other Ambulatory Visit (HOSPITAL_BASED_OUTPATIENT_CLINIC_OR_DEPARTMENT_OTHER): Payer: 59

## 2016-09-29 ENCOUNTER — Ambulatory Visit (HOSPITAL_BASED_OUTPATIENT_CLINIC_OR_DEPARTMENT_OTHER): Payer: 59 | Admitting: Family

## 2016-09-29 VITALS — BP 142/76 | HR 78 | Temp 98.1°F | Resp 17 | Wt 228.0 lb

## 2016-09-29 DIAGNOSIS — K909 Intestinal malabsorption, unspecified: Secondary | ICD-10-CM

## 2016-09-29 DIAGNOSIS — Z9884 Bariatric surgery status: Secondary | ICD-10-CM

## 2016-09-29 DIAGNOSIS — D508 Other iron deficiency anemias: Secondary | ICD-10-CM

## 2016-09-29 LAB — CBC WITH DIFFERENTIAL (CANCER CENTER ONLY)
BASO#: 0 10*3/uL (ref 0.0–0.2)
BASO%: 0.5 % (ref 0.0–2.0)
EOS%: 3 % (ref 0.0–7.0)
Eosinophils Absolute: 0.1 10*3/uL (ref 0.0–0.5)
HCT: 39.1 % (ref 34.8–46.6)
HGB: 13.2 g/dL (ref 11.6–15.9)
LYMPH#: 1.4 10*3/uL (ref 0.9–3.3)
LYMPH%: 35.4 % (ref 14.0–48.0)
MCH: 28.3 pg (ref 26.0–34.0)
MCHC: 33.8 g/dL (ref 32.0–36.0)
MCV: 84 fL (ref 81–101)
MONO#: 0.4 10*3/uL (ref 0.1–0.9)
MONO%: 9.3 % (ref 0.0–13.0)
NEUT#: 2.1 10*3/uL (ref 1.5–6.5)
NEUT%: 51.8 % (ref 39.6–80.0)
Platelets: 244 10*3/uL (ref 145–400)
RBC: 4.67 10*6/uL (ref 3.70–5.32)
RDW: 12.3 % (ref 11.1–15.7)
WBC: 4 10*3/uL (ref 3.9–10.0)

## 2016-09-29 LAB — FERRITIN: Ferritin: 95 ng/ml (ref 9–269)

## 2016-09-29 LAB — IRON AND TIBC
%SAT: 33 % (ref 21–57)
Iron: 97 ug/dL (ref 41–142)
TIBC: 293 ug/dL (ref 236–444)
UIBC: 195 ug/dL (ref 120–384)

## 2016-09-29 NOTE — Progress Notes (Signed)
Hematology and Oncology Follow Up Visit  Brandi Lutz [id number] [birth year] 41 y.o. 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 Brandi Lutz is here today for follow-up. [name] doing well. She has seen a fertility specialist and has started ovulation treatments and IUI. She had her first IUI and became pregnant. Unfortunately, she miscarried at 31 and half weeks. Her second treatment was unsuccessful. [name] preparing for a third treatment soon. [name] feeling fatigued and weak and wants to make sure she is not iron deficient before proceeding.  Her cycles have been regular and not heavy.  No fever, chills, n/v, cough, rash, SOB, chest pain, palpitations, abdominal pain or changes in bowel or bladder habits. Since her gastric bypass she has fluctuated between constipation and diarrhea. This is unchanged from her baseline.  No lymphadenopathy found on exam. No bleeding, bruising or petechiae.   No swelling, tenderness, numbness or tingling in her extremities. No c/o joint aches or pains at this time.  [name] eating well and staying hydrated. Her weight is stable.   ECOG Performance Status: 1 - Symptomatic but completely ambulatory  Medications:  Allergies as of 09/29/2016      Reactions   Tape Rash      Medication List       Accurate as of 09/29/16 11:04 AM. Always use your most recent med list.          acetaminophen 500 MG tablet Commonly known as:  TYLENOL Take 1,000 mg by mouth every 8 (eight) hours as needed for mild pain or moderate pain.   buPROPion 150 MG 24 hr tablet Commonly known as:  WELLBUTRIN XL Take 150 mg by mouth daily.   IRON PO Take by mouth.   multivitamin with minerals tablet Take 1 tablet by mouth daily. Women's ultra  Multi-vitamin   sertraline 50 MG tablet Commonly known as:  ZOLOFT Take 50 mg by mouth daily.       Allergies:  Allergies  Allergen Reactions  . Tape Rash    Past Medical History, Surgical history, Social history, and Family History were reviewed and updated.  Review of Systems: All other 10 point review of systems is negative.   Physical Exam:  weight is 228 lb (103.4 kg). Her oral temperature is 98.1 F (36.7 C). Her blood pressure is 142/76 (abnormal) and her pulse is 78. Her respiration is 17 and oxygen saturation is 100%.   Wt Readings from Last 3 Encounters:  09/29/16 228 lb (103.4 kg)  03/09/16 221 lb (100.2 kg)  11/14/15 213 lb (96.6 kg)    Ocular: Sclerae unicteric, pupils equal, round and reactive to light Ear-nose-throat: Oropharynx clear, dentition fair Lymphatic: No cervical, supraclavicular or axillary adenopathy Lungs no rales or rhonchi, good excursion bilaterally Heart regular rate and rhythm, no murmur appreciated Abd soft, nontender, positive bowel sounds, no liver or spleen tip palpated on exam, no fluid wave MSK no focal spinal tenderness, no joint edema Neuro: non-focal, well-oriented, appropriate affect Breasts: Deferred   Lab Results  Component Value Date   WBC 4.0 09/29/2016   HGB 13.2 09/29/2016   HCT 39.1 09/29/2016   MCV 84 09/29/2016   PLT 244 09/29/2016   Lab Results  Component Value Date   FERRITIN 57 06/04/2016  IRON 60 06/04/2016   TIBC 294 06/04/2016   UIBC 234 06/04/2016   IRONPCTSAT 20 (L) 06/04/2016   Lab Results  Component Value Date   RETICCTPCT 0.7 05/02/2015   RBC 4.67 09/29/2016   RETICCTABS 31.3 05/02/2015   No results found for: KPAFRELGTCHN, LAMBDASER, KAPLAMBRATIO No results found for: IGGSERUM, IGA, IGMSERUM No results found for: Odetta Pink, SPEI   Chemistry      Component Value Date/Time   NA 140 08/06/2014 0637   K 3.5 08/06/2014 0637   CL 108  08/06/2014 0637   CO2 30 08/06/2014 0637   BUN 7 08/06/2014 0637   CREATININE 0.79 08/06/2014 0637      Component Value Date/Time   CALCIUM 8.1 (L) 08/06/2014 0637   ALKPHOS 77 08/05/2014 1031   AST 28 08/05/2014 1031   ALT 25 08/05/2014 1031   BILITOT 0.6 08/05/2014 1031      Impression and Plan: Ms. Brandi Lutz is a pleasant 41 yo African American female with iron deficiency anemia secondary to gastric bypass and malabsorption. [name] symptomatic with fatigue and weakness at this time. Hgb is stable at 13.2 with an MCV of 84. [name] taking folic acid as part of her ovulation and IUI treatment plan. She would like to make sure that she is not iron deficient prior to proceeding with her 3rd treatment. .   We will see what her iron studies show and bring her back in later this week for an infusion if needed.  I spent 15 minutes face to face counseling with the patient.  We will go ahead and plan to see her back in 3 months for labs and follow-up.  She will contact us with any questions or concerns. We can certainly see her sooner if need be.   Eliezer Bottom, NP 4/18/201811:04 AM

## 2016-09-30 ENCOUNTER — Ambulatory Visit: Payer: 59

## 2016-09-30 LAB — RETICULOCYTES: Reticulocyte Count: 1 % (ref 0.6–2.6)

## 2016-10-01 ENCOUNTER — Telehealth: Payer: Self-pay | Admitting: *Deleted

## 2016-10-01 NOTE — Telephone Encounter (Addendum)
Patient is aware of results  ----- Message from Eliezer Bottom, NP sent at 09/30/2016  6:20 PM EDT ----- Regarding: iron  Iron studies look good! No infusion needed at this time. Fatigue may be associated with the Metformin she is taking to increase her chances of becoming pregnant with IUI? Is she checking her blood sugars? May be low. Thank you!  [name]  ----- Message ----- From: Interface, Lab In Three Zero One Sent: 09/29/2016  10:49 AM To: Eliezer Bottom, NP

## 2016-12-29 ENCOUNTER — Other Ambulatory Visit: Payer: 59

## 2016-12-29 ENCOUNTER — Ambulatory Visit: Payer: 59 | Admitting: Family

## 2017-06-20 ENCOUNTER — Encounter (INDEPENDENT_AMBULATORY_CARE_PROVIDER_SITE_OTHER): Payer: Self-pay | Admitting: Orthopaedic Surgery

## 2017-06-20 ENCOUNTER — Ambulatory Visit (INDEPENDENT_AMBULATORY_CARE_PROVIDER_SITE_OTHER): Payer: Managed Care, Other (non HMO) | Admitting: Orthopaedic Surgery

## 2017-06-20 ENCOUNTER — Ambulatory Visit (INDEPENDENT_AMBULATORY_CARE_PROVIDER_SITE_OTHER): Payer: Managed Care, Other (non HMO)

## 2017-06-20 VITALS — Ht 68.0 in | Wt 230.0 lb

## 2017-06-20 DIAGNOSIS — M25562 Pain in left knee: Secondary | ICD-10-CM

## 2017-06-20 NOTE — Progress Notes (Signed)
Office Visit Note   Patient: Brandi Lutz           Date of Birth: 1975/12/26           MRN: [id number] Visit Date: 06/20/2017              Requested by: Benito Mccreedy, MD [id number] [place], [place] [address] PCP: Benito Mccreedy, MD   Assessment & Plan: Visit Diagnoses:  1. Acute pain of left knee     Plan: Impression is 42 year old female with left knee bloody effusion status post mechanical fall.  40 cc of bloody effusion were aspirated.  [facility] MRI of the left knee to rule out structural abnormalities and occult fracture.  Follow-up in about 2 weeks.  Follow-Up Instructions: Return in about 2 weeks (around 07/04/2017).   Orders:  Orders Placed This Encounter  Procedures  . XR Knee 1-2 Views Left  . MR Knee Left w/o contrast   No orders of the defined types were placed in this encounter.     Procedures: Large Joint Inj: L knee on 06/20/2017 3:57 PM Details: 18 G needle Aspirate: 40 mL bloody Outcome: tolerated well, no immediate complications Patient was prepped and draped in the usual sterile fashion.       Clinical Data: No additional findings.   Subjective: Chief Complaint  Patient presents with  . Left Knee - Pain    Patient is a 42 year old female who is 3 years status post left unicompartmental knee arthroplasty with an acute injury couple days ago where she hyperflexed her knee and felt a cracking sensation.  She has developed pain and swelling and difficulty with range of motion as a result of this.  Denies any numbness and tingling.  Up until then she was doing fine.    Review of Systems  Constitutional: Negative.   HENT: Negative.   Eyes: Negative.   Respiratory: Negative.   Cardiovascular: Negative.   Endocrine: Negative.   Musculoskeletal: Negative.   Neurological: Negative.   Hematological: Negative.   Psychiatric/Behavioral: Negative.   All other systems reviewed and are negative.    Objective: Vital  Signs: Ht 5\' 8"  (1.727 m)   Wt 230 lb (104.3 kg)   BMI 34.97 kg/m   Physical Exam  Constitutional: [name] oriented to person, place, and time. She appears well-developed and well-nourished.  Pulmonary/Chest: Effort normal.  Neurological: [name] alert and oriented to person, place, and time.  Skin: Skin is warm. Capillary refill takes less than 2 seconds.  Psychiatric: She has a normal mood and affect. Her behavior is normal. Judgment and thought content normal.  Nursing note and vitals reviewed.   Ortho Exam Left knee exam shows a fully healed surgical scar.  No signs of infection.  She has a large joint effusion.  Collaterals and cruciates are stable. Specialty Comments:  No specialty comments available.  Imaging: Xr Knee 1-2 Views Left  Result Date: 06/20/2017 No acute problems with implant.  Joint effusion.  Arthritis of knee.    PMFS History: Patient Active Problem List   Diagnosis Date Noted  . IDA (iron deficiency anemia) 03/18/2016  . 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 Localized osteoarthritis of left knee 08/05/2014  . Status post left partial knee replacement 08/05/2014  . Other iron deficiency anemias 02/05/2013  . Gastric bypass status for obesity 02/05/2013   Past Medical History:  Diagnosis Date  . Anxiety   . Arthritis   . Cancer ([place])  colon  . Depression    takes Wellbutrin and Zoloft daily  . Gastric bypass status for obesity 02/05/2013  . History of colon polyps    benign  . History of migraine    none since high school  . Joint pain   . Joint swelling   . Other specified iron deficiency anemias 02/05/2013   2 iron infusions since gastric bypass;no abnormal reaction noted  . PONV (postoperative nausea and vomiting)   . S/P left unicompartmental knee replacement 08/05/2014    No family history on file.  Past Surgical History:  Procedure Laterality Date  . ARTHROSCOPIC REPAIR ACL Left 20 yrs ago  . CHOLECYSTECTOMY    . COLECTOMY    .  COLONOSCOPY    . GASTRIC BYPASS    . herniated bowel repair    . KNEE ARTHROSCOPY Left   . PARTIAL KNEE ARTHROPLASTY Left 08/05/2014   Procedure: LEFT UNICOMPARTMENTAL KNEE;  Surgeon: Marianna Payment, MD;  Location: [facility];  Service: Orthopedics;  Laterality: Left;   Social History   Occupational History  . Not on file  Tobacco Use  . Smoking status: Never Smoker  . Smokeless tobacco: Never Used  Substance and Sexual Activity  . Alcohol use: Yes    Alcohol/week: 0.0 oz    Comment: rarely  . Drug use: No  . Sexual activity: Not Currently

## 2017-06-25 ENCOUNTER — Other Ambulatory Visit (HOSPITAL_BASED_OUTPATIENT_CLINIC_OR_DEPARTMENT_OTHER): Payer: Self-pay

## 2017-06-27 ENCOUNTER — Encounter (INDEPENDENT_AMBULATORY_CARE_PROVIDER_SITE_OTHER): Payer: Self-pay

## 2017-06-27 ENCOUNTER — Telehealth (INDEPENDENT_AMBULATORY_CARE_PROVIDER_SITE_OTHER): Payer: Self-pay | Admitting: Orthopaedic Surgery

## 2017-06-27 NOTE — Telephone Encounter (Signed)
Patient would like to know if MRI results are in, she had it done outside of [facility]. Also, if they are in are you able to give her the results over the phone if nothing major is wrong? She states it is $60 per visit and that's too much to pay if her knee does not need any additional work. Please advise # [phone]

## 2017-06-27 NOTE — Telephone Encounter (Signed)
See message below °

## 2017-06-27 NOTE — Telephone Encounter (Signed)
Sure we can discuss results over the phone.  Where did she get her MRI.

## 2017-06-28 ENCOUNTER — Telehealth (INDEPENDENT_AMBULATORY_CARE_PROVIDER_SITE_OTHER): Payer: Self-pay | Admitting: Orthopaedic Surgery

## 2017-06-28 NOTE — Telephone Encounter (Signed)
I called and advised her that you were seeing patients today and you would call her tonight.  Patient states she gets off work at 7:30pm  if no,  answer please leave her a detailed message. Thank you.

## 2017-06-28 NOTE — Telephone Encounter (Signed)
I left voicemail.

## 2017-06-28 NOTE — Telephone Encounter (Signed)
Patient called VERY eager to know the results of her MRI. Doesn't want to make an appointment if anything is wrong. If you could give her a call back with the results. Thank you. CB # [phone]

## 2017-06-28 NOTE — Telephone Encounter (Signed)
Patient called and wanted to know if MRI results were in. If so can she be called to let her know so she can make an appt to get results.  It is ok to leave message on her cell per patient.

## 2017-06-28 NOTE — Telephone Encounter (Signed)
Copy of the MRI given to you.

## 2017-06-30 ENCOUNTER — Telehealth (INDEPENDENT_AMBULATORY_CARE_PROVIDER_SITE_OTHER): Payer: Self-pay | Admitting: Orthopaedic Surgery

## 2017-06-30 NOTE — Telephone Encounter (Signed)
Patient left a voicemail needing to confirm that you received her forms and $25 payment that she brought in 01/11. Could you call patient back to advise? CB # [phone]

## 2017-07-12 ENCOUNTER — Other Ambulatory Visit: Payer: Self-pay

## 2017-07-12 ENCOUNTER — Ambulatory Visit: Payer: Self-pay | Admitting: Family

## 2017-09-21 ENCOUNTER — Inpatient Hospital Stay: Payer: Managed Care, Other (non HMO) | Attending: Hematology & Oncology

## 2017-09-21 ENCOUNTER — Encounter: Payer: Self-pay | Admitting: Family

## 2017-09-21 ENCOUNTER — Inpatient Hospital Stay (HOSPITAL_BASED_OUTPATIENT_CLINIC_OR_DEPARTMENT_OTHER): Payer: Managed Care, Other (non HMO) | Admitting: Family

## 2017-09-21 ENCOUNTER — Other Ambulatory Visit: Payer: Self-pay

## 2017-09-21 VITALS — BP 124/73 | HR 99 | Temp 97.7°F | Resp 18 | Wt 232.3 lb

## 2017-09-21 DIAGNOSIS — Z884 Allergy status to anesthetic agent status: Secondary | ICD-10-CM | POA: Diagnosis not present

## 2017-09-21 DIAGNOSIS — Z9884 Bariatric surgery status: Secondary | ICD-10-CM | POA: Diagnosis not present

## 2017-09-21 DIAGNOSIS — Z79899 Other long term (current) drug therapy: Secondary | ICD-10-CM | POA: Diagnosis not present

## 2017-09-21 DIAGNOSIS — K909 Intestinal malabsorption, unspecified: Secondary | ICD-10-CM | POA: Insufficient documentation

## 2017-09-21 DIAGNOSIS — D509 Iron deficiency anemia, unspecified: Secondary | ICD-10-CM | POA: Diagnosis not present

## 2017-09-21 DIAGNOSIS — D510 Vitamin B12 deficiency anemia due to intrinsic factor deficiency: Secondary | ICD-10-CM

## 2017-09-21 DIAGNOSIS — D508 Other iron deficiency anemias: Secondary | ICD-10-CM

## 2017-09-21 LAB — FERRITIN: Ferritin: 12 ng/mL (ref 9–269)

## 2017-09-21 LAB — CBC WITH DIFFERENTIAL (CANCER CENTER ONLY)
Basophils Absolute: 0 10*3/uL (ref 0.0–0.1)
Basophils Relative: 0 %
Eosinophils Absolute: 0.1 10*3/uL (ref 0.0–0.5)
Eosinophils Relative: 2 %
HCT: 37.1 % (ref 34.8–46.6)
Hemoglobin: 12.3 g/dL (ref 11.6–15.9)
Lymphocytes Relative: 32 %
Lymphs Abs: 1.5 10*3/uL (ref 0.9–3.3)
MCH: 26.7 pg (ref 26.0–34.0)
MCHC: 33.2 g/dL (ref 32.0–36.0)
MCV: 80.7 fL — ABNORMAL LOW (ref 81.0–101.0)
Monocytes Absolute: 0.3 10*3/uL (ref 0.1–0.9)
Monocytes Relative: 7 %
Neutro Abs: 2.7 10*3/uL (ref 1.5–6.5)
Neutrophils Relative %: 59 %
Platelet Count: 258 10*3/uL (ref 145–400)
RBC: 4.6 MIL/uL (ref 3.70–5.32)
RDW: 14.4 % (ref 11.1–15.7)
WBC Count: 4.5 10*3/uL (ref 3.9–10.0)

## 2017-09-21 LAB — IRON AND TIBC
Iron: 74 ug/dL (ref 41–142)
Saturation Ratios: 22 % (ref 21–57)
TIBC: 345 ug/dL (ref 236–444)
UIBC: 271 ug/dL

## 2017-09-21 LAB — RETICULOCYTES
RBC.: 4.61 MIL/uL (ref 3.70–5.45)
Retic Count, Absolute: 36.9 10*3/uL (ref 33.7–90.7)
Retic Ct Pct: 0.8 % (ref 0.7–2.1)

## 2017-09-21 NOTE — Progress Notes (Signed)
Hematology and Oncology Follow Up Visit  Brandi Lutz [id number] 05-26-1976 42 y.o. 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 [name] symptomatic with chewing ice and mild fatigue.  She has had some abdominal pain and states that her work up with GI was normal. She has history of herniated bowel and has made an appointment to follow-up with her surgeon that did her gastric bypass for further evaluation.  No episodes of bleeding, no bruising or petechiae. No lymphadenopathy found on exam.  She has had no fever, chills, n/v, cough, rash, dizziness, SOB, chest pain, palpitations, abdominal pain or changes in bowel or bladder habits.  No swelling, tenderness, numbness or tingling in her extremities. No c/o pain at this time.  She has a good appetite and is staying hydrated. Her weight is stable.   ECOG Performance Status: 1 - Symptomatic but completely ambulatory  Medications:  Allergies as of 09/21/2017      Reactions   Tape Rash      Medication List        Accurate as of 09/21/17  1:16 PM. Always use your most recent med list.          acetaminophen 500 MG tablet Commonly known as:  TYLENOL Take 1,000 mg by mouth every 8 (eight) hours as needed for mild pain or moderate pain.   buPROPion 150 MG 24 hr tablet Commonly known as:  WELLBUTRIN XL Take 150 mg by mouth daily.   IRON PO Take by mouth.   multivitamin with minerals tablet Take 1 tablet by mouth daily. Women's ultra Multi-vitamin   sertraline 50 MG tablet Commonly known as:  ZOLOFT Take 50 mg by mouth daily.       Allergies:  Allergies  Allergen Reactions  . Tape Rash    Past Medical History, Surgical history, Social history, and Family  History were reviewed and updated.  Review of Systems: All other 10 point review of systems is negative.   Physical Exam:  weight is 232 lb 5 oz (105.4 kg). Her oral temperature is 97.7 F (36.5 C). Her blood pressure is 124/73 and her pulse is 99. Her respiration is 18 and oxygen saturation is 100%.   Wt Readings from Last 3 Encounters:  09/21/17 232 lb 5 oz (105.4 kg)  06/20/17 230 lb (104.3 kg)  09/29/16 228 lb (103.4 kg)    Ocular: Sclerae unicteric, pupils equal, round and reactive to light Ear-nose-throat: Oropharynx clear, dentition fair Lymphatic: No cervical, supraclavicular or axillary adenopathy Lungs no rales or rhonchi, good excursion bilaterally Heart regular rate and rhythm, no murmur appreciated Abd soft, nontender, positive bowel sounds, no liver or spleen tip palpated on exam, no fluid wave  MSK no focal spinal tenderness, no joint edema Neuro: non-focal, well-oriented, appropriate affect Breasts: Deferred   Lab Results  Component Value Date   WBC 4.5 09/21/2017   HGB 13.2 09/29/2016   HCT 37.1 09/21/2017   MCV 80.7 (L) 09/21/2017   PLT 258 09/21/2017   Lab Results  Component Value Date   FERRITIN 95 09/29/2016   IRON 97 09/29/2016   TIBC 293 09/29/2016   UIBC 195 09/29/2016   IRONPCTSAT 33 09/29/2016   Lab Results  Component Value Date  RETICCTPCT 0.7 05/02/2015   RBC 4.60 09/21/2017   RETICCTABS 31.3 05/02/2015   No results found for: KPAFRELGTCHN, LAMBDASER, KAPLAMBRATIO No results found for: IGGSERUM, IGA, IGMSERUM No results found for: Odetta Pink, SPEI   Chemistry      Component Value Date/Time   NA 140 08/06/2014 0637   K 3.5 08/06/2014 0637   CL 108 08/06/2014 0637   CO2 30 08/06/2014 0637   BUN 7 08/06/2014 0637   CREATININE 0.79 08/06/2014 0637      Component Value Date/Time   CALCIUM 8.1 (L) 08/06/2014 0637   ALKPHOS 77 08/05/2014 1031   AST 28 08/05/2014 1031   ALT 25  08/05/2014 1031   BILITOT 0.6 08/05/2014 1031     Impression and Plan: Brandi Lutz is a pleasant 42 yo African American female with iron deficiency anemia secondary to gastric bypass and malabsorption. [name] symptomatic with fatigue and chewing ice.  We will see what her iron studies show and bring her back in for infusion if needed.  She will continues to follow-up with our office as needed. She will contact us with any questions or concerns. We can certainly see her any time she may need [place].   Laverna Peace, NP 4/10/20191:16 PM

## 2017-09-30 IMAGING — MG MM DIGITAL SCREENING BILAT
5 series · 5 of 5 positions shown · non-contrast
Comparison: Previous exam(s).

CLINICAL DATA: Screening.

EXAM:
DIGITAL SCREENING BILATERAL MAMMOGRAM WITH CAD

[L CC]
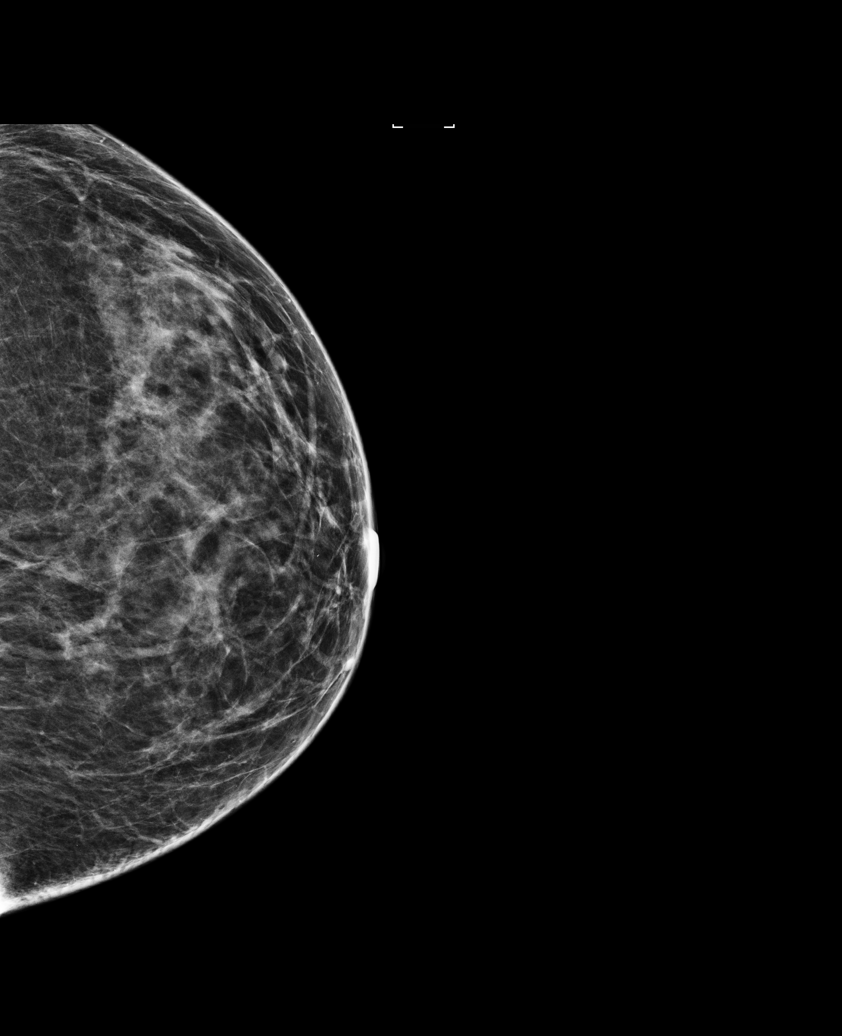

[R MLO]
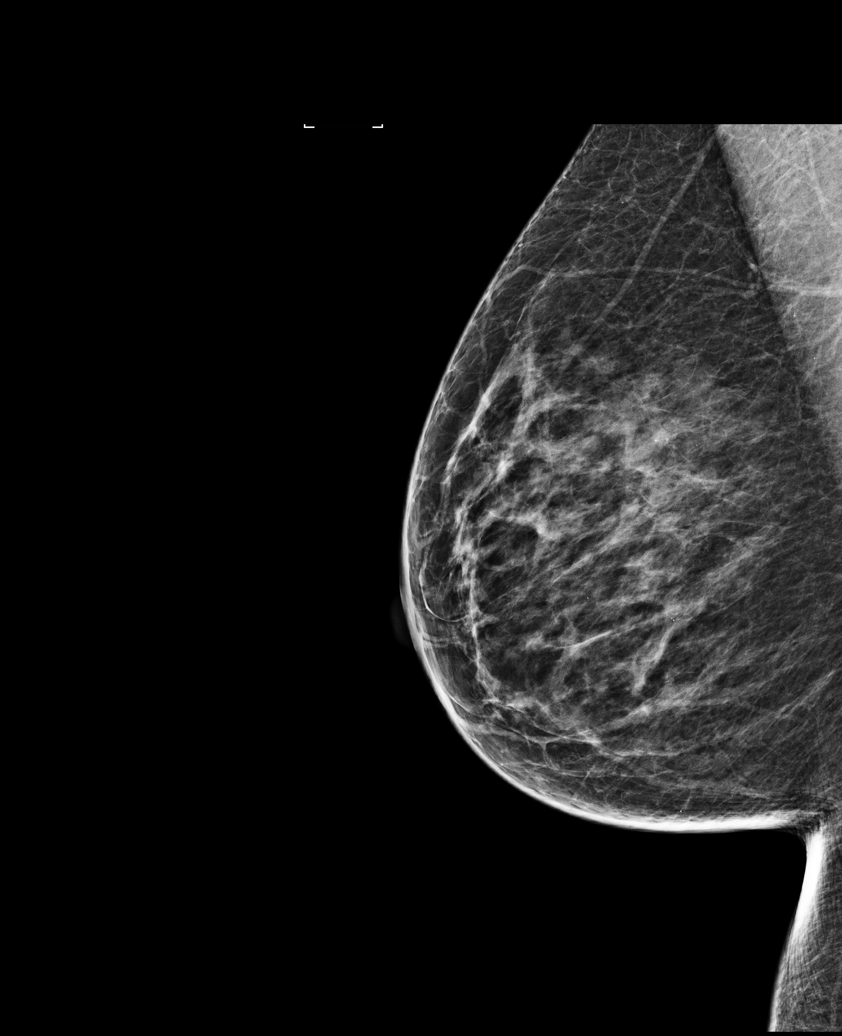

[L MLO]
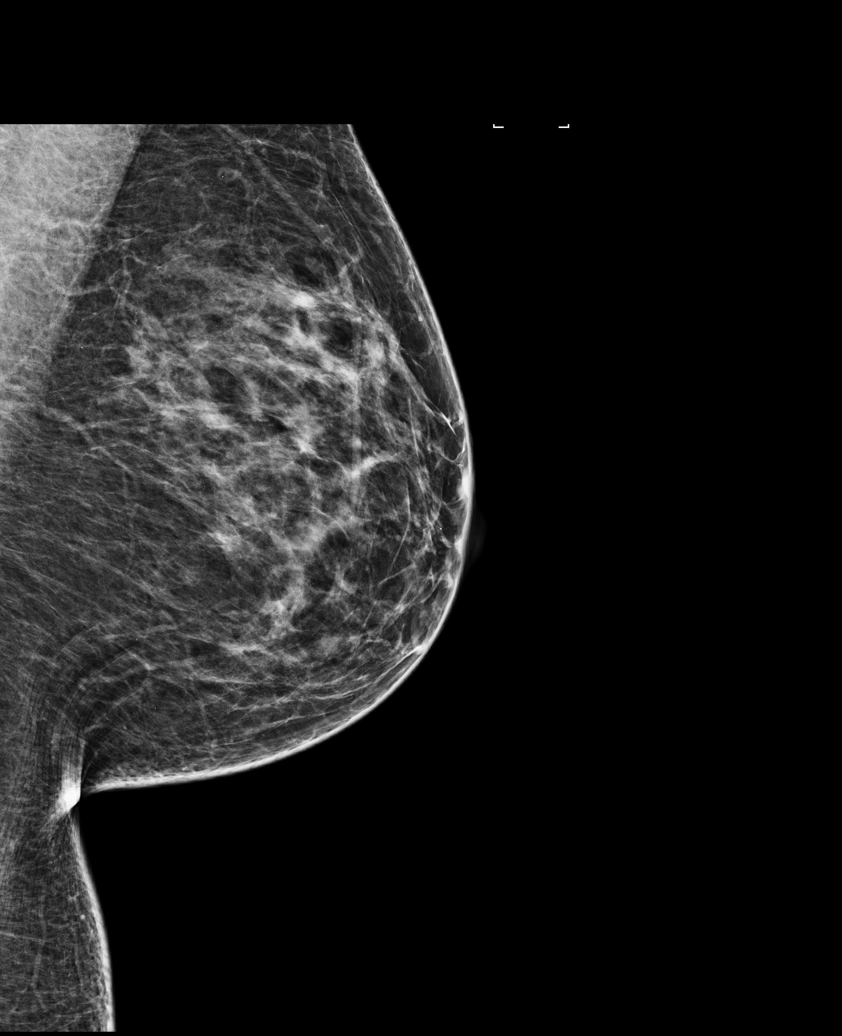

[R CC (1 of 2)]
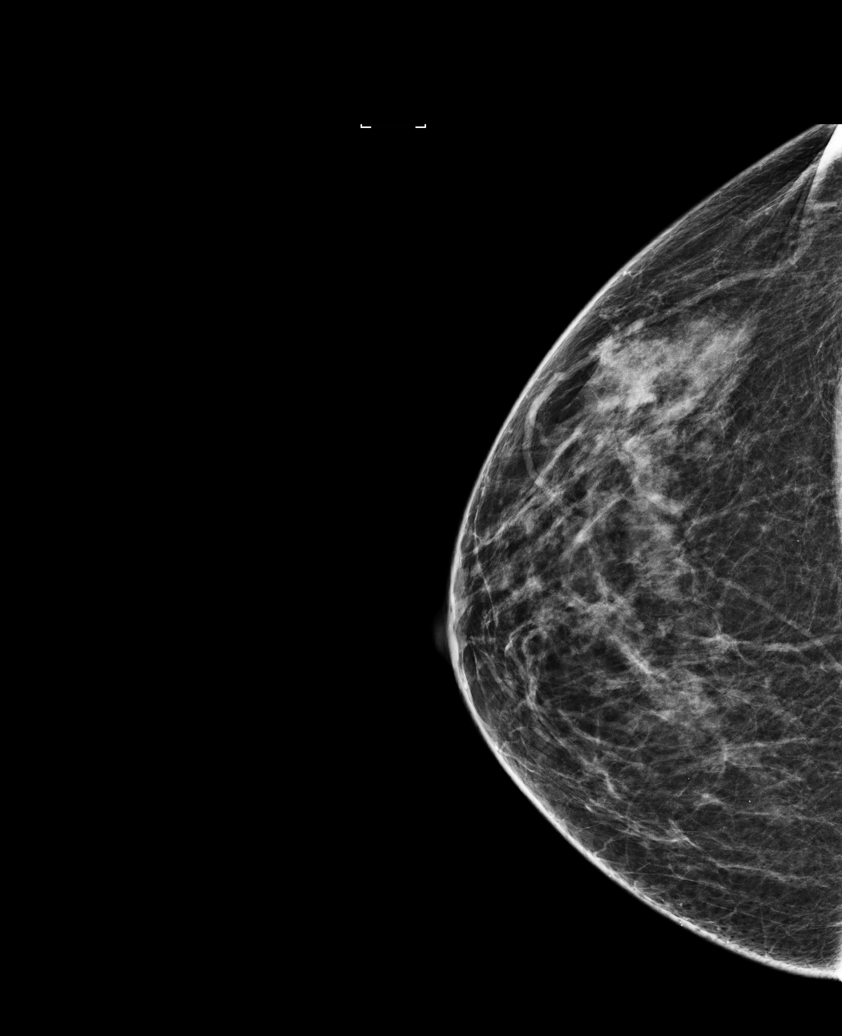

[R CC (2 of 2)]
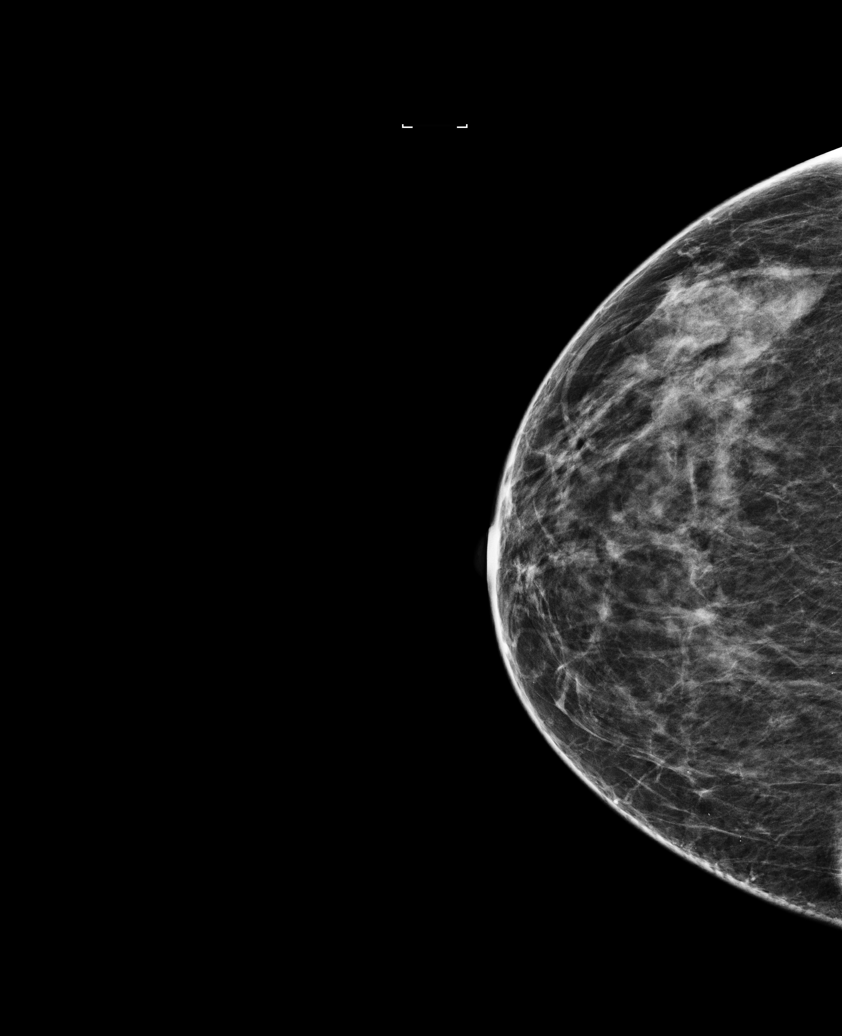

[5 of 5 positions shown; findings below may reference images not displayed]

ACR Breast Density Category b: There are scattered areas of
fibroglandular density.
FINDINGS: There are no findings suspicious for malignancy. Images were
processed with CAD.
IMPRESSION: No mammographic evidence of malignancy. A result letter of this
screening mammogram will be mailed directly to the patient.

RECOMMENDATION:
Screening mammogram in one year. (Code:AS-G-LCT)

BI-RADS CATEGORY  1: Negative.

## 2017-10-19 ENCOUNTER — Inpatient Hospital Stay: Payer: Managed Care, Other (non HMO) | Attending: Hematology & Oncology

## 2017-10-19 DIAGNOSIS — Z9884 Bariatric surgery status: Secondary | ICD-10-CM | POA: Diagnosis not present

## 2017-10-19 DIAGNOSIS — Z79899 Other long term (current) drug therapy: Secondary | ICD-10-CM | POA: Insufficient documentation

## 2017-10-19 DIAGNOSIS — K909 Intestinal malabsorption, unspecified: Secondary | ICD-10-CM | POA: Diagnosis not present

## 2017-10-19 DIAGNOSIS — D509 Iron deficiency anemia, unspecified: Secondary | ICD-10-CM | POA: Insufficient documentation

## 2017-10-19 LAB — CBC WITH DIFFERENTIAL (CANCER CENTER ONLY)
Basophils Absolute: 0 10*3/uL (ref 0.0–0.1)
Basophils Relative: 1 %
Eosinophils Absolute: 0.1 10*3/uL (ref 0.0–0.5)
Eosinophils Relative: 1 %
HCT: 29.7 % — ABNORMAL LOW (ref 34.8–46.6)
Hemoglobin: 9.7 g/dL — ABNORMAL LOW (ref 11.6–15.9)
Lymphocytes Relative: 34 %
Lymphs Abs: 1.4 10*3/uL (ref 0.9–3.3)
MCH: 25.4 pg — ABNORMAL LOW (ref 26.0–34.0)
MCHC: 32.7 g/dL (ref 32.0–36.0)
MCV: 77.7 fL — ABNORMAL LOW (ref 81.0–101.0)
Monocytes Absolute: 0.4 10*3/uL (ref 0.1–0.9)
Monocytes Relative: 8 %
Neutro Abs: 2.4 10*3/uL (ref 1.5–6.5)
Neutrophils Relative %: 56 %
Platelet Count: 295 10*3/uL (ref 145–400)
RBC: 3.82 MIL/uL (ref 3.70–5.32)
RDW: 14.2 % (ref 11.1–15.7)
WBC Count: 4.2 10*3/uL (ref 3.9–10.0)

## 2017-10-19 LAB — RETICULOCYTES
RBC.: 3.85 MIL/uL (ref 3.70–5.45)
Retic Count, Absolute: 50.1 10*3/uL (ref 33.7–90.7)
Retic Ct Pct: 1.3 % (ref 0.7–2.1)

## 2017-10-20 LAB — IRON AND TIBC
Iron: 34 ug/dL — ABNORMAL LOW (ref 41–142)
Saturation Ratios: 9 % — ABNORMAL LOW (ref 21–57)
TIBC: 378 ug/dL (ref 236–444)
UIBC: 343 ug/dL

## 2017-10-20 LAB — FERRITIN: Ferritin: 8 ng/mL — ABNORMAL LOW (ref 9–269)

## 2017-10-25 ENCOUNTER — Inpatient Hospital Stay: Payer: Managed Care, Other (non HMO)

## 2017-10-25 ENCOUNTER — Inpatient Hospital Stay (HOSPITAL_BASED_OUTPATIENT_CLINIC_OR_DEPARTMENT_OTHER): Payer: Managed Care, Other (non HMO) | Admitting: Family

## 2017-10-25 VITALS — BP 114/66 | HR 81 | Resp 18

## 2017-10-25 VITALS — BP 127/81 | HR 84 | Temp 97.9°F | Resp 18 | Wt 238.0 lb

## 2017-10-25 DIAGNOSIS — K909 Intestinal malabsorption, unspecified: Secondary | ICD-10-CM

## 2017-10-25 DIAGNOSIS — Z79899 Other long term (current) drug therapy: Secondary | ICD-10-CM

## 2017-10-25 DIAGNOSIS — Z9884 Bariatric surgery status: Secondary | ICD-10-CM

## 2017-10-25 DIAGNOSIS — D508 Other iron deficiency anemias: Secondary | ICD-10-CM

## 2017-10-25 DIAGNOSIS — D509 Iron deficiency anemia, unspecified: Secondary | ICD-10-CM

## 2017-10-25 MED ORDER — FERUMOXYTOL INJECTION 510 MG/17 ML
510.0000 mg | Freq: Once | INTRAVENOUS | Status: AC
  Administered 2017-10-25: 510 mg via INTRAVENOUS
  Filled 2017-10-25: qty 17

## 2017-10-25 MED ORDER — SODIUM CHLORIDE 0.9 % IV SOLN
Freq: Once | INTRAVENOUS | Status: AC
  Administered 2017-10-25: 15:00:00 via INTRAVENOUS

## 2017-10-25 NOTE — Progress Notes (Signed)
Hematology and Oncology Follow Up Visit  Brandi Lutz [id number] June 16, 1975 42 y.o. 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 [name] symptomatic with fatigued, chewing ice and headaches.  Labs were done last week with Hgb 9.7, iron saturation 9% and ferritin was 8.  Her cycle is regular and not heavy. She has had no other bleeding. No bruising or petechiae.  No lymphadenopathy noted on exam.  No fever, chills, n/v, cough, rash, dizziness, SOB, chest pain, palpitations, abdominal pain or changes in bowel or bladder habits.  She has some occasional puffiness in her right ankle due to an old injury. No other swelling in her extremities. No tenderness, numbness or tingling in her extremities.  She has maintained a good appetite and is staying well hydrated. Her weight is stable.   ECOG Performance Status: 1 - Symptomatic but completely ambulatory  Medications:  Allergies as of 10/25/2017      Reactions   Tape Rash      Medication List        Accurate as of 10/25/17  2:45 PM. Always use your most recent med list.          acetaminophen 500 MG tablet Commonly known as:  TYLENOL Take 1,000 mg by mouth every 8 (eight) hours as needed for mild pain or moderate pain.   buPROPion 150 MG 24 hr tablet Commonly known as:  WELLBUTRIN XL Take 150 mg by mouth daily.   IRON PO Take by mouth.   multivitamin with minerals tablet Take 1 tablet by mouth daily. Women's ultra Multi-vitamin   sertraline 50 MG tablet Commonly known as:  ZOLOFT Take 50 mg by mouth daily.       Allergies:  Allergies  Allergen Reactions  . Tape Rash    Past Medical History, Surgical history, Social history, and Family History were  reviewed and updated.  Review of Systems: All other 10 point review of systems is negative.   Physical Exam:  vitals were not taken for this visit.   Wt Readings from Last 3 Encounters:  09/21/17 232 lb 5 oz (105.4 kg)  06/20/17 230 lb (104.3 kg)  09/29/16 228 lb (103.4 kg)    Ocular: Sclerae unicteric, pupils equal, round and reactive to light Ear-nose-throat: Oropharynx clear, dentition fair Lymphatic: No cervical, supraclavicular or axillary adenopathy Lungs no rales or rhonchi, good excursion bilaterally Heart regular rate and rhythm, no murmur appreciated Abd soft, nontender, positive bowel sounds, no liver or spleen tip palpated on exam, no fluid wave  MSK no focal spinal tenderness, no joint edema Neuro: non-focal, well-oriented, appropriate affect Breasts: Deferred   Lab Results  Component Value Date   WBC 4.2 10/19/2017   HGB 9.7 (L) 10/19/2017   HCT 29.7 (L) 10/19/2017   MCV 77.7 (L) 10/19/2017   PLT 295 10/19/2017   Lab Results  Component Value Date   FERRITIN 8 (L) 10/19/2017   IRON 34 (L) 10/19/2017   TIBC 378 10/19/2017   UIBC 343 10/19/2017   IRONPCTSAT 9 (L) 10/19/2017   Lab Results  Component Value Date   RETICCTPCT 1.3 10/19/2017   RBC 3.82 10/19/2017   RBC 3.85 10/19/2017   RETICCTABS 31.3 05/02/2015   No results found for: KPAFRELGTCHN, LAMBDASER, KAPLAMBRATIO  No results found for: IGGSERUM, IGA, IGMSERUM No results found for: Odetta Pink, SPEI   Chemistry      Component Value Date/Time   NA 140 08/06/2014 0637   K 3.5 08/06/2014 0637   CL 108 08/06/2014 0637   CO2 30 08/06/2014 0637   BUN 7 08/06/2014 0637   CREATININE 0.79 08/06/2014 0637      Component Value Date/Time   CALCIUM 8.1 (L) 08/06/2014 0637   ALKPHOS 77 08/05/2014 1031   AST 28 08/05/2014 1031   ALT 25 08/05/2014 1031   BILITOT 0.6 08/05/2014 1031      Impression and Plan: Brandi Lutz is a very pleasant 42 yo  African American female with iron deficiency anemia secondary to gastric bypass and malabsorption. [name] symptomatic with fatigue, chewing ice and headaches.  Her iron saturation was 9% and ferritin 8.  We will give her Iv iron today and a second dose next week.  We will see her back in another 6 weeks for follow-up.  She will contact our office with any questions or concerns. We can certainly see her sooner if need be.   Laverna Peace, NP 5/14/20192:45 PM

## 2017-10-25 NOTE — Patient Instructions (Signed)

## 2017-11-01 ENCOUNTER — Inpatient Hospital Stay: Payer: Managed Care, Other (non HMO)

## 2017-11-01 VITALS — BP 123/70 | HR 85 | Temp 98.1°F | Resp 20

## 2017-11-01 DIAGNOSIS — Z9884 Bariatric surgery status: Secondary | ICD-10-CM

## 2017-11-01 DIAGNOSIS — D509 Iron deficiency anemia, unspecified: Secondary | ICD-10-CM | POA: Diagnosis not present

## 2017-11-01 DIAGNOSIS — D508 Other iron deficiency anemias: Secondary | ICD-10-CM

## 2017-11-01 DIAGNOSIS — K909 Intestinal malabsorption, unspecified: Secondary | ICD-10-CM

## 2017-11-01 MED ORDER — SODIUM CHLORIDE 0.9 % IV SOLN
510.0000 mg | Freq: Once | INTRAVENOUS | Status: AC
  Administered 2017-11-01: 510 mg via INTRAVENOUS
  Filled 2017-11-01: qty 17

## 2017-11-01 MED ORDER — SODIUM CHLORIDE 0.9 % IV SOLN
Freq: Once | INTRAVENOUS | Status: AC
  Administered 2017-11-01: 15:00:00 via INTRAVENOUS

## 2017-11-01 NOTE — Patient Instructions (Signed)

## 2017-12-06 ENCOUNTER — Other Ambulatory Visit: Payer: Managed Care, Other (non HMO)

## 2017-12-06 ENCOUNTER — Ambulatory Visit: Payer: Managed Care, Other (non HMO) | Admitting: Family

## 2018-02-17 ENCOUNTER — Inpatient Hospital Stay: Payer: Managed Care, Other (non HMO)

## 2018-02-17 ENCOUNTER — Ambulatory Visit: Payer: Managed Care, Other (non HMO) | Admitting: Family

## 2018-02-24 ENCOUNTER — Inpatient Hospital Stay (HOSPITAL_BASED_OUTPATIENT_CLINIC_OR_DEPARTMENT_OTHER): Payer: Managed Care, Other (non HMO) | Admitting: Family

## 2018-02-24 ENCOUNTER — Inpatient Hospital Stay: Payer: Managed Care, Other (non HMO) | Attending: Hematology & Oncology

## 2018-02-24 ENCOUNTER — Encounter: Payer: Self-pay | Admitting: Family

## 2018-02-24 ENCOUNTER — Other Ambulatory Visit: Payer: Self-pay

## 2018-02-24 VITALS — BP 117/70 | HR 82 | Temp 98.5°F | Resp 19 | Wt 228.0 lb

## 2018-02-24 DIAGNOSIS — D508 Other iron deficiency anemias: Secondary | ICD-10-CM | POA: Diagnosis not present

## 2018-02-24 DIAGNOSIS — R6883 Chills (without fever): Secondary | ICD-10-CM | POA: Insufficient documentation

## 2018-02-24 DIAGNOSIS — Z9884 Bariatric surgery status: Secondary | ICD-10-CM | POA: Insufficient documentation

## 2018-02-24 DIAGNOSIS — R5383 Other fatigue: Secondary | ICD-10-CM | POA: Insufficient documentation

## 2018-02-24 DIAGNOSIS — D509 Iron deficiency anemia, unspecified: Secondary | ICD-10-CM | POA: Insufficient documentation

## 2018-02-24 DIAGNOSIS — K912 Postsurgical malabsorption, not elsewhere classified: Secondary | ICD-10-CM

## 2018-02-24 DIAGNOSIS — Z79899 Other long term (current) drug therapy: Secondary | ICD-10-CM

## 2018-02-24 DIAGNOSIS — K909 Intestinal malabsorption, unspecified: Secondary | ICD-10-CM

## 2018-02-24 LAB — CBC WITH DIFFERENTIAL (CANCER CENTER ONLY)
Basophils Absolute: 0 10*3/uL (ref 0.0–0.1)
Basophils Relative: 0 %
Eosinophils Absolute: 0.1 10*3/uL (ref 0.0–0.5)
Eosinophils Relative: 2 %
HCT: 39.4 % (ref 34.8–46.6)
Hemoglobin: 13.2 g/dL (ref 11.6–15.9)
Lymphocytes Relative: 34 %
Lymphs Abs: 1.6 10*3/uL (ref 0.9–3.3)
MCH: 27.1 pg (ref 26.0–34.0)
MCHC: 33.5 g/dL (ref 32.0–36.0)
MCV: 80.9 fL — ABNORMAL LOW (ref 81.0–101.0)
Monocytes Absolute: 0.5 10*3/uL (ref 0.1–0.9)
Monocytes Relative: 10 %
Neutro Abs: 2.6 10*3/uL (ref 1.5–6.5)
Neutrophils Relative %: 54 %
Platelet Count: 251 10*3/uL (ref 145–400)
RBC: 4.87 MIL/uL (ref 3.70–5.32)
RDW: 12.8 % (ref 11.1–15.7)
WBC Count: 4.9 10*3/uL (ref 3.9–10.0)

## 2018-02-24 LAB — RETICULOCYTES
RBC.: 4.87 MIL/uL (ref 3.87–5.11)
Retic Count, Absolute: 24.4 10*3/uL (ref 19.0–186.0)
Retic Ct Pct: 0.5 % (ref 0.4–3.1)

## 2018-02-24 NOTE — Progress Notes (Signed)
Hematology and Oncology Follow Up Visit  Brandi Lutz [id number] 08/05/1975 42 y.o. 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 She has been symptomatic with fatigue and chills.  Her cycle is regular and normal flow. No other blood loss. No bruising or petechia.  Hgb is improved at 13.2 with an MCV of 80.  No fever, n/v, cough, rash, dizziness, SOB, chest pain, palpitations, abdominal pain or changes in bowel or bladder habits.  No swelling, tenderness, numbness or tingling in her extremities. No c/o pain.  No lymphadenopathy noted on exam.  She has maintained a good appetite and is staying well hydrated. Her weight is stable.   ECOG Performance Status: 1 - Symptomatic but completely ambulatory  Medications:  Allergies as of 02/24/2018      Reactions   Tape Rash      Medication List        Accurate as of 02/24/18  3:05 PM. Always use your most recent med list.          acetaminophen 500 MG tablet Commonly known as:  TYLENOL Take 1,000 mg by mouth every 8 (eight) hours as needed for mild pain or moderate pain.   buPROPion 150 MG 24 hr tablet Commonly known as:  WELLBUTRIN XL Take 150 mg by mouth daily.   IRON PO Take by mouth.   multivitamin with minerals tablet Take 1 tablet by mouth daily. Women's ultra Multi-vitamin   sertraline 50 MG tablet Commonly known as:  ZOLOFT Take 50 mg by mouth daily.       Allergies:  Allergies  Allergen Reactions  . Tape Rash    Past Medical History, Surgical history, Social history, and Family History were reviewed and updated.  Review of Systems: All other 10 point review of systems is negative.   Physical Exam:  vitals were not taken for this visit.    Wt Readings from Last 3 Encounters:  10/25/17 238 lb (108 kg)  09/21/17 232 lb 5 oz (105.4 kg)  06/20/17 230 lb (104.3 kg)    Ocular: Sclerae unicteric, pupils equal, round and reactive to light Ear-nose-throat: Oropharynx clear, dentition fair Lymphatic: No cervical, supraclavicular or axillary adenopathy Lungs no rales or rhonchi, good excursion bilaterally Heart regular rate and rhythm, no murmur appreciated Abd soft, nontender, positive bowel sounds, no liver spleen tip palpated on exam, no fluid wave  MSK no focal spinal tenderness, no joint edema Neuro: non-focal, well-oriented, appropriate affect Breasts: Deferred  Lab Results  Component Value Date   WBC 4.9 02/24/2018   HGB 13.2 02/24/2018   HCT 39.4 02/24/2018   MCV 80.9 (L) 02/24/2018   PLT 251 02/24/2018   Lab Results  Component Value Date   FERRITIN 8 (L) 10/19/2017   IRON 34 (L) 10/19/2017   TIBC 378 10/19/2017   UIBC 343 10/19/2017   IRONPCTSAT 9 (L) 10/19/2017   Lab Results  Component Value Date   RETICCTPCT 1.3 10/19/2017   RBC 4.87 02/24/2018   RETICCTABS 31.3 05/02/2015   No results found for: KPAFRELGTCHN, LAMBDASER, KAPLAMBRATIO No results found for: IGGSERUM, IGA, IGMSERUM No results found for: TOTALPROTELP, ALBUMINELP, A1GS, A2GS, BETS, BETA2SER, GAMS, MSPIKE, SPEI   Chemistry      Component Value Date/Time  NA 140 08/06/2014 0637   K 3.5 08/06/2014 0637   CL 108 08/06/2014 0637   CO2 30 08/06/2014 0637   BUN 7 08/06/2014 0637   CREATININE 0.79 08/06/2014 0637      Component Value Date/Time   CALCIUM 8.1 (L) 08/06/2014 0637   ALKPHOS 77 08/05/2014 1031   AST 28 08/05/2014 1031   ALT 25 08/05/2014 1031   BILITOT 0.6 08/05/2014 1031      Impression and Plan: Brandi Lutz is a very pleasant 42 yo African American female with iron deficiency anemia secondary to malabsorption after gastric bypass. She has been having fatigue and chills.  We will see what her iron studies show and bring  her back in for infusion if needed.  We will go ahead and plan to see her back in another 4 months for follow-up.  She will contact our office with any questions or concerns. We can certainly see her sooner if need be.   Laverna Peace, NP 9/13/20193:05 PM

## 2018-02-27 LAB — IRON AND TIBC
Iron: 81 ug/dL (ref 41–142)
Saturation Ratios: 24 % (ref 21–57)
TIBC: 334 ug/dL (ref 236–444)
UIBC: 253 ug/dL

## 2018-02-27 LAB — FERRITIN: Ferritin: 30 ng/mL (ref 11–307)

## 2018-06-26 ENCOUNTER — Ambulatory Visit: Payer: Managed Care, Other (non HMO) | Admitting: Family

## 2018-06-26 ENCOUNTER — Other Ambulatory Visit: Payer: Managed Care, Other (non HMO)

## 2018-07-20 ENCOUNTER — Ambulatory Visit (INDEPENDENT_AMBULATORY_CARE_PROVIDER_SITE_OTHER): Payer: Managed Care, Other (non HMO) | Admitting: Specialist

## 2018-08-18 ENCOUNTER — Ambulatory Visit (INDEPENDENT_AMBULATORY_CARE_PROVIDER_SITE_OTHER): Payer: Managed Care, Other (non HMO) | Admitting: Specialist

## 2018-10-24 ENCOUNTER — Other Ambulatory Visit: Payer: Self-pay

## 2018-10-24 ENCOUNTER — Inpatient Hospital Stay (HOSPITAL_BASED_OUTPATIENT_CLINIC_OR_DEPARTMENT_OTHER): Payer: Managed Care, Other (non HMO) | Admitting: Family

## 2018-10-24 ENCOUNTER — Inpatient Hospital Stay: Payer: Managed Care, Other (non HMO) | Attending: Family

## 2018-10-24 VITALS — BP 121/57 | HR 88 | Resp 18 | Ht 68.0 in | Wt 228.0 lb

## 2018-10-24 DIAGNOSIS — Z9884 Bariatric surgery status: Secondary | ICD-10-CM | POA: Insufficient documentation

## 2018-10-24 DIAGNOSIS — K912 Postsurgical malabsorption, not elsewhere classified: Secondary | ICD-10-CM | POA: Diagnosis not present

## 2018-10-24 DIAGNOSIS — D508 Other iron deficiency anemias: Secondary | ICD-10-CM

## 2018-10-24 DIAGNOSIS — K909 Intestinal malabsorption, unspecified: Secondary | ICD-10-CM

## 2018-10-24 DIAGNOSIS — D510 Vitamin B12 deficiency anemia due to intrinsic factor deficiency: Secondary | ICD-10-CM

## 2018-10-24 DIAGNOSIS — D509 Iron deficiency anemia, unspecified: Secondary | ICD-10-CM

## 2018-10-24 DIAGNOSIS — Z79899 Other long term (current) drug therapy: Secondary | ICD-10-CM | POA: Diagnosis not present

## 2018-10-24 LAB — CBC WITH DIFFERENTIAL (CANCER CENTER ONLY)
Abs Immature Granulocytes: 0.01 10*3/uL (ref 0.00–0.07)
Basophils Absolute: 0 10*3/uL (ref 0.0–0.1)
Basophils Relative: 1 %
Eosinophils Absolute: 0.1 10*3/uL (ref 0.0–0.5)
Eosinophils Relative: 1 %
HCT: 34 % — ABNORMAL LOW (ref 36.0–46.0)
Hemoglobin: 10.7 g/dL — ABNORMAL LOW (ref 12.0–15.0)
Immature Granulocytes: 0 %
Lymphocytes Relative: 32 %
Lymphs Abs: 1.6 10*3/uL (ref 0.7–4.0)
MCH: 24.5 pg — ABNORMAL LOW (ref 26.0–34.0)
MCHC: 31.5 g/dL (ref 30.0–36.0)
MCV: 77.8 fL — ABNORMAL LOW (ref 80.0–100.0)
Monocytes Absolute: 0.4 10*3/uL (ref 0.1–1.0)
Monocytes Relative: 8 %
Neutro Abs: 2.8 10*3/uL (ref 1.7–7.7)
Neutrophils Relative %: 58 %
Platelet Count: 354 10*3/uL (ref 150–400)
RBC: 4.37 MIL/uL (ref 3.87–5.11)
RDW: 14.2 % (ref 11.5–15.5)
WBC Count: 4.9 10*3/uL (ref 4.0–10.5)
nRBC: 0 % (ref 0.0–0.2)

## 2018-10-24 LAB — RETICULOCYTES
Immature Retic Fract: 11.3 % (ref 2.3–15.9)
RBC.: 4.36 MIL/uL (ref 3.87–5.11)
Retic Count, Absolute: 39.7 10*3/uL (ref 19.0–186.0)
Retic Ct Pct: 0.9 % (ref 0.4–3.1)

## 2018-10-24 NOTE — Progress Notes (Signed)
Hematology and Oncology Follow Up Visit  Brandi Lutz [id number] 1976-05-07 43 y.o. 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 [name] symptomatic with fatigue, weakness, chancre sore in her mouth (resolved) craving ice and mints.  Her cycle is regular and not heavy. No other bleeding, no bruising or petechiae.  No fever, chills, n/v, cough, rash, dizziness, SOB, chest pain, palpitations, abdominal pain or changes in bowel or bladder habits.  No swelling, tenderness, numbness or tingling in her extremities.  No lymphadenopathy noted on exam.  She has maintained a good appetite and is staying well hydrated. Her weight is stable.   ECOG Performance Status: 1 - Symptomatic but completely ambulatory  Medications:  Allergies as of 10/24/2018      Reactions   Tape Rash      Medication List       Accurate as of Oct 24, 2018  2:00 PM. If you have any questions, ask your nurse or doctor.        acetaminophen 500 MG tablet Commonly known as:  TYLENOL Take 1,000 mg by mouth every 8 (eight) hours as needed for mild pain or moderate pain.   buPROPion 150 MG 24 hr tablet Commonly known as:  WELLBUTRIN XL Take 150 mg by mouth daily.   IRON PO Take by mouth.   multivitamin with minerals tablet Take 1 tablet by mouth daily. Women's ultra Multi-vitamin   sertraline 50 MG tablet Commonly known as:  ZOLOFT Take 50 mg by mouth daily.       Allergies:  Allergies  Allergen Reactions  . Tape Rash    Past Medical History, Surgical history, Social history, and Family History were reviewed and updated.  Review of Systems: All other 10 point review of systems is negative.   Physical Exam:  vitals were not taken for this visit.   Wt Readings  from Last 3 Encounters:  02/24/18 228 lb (103.4 kg)  10/25/17 238 lb (108 kg)  09/21/17 232 lb 5 oz (105.4 kg)    Ocular: Sclerae unicteric, pupils equal, round and reactive to light Ear-nose-throat: Oropharynx clear, dentition fair Lymphatic: No cervical or supraclavicular adenopathy Lungs no rales or rhonchi, good excursion bilaterally Heart regular rate and rhythm, no murmur appreciated Abd soft, nontender, positive bowel sounds, no liver or spleen tip palpated on exam, no fluid wave  MSK no focal spinal tenderness, no joint edema Neuro: non-focal, well-oriented, appropriate affect Breasts: Deferred   Lab Results  Component Value Date   WBC 4.9 10/24/2018   HGB 10.7 (L) 10/24/2018   HCT 34.0 (L) 10/24/2018   MCV 77.8 (L) 10/24/2018   PLT 354 10/24/2018   Lab Results  Component Value Date   FERRITIN 30 02/24/2018   IRON 81 02/24/2018   TIBC 334 02/24/2018   UIBC 253 02/24/2018   IRONPCTSAT 24 02/24/2018   Lab Results  Component Value Date   RETICCTPCT 0.9 10/24/2018   RBC 4.36 10/24/2018   RETICCTABS 31.3 05/02/2015   No results found for: KPAFRELGTCHN, LAMBDASER, KAPLAMBRATIO No results found for: IGGSERUM, IGA, IGMSERUM No results found for: Ronnald Ramp, A1GS, A2GS, BETS, BETA2SER, GAMS, MSPIKE, SPEI   Chemistry      Component Value Date/Time   NA 140 08/06/2014 0637   K 3.5 08/06/2014  0637   CL 108 08/06/2014 0637   CO2 30 08/06/2014 0637   BUN 7 08/06/2014 0637   CREATININE 0.79 08/06/2014 0637      Component Value Date/Time   CALCIUM 8.1 (L) 08/06/2014 0637   ALKPHOS 77 08/05/2014 1031   AST 28 08/05/2014 1031   ALT 25 08/05/2014 1031   BILITOT 0.6 08/05/2014 1031       Impression and Plan: Brandi Lutz is a very pleasant 43 yo African American female with iron deficiency anemia secondary to malabsorption after gastric bypass.  We will see what her iron studies show and bring her back in for infusion if needed.  We will go ahead and plan  to see her back in another 2 months.  She will contact our office with any questions or concerns. We can certainly see her sooner if need be.   Laverna Peace, NP 5/12/20202:00 PM

## 2018-10-25 LAB — IRON AND TIBC
Iron: 50 ug/dL (ref 41–142)
Saturation Ratios: 12 % — ABNORMAL LOW (ref 21–57)
TIBC: 405 ug/dL (ref 236–444)
UIBC: 355 ug/dL (ref 120–384)

## 2018-10-25 LAB — FERRITIN: Ferritin: 5 ng/mL — ABNORMAL LOW (ref 11–307)

## 2018-10-27 ENCOUNTER — Telehealth: Payer: Self-pay | Admitting: Hematology & Oncology

## 2018-10-27 NOTE — Telephone Encounter (Signed)
Unable to reach patient due to no VM being set up. Letter/calendar mailed per 5/13 sch msg

## 2018-11-01 ENCOUNTER — Ambulatory Visit: Payer: Managed Care, Other (non HMO)

## 2018-11-01 ENCOUNTER — Inpatient Hospital Stay: Payer: Managed Care, Other (non HMO)

## 2018-11-01 ENCOUNTER — Other Ambulatory Visit: Payer: Self-pay

## 2018-11-01 ENCOUNTER — Other Ambulatory Visit: Payer: Self-pay | Admitting: Family

## 2018-11-01 VITALS — BP 121/69 | HR 87 | Ht 68.0 in | Wt 228.0 lb

## 2018-11-01 DIAGNOSIS — K909 Intestinal malabsorption, unspecified: Secondary | ICD-10-CM

## 2018-11-01 DIAGNOSIS — D509 Iron deficiency anemia, unspecified: Secondary | ICD-10-CM | POA: Diagnosis not present

## 2018-11-01 DIAGNOSIS — D508 Other iron deficiency anemias: Secondary | ICD-10-CM

## 2018-11-01 DIAGNOSIS — Z9884 Bariatric surgery status: Secondary | ICD-10-CM

## 2018-11-01 MED ORDER — SODIUM CHLORIDE 0.9 % IV SOLN
510.0000 mg | Freq: Once | INTRAVENOUS | Status: AC
  Administered 2018-11-01: 510 mg via INTRAVENOUS
  Filled 2018-11-01: qty 510

## 2018-11-01 MED ORDER — SODIUM CHLORIDE 0.9 % IV SOLN
Freq: Once | INTRAVENOUS | Status: AC
  Administered 2018-11-01: 13:00:00 via INTRAVENOUS
  Filled 2018-11-01: qty 250

## 2018-11-01 NOTE — Patient Instructions (Signed)
Iron Deficiency Anemia, Adult  Iron-deficiency anemia is when you have a low amount of red blood cells or hemoglobin. This happens because you have too little iron in your body. Hemoglobin carries oxygen to parts of the body. Anemia can cause your body to not get enough oxygen. It may or may not cause symptoms.  Follow these instructions at home:  Medicines  · Take over-the-counter and prescription medicines only as told by your doctor. This includes iron pills (supplements) and vitamins.  · If you cannot handle taking iron pills by mouth, ask your doctor about getting iron through:  ? A vein (intravenously).  ? A shot (injection) into a muscle.  · Take iron pills when your stomach is empty. If you cannot handle this, take them with food.  · Do not drink milk or take antacids at the same time as your iron pills.  · To prevent trouble pooping (constipation), eat fiber or take medicine (stool softener) as told by your doctor.  Eating and drinking    · Talk with your doctor before changing the foods you eat. He or she may tell you to eat foods that have a lot of iron, such as:  ? Liver.  ? Lowfat (lean) beef.  ? Breads and cereals that have iron added to them (fortified breads and cereals).  ? Eggs.  ? Dried fruit.  ? Dark green, leafy vegetables.  · Drink enough fluid to keep your pee (urine) clear or pale yellow.  · Eat fresh fruits and vegetables that are high in vitamin C. They help your body to use iron. Foods with a lot of vitamin C include:  ? Oranges.  ? Peppers.  ? Tomatoes.  ? Mangoes.  General instructions  · Return to your normal activities as told by your doctor. Ask your doctor what activities are safe for you.  · Keep yourself clean, and keep things clean around you (your surroundings). Anemia can make you get sick more easily.  · Keep all follow-up visits as told by your doctor. This is important.  Contact a doctor if:  · You feel sick to your stomach (nauseous).  · You throw up (vomit).  · You feel  weak.  · You are sweating for no clear reason.  · You have trouble pooping, such as:  ? Pooping (having a bowel movement) less than 3 times a week.  ? Straining to poop.  ? Having poop that is hard, dry, or larger than normal.  ? Feeling full or bloated.  ? Pain in the lower belly.  ? Not feeling better after pooping.  Get help right away if:  · You pass out (faint). If this happens, do not drive yourself to the hospital. Call your local emergency services (911 in the [place]  · You have chest pain.  · You have shortness of breath that:  ? Is very bad.  ? Gets worse with physical activity.  · You have a fast heartbeat.  · You get light-headed when getting up from sitting or lying down.  This information is not intended to replace advice given to you by your health care provider. Make sure you discuss any questions you have with your health care provider.  Document Released: 07/03/2010 Document Revised: 02/18/2016 Document Reviewed: 02/18/2016  Elsevier Interactive Patient Education © 2019 [employer] Inc[employer]

## 2018-11-02 ENCOUNTER — Inpatient Hospital Stay: Payer: Managed Care, Other (non HMO)

## 2018-11-08 ENCOUNTER — Inpatient Hospital Stay: Payer: Managed Care, Other (non HMO)

## 2018-11-08 ENCOUNTER — Other Ambulatory Visit: Payer: Self-pay

## 2018-11-08 VITALS — BP 124/85 | HR 77 | Temp 98.4°F | Resp 18

## 2018-11-08 DIAGNOSIS — D508 Other iron deficiency anemias: Secondary | ICD-10-CM

## 2018-11-08 DIAGNOSIS — D509 Iron deficiency anemia, unspecified: Secondary | ICD-10-CM | POA: Diagnosis not present

## 2018-11-08 DIAGNOSIS — K909 Intestinal malabsorption, unspecified: Secondary | ICD-10-CM

## 2018-11-08 DIAGNOSIS — Z9884 Bariatric surgery status: Secondary | ICD-10-CM

## 2018-11-08 MED ORDER — SODIUM CHLORIDE 0.9 % IV SOLN
Freq: Once | INTRAVENOUS | Status: DC
  Filled 2018-11-08: qty 250

## 2018-11-08 MED ORDER — SODIUM CHLORIDE 0.9 % IV SOLN
INTRAVENOUS | Status: DC
  Administered 2018-11-08: 14:00:00 via INTRAVENOUS
  Filled 2018-11-08: qty 250

## 2018-11-08 MED ORDER — SODIUM CHLORIDE 0.9 % IV SOLN
510.0000 mg | Freq: Once | INTRAVENOUS | Status: AC
  Administered 2018-11-08: 510 mg via INTRAVENOUS
  Filled 2018-11-08: qty 17

## 2018-11-09 ENCOUNTER — Ambulatory Visit: Payer: Managed Care, Other (non HMO) | Admitting: Orthopaedic Surgery

## 2018-11-22 ENCOUNTER — Ambulatory Visit (INDEPENDENT_AMBULATORY_CARE_PROVIDER_SITE_OTHER): Payer: Managed Care, Other (non HMO) | Admitting: Orthopaedic Surgery

## 2018-11-22 ENCOUNTER — Encounter: Payer: Self-pay | Admitting: Orthopaedic Surgery

## 2018-11-22 ENCOUNTER — Other Ambulatory Visit: Payer: Self-pay

## 2018-11-22 DIAGNOSIS — M25562 Pain in left knee: Secondary | ICD-10-CM

## 2018-11-22 DIAGNOSIS — G8929 Other chronic pain: Secondary | ICD-10-CM | POA: Diagnosis not present

## 2018-11-22 MED ORDER — METHYLPREDNISOLONE ACETATE 40 MG/ML IJ SUSP
40.0000 mg | INTRAMUSCULAR | Status: AC | PRN
  Administered 2018-11-22: 40 mg via INTRA_ARTICULAR

## 2018-11-22 MED ORDER — LIDOCAINE HCL 1 % IJ SOLN
2.0000 mL | INTRAMUSCULAR | Status: AC | PRN
  Administered 2018-11-22: 18:00:00 2 mL

## 2018-11-22 MED ORDER — BUPIVACAINE HCL 0.5 % IJ SOLN
2.0000 mL | INTRAMUSCULAR | Status: AC | PRN
  Administered 2018-11-22: 18:00:00 2 mL via INTRA_ARTICULAR

## 2018-11-22 NOTE — Progress Notes (Signed)
Office Visit Note   Patient: Brandi Lutz           Date of Birth: 1975/09/26           MRN: [id number] Visit Date: 11/22/2018              Requested by: Benito Mccreedy, MD [address] [place], [place] [id number] PCP: Benito Mccreedy, MD   Assessment & Plan: Visit Diagnoses:  1. Chronic pain of left knee     Plan: Impression is left knee patellofemoral arthritis.  35 cc blood was aspirated from the left knee.  We then injected the left knee with cortisone.  She will follow-up with [place] in a few weeks if she is not any better.  We have discussed viscosupplementation injection should cortisone fail to relieve her symptoms.  Call with concerns or questions in the meantime.  This patient is diagnosed with osteoarthritis of the knee(s).    Radiographs show evidence of joint space narrowing, osteophytes, subchondral sclerosis and/or subchondral cysts.  This patient has knee pain which interferes with functional and activities of daily living.    This patient has experienced inadequate response, adverse effects and/or intolerance with conservative treatments such as acetaminophen, NSAIDS, topical creams, physical therapy or regular exercise, knee bracing and/or weight loss.   This patient has experienced inadequate response or has a contraindication to intra articular steroid injections for at least 3 months.   This patient is not scheduled to have a total knee replacement within 6 months of starting treatment with viscosupplementation.   Follow-Up Instructions: Return if symptoms worsen or fail to improve.   Orders:  No orders of the defined types were placed in this encounter.  No orders of the defined types were placed in this encounter.     Procedures: Large Joint Inj: L knee on 11/22/2018 6:10 PM Details: 22 G needle Medications: 2 mL bupivacaine 0.5 %; 2 mL lidocaine 1 %; 40 mg methylPREDNISolone acetate 40 MG/ML Aspirate: 35 mL bloody Outcome: tolerated  well, no immediate complications Patient was prepped and draped in the usual sterile fashion.       Clinical Data: No additional findings.   Subjective: Chief Complaint  Patient presents with  . Left Knee - Pain    HPI patient is a pleasant 43 year old female who presents to our clinic today with recurrent left knee pain.  History of remote ACL reconstruction as well as left knee medial compartment replacement several years ago.  Doing okay until a few weeks ago when she went to stand up from sitting at her desk.  No specific injury or change in activity, but she noticed that her knee felt tight and she had significant pain.  She notes that this is occurred one other time as well.  It takes a few days for her pain to improve.  When she does have the pain it is described as a constant throb with occasional sharp shooting pains.  This is primarily to the posterior lateral aspect of the knee.  Majority of the pain occurs when she is putting pressure to the left foot.  Review of Systems as detailed in HPI.  All others reviewed and are negative.   Objective: Vital Signs: There were no vitals taken for this visit.  Physical Exam well-developed well-nourished female no acute distress.  Alert and oriented x3.  Ortho Exam examination of her left knee reveals a small effusion.  Range of motion 0 to 110 degrees.  Lateral  joint line tenderness.  She does have slight tenderness and fullness in the popliteal fossa.  Cruciates and collaterals are stable.  [name] neurovascularly intact distally.  Specialty Comments:  No specialty comments available.  Imaging: Imaging reviewed by me on the desk brought in by the patient shows a well-seated prosthesis.  She does have marked degenerative changes to the patellofemoral compartment.  No other acute findings.   PMFS History: Patient Active Problem List   Diagnosis Date Noted  . Chronic pain of left knee 11/22/2018  . IDA (iron deficiency anemia)  03/18/2016  . 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 Localized osteoarthritis of left knee 08/05/2014  . Status post left partial knee replacement 08/05/2014  . Other iron deficiency anemias 02/05/2013  . Gastric bypass status for obesity 02/05/2013   Past Medical History:  Diagnosis Date  . Anxiety   . Arthritis   . Cancer ([place])    colon  . Depression    takes Wellbutrin and Zoloft daily  . Gastric bypass status for obesity 02/05/2013  . History of colon polyps    benign  . History of migraine    none since high school  . Joint pain   . Joint swelling   . Other specified iron deficiency anemias 02/05/2013   2 iron infusions since gastric bypass;no abnormal reaction noted  . PONV (postoperative nausea and vomiting)   . S/P left unicompartmental knee replacement 08/05/2014    History reviewed. No pertinent family history.  Past Surgical History:  Procedure Laterality Date  . ARTHROSCOPIC REPAIR ACL Left 20 yrs ago  . CHOLECYSTECTOMY    . COLECTOMY    . COLONOSCOPY    . GASTRIC BYPASS    . herniated bowel repair    . KNEE ARTHROSCOPY Left   . PARTIAL KNEE ARTHROPLASTY Left 08/05/2014   Procedure: LEFT UNICOMPARTMENTAL KNEE;  Surgeon: Marianna Payment, MD;  Location: [facility];  Service: Orthopedics;  Laterality: Left;   Social History   Occupational History  . Not on file  Tobacco Use  . Smoking status: Never Smoker  . Smokeless tobacco: Never Used  Substance and Sexual Activity  . Alcohol use: Yes    Alcohol/week: 0.0 standard drinks    Comment: rarely  . Drug use: No  . Sexual activity: Not Currently

## 2018-11-29 ENCOUNTER — Telehealth: Payer: Self-pay

## 2018-11-29 NOTE — Telephone Encounter (Signed)
Submitted VOB for SynviscOne, left knee. 

## 2018-11-30 ENCOUNTER — Telehealth: Payer: Self-pay

## 2018-11-30 NOTE — Telephone Encounter (Signed)
PA required for SynviscOne, left knee. Faxed completed PA form to [place] at [phone].

## 2018-12-06 ENCOUNTER — Telehealth: Payer: Self-pay | Admitting: Family

## 2018-12-06 ENCOUNTER — Telehealth: Payer: Self-pay | Admitting: Orthopaedic Surgery

## 2018-12-06 NOTE — Telephone Encounter (Signed)
April,  Patient called and stated that her injection Josem Kaufmann has been authorized a while back and ends tomorrow. No one has called her to schedule injection.  The insurance co needs to be called to have auth extended.  Please contact patient to advise what is going on.  [phone]   [name] requesting call today because she wants no -financial responsibility for that injection.

## 2018-12-06 NOTE — Telephone Encounter (Signed)
Called and LMVM for patient to call back to confirm date/time of appointments that were rescheduled from 8/12 due to provider pal/

## 2018-12-07 ENCOUNTER — Telehealth: Payer: Self-pay | Admitting: Orthopaedic Surgery

## 2018-12-07 NOTE — Telephone Encounter (Signed)
Patient called needing a call back concerning the auth for gel injection. Please see previous note from patient. Patient said the Josem Kaufmann will need to be extended. The number to contact patient is [phone]

## 2018-12-08 NOTE — Telephone Encounter (Signed)
IC patient and discussed, she received a letter saying gel inj was approved and auth dates were 11/30/18 to 12/07/18.  I called [employer] and per [facility] system, approval # [id number], valid 11/30/18 thru 12/21/18.  I made appt for her for this coming Tuesday with Dr Erlinda Hong for injection Synvisc One, buy and bill.  Left knee.

## 2018-12-12 ENCOUNTER — Other Ambulatory Visit: Payer: Self-pay

## 2018-12-12 ENCOUNTER — Ambulatory Visit (INDEPENDENT_AMBULATORY_CARE_PROVIDER_SITE_OTHER): Payer: Managed Care, Other (non HMO) | Admitting: Orthopaedic Surgery

## 2018-12-12 DIAGNOSIS — M1712 Unilateral primary osteoarthritis, left knee: Secondary | ICD-10-CM | POA: Diagnosis not present

## 2018-12-12 MED ORDER — HYLAN G-F 20 48 MG/6ML IX SOSY
48.0000 mg | PREFILLED_SYRINGE | INTRA_ARTICULAR | Status: AC | PRN
  Administered 2018-12-12: 48 mg via INTRA_ARTICULAR

## 2018-12-12 NOTE — Progress Notes (Signed)
   Procedure Note  Patient: Brandi Lutz             Date of Birth: Nov 30, 1975           MRN: [id number]             Visit Date: 12/12/2018  Procedures: Visit Diagnoses:  1. Primary osteoarthritis of left knee     Large Joint Inj: L knee on 12/12/2018 8:46 AM Indications: pain Details: 22 G needle  Arthrogram: No  Medications: 48 mg Hylan 48 MG/6ML Outcome: tolerated well, no immediate complications Patient was prepped and draped in the usual sterile fashion.

## 2019-01-02 ENCOUNTER — Encounter: Payer: Self-pay | Admitting: Orthopaedic Surgery

## 2019-01-02 ENCOUNTER — Ambulatory Visit (INDEPENDENT_AMBULATORY_CARE_PROVIDER_SITE_OTHER): Payer: Managed Care, Other (non HMO) | Admitting: Orthopaedic Surgery

## 2019-01-02 ENCOUNTER — Ambulatory Visit (INDEPENDENT_AMBULATORY_CARE_PROVIDER_SITE_OTHER): Payer: Managed Care, Other (non HMO)

## 2019-01-02 ENCOUNTER — Other Ambulatory Visit: Payer: Self-pay

## 2019-01-02 VITALS — Ht 68.0 in | Wt 228.0 lb

## 2019-01-02 DIAGNOSIS — Z96652 Presence of left artificial knee joint: Secondary | ICD-10-CM

## 2019-01-02 DIAGNOSIS — M1712 Unilateral primary osteoarthritis, left knee: Secondary | ICD-10-CM | POA: Diagnosis not present

## 2019-01-02 NOTE — Progress Notes (Signed)
Office Visit Note   Patient: Brandi Lutz           Date of Birth: Mar 21, 1976           MRN: [id number] Visit Date: 01/02/2019              Requested by: Benito Mccreedy, MD [address] [place],  [place] [address] PCP: Benito Mccreedy, MD   Assessment & Plan: Visit Diagnoses:  1. Primary osteoarthritis of left knee   2. Status post left partial knee replacement     Plan: Unfortunately Brandi Lutz has not gotten much relief from conservative treatments in terms of cortisone injections, Visco supplementation, bracing, walking aids, physical therapy.  She now has fear of falling almost on a daily basis due to her knee buckling.  Her degenerative joint disease has progressed to her patellofemoral compartment.  At this point she would like to move forward with a total knee replacement.  Risks and benefits alternatives as well as recovery discussed with the patient today.  She will call us back when she is ready to schedule.  She has a history of gastric bypass surgery therefore we will place her on Xarelto postoperatively.  Follow-Up Instructions: Return if symptoms worsen or fail to improve.   Orders:  Orders Placed This Encounter  Procedures  . XR KNEE 3 VIEW LEFT   No orders of the defined types were placed in this encounter.     Procedures: No procedures performed   Clinical Data: No additional findings.   Subjective: Chief Complaint  Patient presents with  . Left Knee - Pain, Follow-up    Brandi Lutz is a 43 year old female who follows up today for continued left knee pain.  We previously injected Synvisc and she has not noticed any pain relief or improvement.  She still has significant difficulty especially when transitioning from sit to stand and from using stairs.  She feels like the knee wants to give out and buckle.  [name] currently working from home due to the COVID crisis.  She feels that she is ready to undergo conversion to a total knee  replacement as this is significantly affecting her quality of life and activity level.   Review of Systems  Constitutional: Negative.   HENT: Negative.   Eyes: Negative.   Respiratory: Negative.   Cardiovascular: Negative.   Endocrine: Negative.   Musculoskeletal: Negative.   Neurological: Negative.   Hematological: Negative.   Psychiatric/Behavioral: Negative.   All other systems reviewed and are negative.    Objective: Vital Signs: Ht 5\' 8"  (1.727 m)   Wt 228 lb (103.4 kg)   BMI 34.67 kg/m   Physical Exam Vitals signs and nursing note reviewed.  Constitutional:      Appearance: [name] well-developed.  Pulmonary:     Effort: Pulmonary effort is normal.  Skin:    General: Skin is warm.     Capillary Refill: Capillary refill takes less than 2 seconds.  Neurological:     Mental Status: [name] alert and oriented to person, place, and time.  Psychiatric:        Behavior: Behavior normal.        Thought Content: Thought content normal.        Judgment: Judgment normal.     Ortho Exam Left knee exam shows full range of motion a fully healed surgical scar.  Collaterals and cruciates are stable. Specialty Comments:  No specialty comments available.  Imaging: Xr Knee 3  View Left  Result Date: 01/02/2019 Stable medial compartment uni-compartment arthroplasty.  Degenerative changes of the patellofemoral compartment.  Lateral compartment appears to be well preserved.    PMFS History: Patient Active Problem List   Diagnosis Date Noted  . Primary osteoarthritis of left knee 01/02/2019  . Chronic pain of left knee 11/22/2018  . IDA (iron deficiency anemia) 03/18/2016  . 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 Localized osteoarthritis of left knee 08/05/2014  . Status post left partial knee replacement 08/05/2014  . Other iron deficiency anemias 02/05/2013  . Gastric bypass status for obesity 02/05/2013   Past Medical History:  Diagnosis Date  . Anxiety   . Arthritis    . Cancer ([place])    colon  . Depression    takes Wellbutrin and Zoloft daily  . Gastric bypass status for obesity 02/05/2013  . History of colon polyps    benign  . History of migraine    none since high school  . Joint pain   . Joint swelling   . Other specified iron deficiency anemias 02/05/2013   2 iron infusions since gastric bypass;no abnormal reaction noted  . PONV (postoperative nausea and vomiting)   . S/P left unicompartmental knee replacement 08/05/2014    No family history on file.  Past Surgical History:  Procedure Laterality Date  . ARTHROSCOPIC REPAIR ACL Left 20 yrs ago  . CHOLECYSTECTOMY    . COLECTOMY    . COLONOSCOPY    . GASTRIC BYPASS    . herniated bowel repair    . KNEE ARTHROSCOPY Left   . PARTIAL KNEE ARTHROPLASTY Left 08/05/2014   Procedure: LEFT UNICOMPARTMENTAL KNEE;  Surgeon: Marianna Payment, MD;  Location: [facility];  Service: Orthopedics;  Laterality: Left;   Social History   Occupational History  . Not on file  Tobacco Use  . Smoking status: Never Smoker  . Smokeless tobacco: Never Used  Substance and Sexual Activity  . Alcohol use: Yes    Alcohol/week: 0.0 standard drinks    Comment: rarely  . Drug use: No  . Sexual activity: Not Currently

## 2019-01-17 ENCOUNTER — Other Ambulatory Visit: Payer: Self-pay

## 2019-01-24 ENCOUNTER — Ambulatory Visit: Payer: Managed Care, Other (non HMO) | Admitting: Family

## 2019-01-24 ENCOUNTER — Other Ambulatory Visit: Payer: Managed Care, Other (non HMO)

## 2019-01-25 NOTE — Pre-Procedure Instructions (Addendum)
ZINNIA TINDALL  01/25/2019      [facility] - [place], [place] - [address] [place] [address] Phone: [phone] Fax: [phone]    Your procedure is scheduled on January 29, 2019.  Report to [facility] Admitting at 530 AM.  Call this number if you have problems the morning of surgery:  [phone]  Call [phone] if you have any questions prior to your surgery date Monday-Friday 8am-4pm    Remember:  Do not eat after midnight.  You may drink clear liquids until 430 AM.  Clear liquids allowed are:                    Water, Juice (non-citric and without pulp), Clear Tea, Black Coffee only and Gatorade  Please complete your PRE-SURGERY ENSURE that was provided to you by 430 AM the morning of surgery.  Please, if able, drink it in one setting. DO NOT SIP.   Take these medicines the morning of surgery with A SIP OF WATER  Acetaminophen (tylenol) Bupropion (Wellbutrin) Tramadol (Ultram)-If needed  DO NOT take Phentermine for 2 weeks prior to surgery  7 days prior to surgery STOP taking any Aspirin (unless otherwise instructed by your surgeon), Aleve, Naproxen, Ibuprofen, Motrin, Advil, Goody's, BC's, all herbal medications, fish oil, and all vitamins  IF you are a smoker, DO NOT Smoke 24 hours prior to surgery   IF you wear a CPAP at night please bring your mask, tubing, and machine the morning of surgery    Remember that you must have someone to transport you home after your surgery, and remain with you for 24 hours if you are discharged the same day.   Day of surgery:  Do not wear jewelry, make-up or nail polish.  Do not wear lotions, powders, or perfumes, or deodorant.  Do not shave 48 hours prior to surgery.  Do not bring valuables to the hospital.  [facility] is not responsible for any belongings or valuables.  Contacts, dentures or bridgework may not be worn into surgery.  Leave your suitcase in the car.  After  surgery it may be brought to your room.  For patients admitted to the hospital, discharge time will be determined by your treatment team.  Patients discharged the day of surgery will not be allowed to drive home.    [facility]- Preparing For Surgery  Before surgery, you can play an important role. Because skin is not sterile, your skin needs to be as free of germs as possible. You can reduce the number of germs on your skin by washing with CHG (chlorahexidine gluconate) Soap before surgery.  CHG is an antiseptic cleaner which kills germs and bonds with the skin to continue killing germs even after washing.    Oral Hygiene is also important to reduce your risk of infection.  Remember - BRUSH YOUR TEETH THE MORNING OF SURGERY WITH YOUR REGULAR TOOTHPASTE  Please do not use if you have an allergy to CHG or antibacterial soaps. If your skin becomes reddened/irritated stop using the CHG.  Do not shave (including legs and underarms) for at least 48 hours prior to first CHG shower. It is OK to shave your face.  Please follow these instructions carefully.   1. Shower the NIGHT BEFORE SURGERY and the MORNING OF SURGERY with CHG.   2. If you chose to wash your hair, wash your hair first as usual with your  normal shampoo.  3. After you shampoo, rinse your hair and body thoroughly to remove the shampoo.  4. Use CHG as you would any other liquid soap. You can apply CHG directly to the skin and wash gently with a scrungie or a clean washcloth.   5. Apply the CHG Soap to your body ONLY FROM THE NECK DOWN.  Do not use on open wounds or open sores. Avoid contact with your eyes, ears, mouth and genitals (private parts). Wash Face and genitals (private parts)  with your normal soap.  6. Wash thoroughly, paying special attention to the area where your surgery will be performed.  7. Thoroughly rinse your body with warm water from the neck down.  8. DO NOT shower/wash with your normal soap after using and  rinsing off the CHG Soap.  9. Pat yourself dry with a CLEAN TOWEL.  10. Wear CLEAN PAJAMAS to bed the night before surgery, wear comfortable clothes the morning of surgery  11. Place CLEAN SHEETS on your bed the night of your first shower and DO NOT SLEEP WITH PETS.  Day of Surgery: Shower as above Do not apply any deodorants/lotions.  Please wear clean clothes to the hospital/surgery center.   Remember to brush your teeth WITH YOUR REGULAR TOOTHPASTE.  Please read over the following fact sheets that you were given.

## 2019-01-26 ENCOUNTER — Ambulatory Visit (HOSPITAL_COMMUNITY)
Admission: RE | Admit: 2019-01-26 | Discharge: 2019-01-26 | Disposition: A | Discharge status: Home / Self Care | Payer: Managed Care, Other (non HMO) | Source: Ambulatory Visit | Attending: Physician Assistant | Admitting: Physician Assistant

## 2019-01-26 ENCOUNTER — Other Ambulatory Visit: Payer: Self-pay

## 2019-01-26 ENCOUNTER — Encounter (HOSPITAL_COMMUNITY)
Admission: RE | Admit: 2019-01-26 | Discharge: 2019-01-26 | Disposition: A | Discharge status: Home / Self Care | Payer: Managed Care, Other (non HMO) | Source: Ambulatory Visit | Attending: Orthopaedic Surgery | Admitting: Orthopaedic Surgery

## 2019-01-26 ENCOUNTER — Other Ambulatory Visit (HOSPITAL_COMMUNITY)
Admission: RE | Admit: 2019-01-26 | Discharge: 2019-01-26 | Disposition: A | Discharge status: Home / Self Care | Payer: Managed Care, Other (non HMO) | Source: Ambulatory Visit | Attending: Orthopaedic Surgery | Admitting: Orthopaedic Surgery

## 2019-01-26 ENCOUNTER — Encounter (HOSPITAL_COMMUNITY): Payer: Self-pay

## 2019-01-26 DIAGNOSIS — M1712 Unilateral primary osteoarthritis, left knee: Secondary | ICD-10-CM | POA: Insufficient documentation

## 2019-01-26 DIAGNOSIS — Z20828 Contact with and (suspected) exposure to other viral communicable diseases: Secondary | ICD-10-CM | POA: Insufficient documentation

## 2019-01-26 DIAGNOSIS — Z01818 Encounter for other preprocedural examination: Secondary | ICD-10-CM | POA: Insufficient documentation

## 2019-01-26 HISTORY — DX: Type 2 diabetes mellitus without complications: E11.9

## 2019-01-26 HISTORY — DX: Headache, unspecified: R51.9

## 2019-01-26 LAB — SURGICAL PCR SCREEN
MRSA, PCR: NEGATIVE
Staphylococcus aureus: NEGATIVE

## 2019-01-26 LAB — CBC WITH DIFFERENTIAL/PLATELET
Abs Immature Granulocytes: 0.02 10*3/uL (ref 0.00–0.07)
Basophils Absolute: 0 10*3/uL (ref 0.0–0.1)
Basophils Relative: 1 %
Eosinophils Absolute: 0.1 10*3/uL (ref 0.0–0.5)
Eosinophils Relative: 1 %
HCT: 41.6 % (ref 36.0–46.0)
Hemoglobin: 13.2 g/dL (ref 12.0–15.0)
Immature Granulocytes: 1 %
Lymphocytes Relative: 30 %
Lymphs Abs: 1.3 10*3/uL (ref 0.7–4.0)
MCH: 26.4 pg (ref 26.0–34.0)
MCHC: 31.7 g/dL (ref 30.0–36.0)
MCV: 83.2 fL (ref 80.0–100.0)
Monocytes Absolute: 0.4 10*3/uL (ref 0.1–1.0)
Monocytes Relative: 9 %
Neutro Abs: 2.6 10*3/uL (ref 1.7–7.7)
Neutrophils Relative %: 58 %
Platelets: 260 10*3/uL (ref 150–400)
RBC: 5 MIL/uL (ref 3.87–5.11)
RDW: 14.9 % (ref 11.5–15.5)
WBC: 4.4 10*3/uL (ref 4.0–10.5)
nRBC: 0 % (ref 0.0–0.2)

## 2019-01-26 LAB — GLUCOSE, CAPILLARY: Glucose-Capillary: 87 mg/dL (ref 70–99)

## 2019-01-26 LAB — HEMOGLOBIN A1C
Hgb A1c MFr Bld: 5.6 % (ref 4.8–5.6)
Mean Plasma Glucose: 114.02 mg/dL

## 2019-01-26 LAB — SARS CORONAVIRUS 2 (TAT 6-24 HRS): SARS Coronavirus 2: NEGATIVE

## 2019-01-26 LAB — COMPREHENSIVE METABOLIC PANEL
ALT: 24 U/L (ref 0–44)
AST: 22 U/L (ref 15–41)
Albumin: 3.8 g/dL (ref 3.5–5.0)
Alkaline Phosphatase: 69 U/L (ref 38–126)
Anion gap: 7 (ref 5–15)
BUN: 7 mg/dL (ref 6–20)
CO2: 28 mmol/L (ref 22–32)
Calcium: 9 mg/dL (ref 8.9–10.3)
Chloride: 104 mmol/L (ref 98–111)
Creatinine, Ser: 0.84 mg/dL (ref 0.44–1.00)
GFR calc Af Amer: >60 mL/min (ref >60)
GFR calc non Af Amer: >60 mL/min (ref >60)
Glucose, Bld: 112 mg/dL — ABNORMAL HIGH (ref 70–99)
Potassium: 3.6 mmol/L (ref 3.5–5.1)
Sodium: 139 mmol/L (ref 135–145)
Total Bilirubin: 0.5 mg/dL (ref 0.3–1.2)
Total Protein: 6.6 g/dL (ref 6.5–8.1)

## 2019-01-26 LAB — TYPE AND SCREEN
ABO/RH(D): O POS
Antibody Screen: NEGATIVE

## 2019-01-26 LAB — PROTIME-INR
INR: 1 (ref 0.8–1.2)
Prothrombin Time: 12.6 seconds (ref 11.4–15.2)

## 2019-01-26 LAB — APTT: aPTT: 30 seconds (ref 24–36)

## 2019-01-26 MED ORDER — TRANEXAMIC ACID-NACL 1000-0.7 MG/100ML-% IV SOLN
1000.0000 mg | INTRAVENOUS | Status: AC
  Administered 2019-01-29: 08:00:00 1000 mg via INTRAVENOUS
  Filled 2019-01-26: qty 100

## 2019-01-26 MED ORDER — TRANEXAMIC ACID 1000 MG/10ML IV SOLN
2000.0000 mg | INTRAVENOUS | Status: AC
  Administered 2019-01-29: 2000 mg via TOPICAL
  Filled 2019-01-26: qty 20

## 2019-01-26 NOTE — Progress Notes (Signed)
PCP: Dr. Vista Lawman Cardiologist: denies  EKG: Today CXR: Today ECHO: denies Stress Test: denies Cardiac Cath: denies  Fasting Blood Sugar- 87 Checks Blood Sugar: rarely--not on meds, diet and gastric bypass  DM "resolved"  Patient denies shortness of breath, fever, cough, and chest pain at PAT appointment.  Patient verbalized understanding of instructions provided today at the PAT appointment.  Patient asked to review instructions at home and day of surgery.

## 2019-01-26 NOTE — Pre-Procedure Instructions (Signed)
Brandi Lutz  01/26/2019      [facility] - [place], [place] - [address] [place] [address] Phone: [phone] Fax: [phone]    Your procedure is scheduled on January 29, 2019.  Report to [facility] Admitting at 5:30 AM.  Call this number if you have problems the morning of surgery:  [phone]  Call [phone] if you have any questions prior to your surgery date Monday-Friday 8am-4pm    Remember:  Do not eat after midnight.  You may drink clear liquids until 430 AM.  Clear liquids allowed are:                    Water, Juice (non-citric and without pulp), Clear Tea, Black Coffee only and Gatorade  Please complete your Gatorade that was provided to you by 4:30 AM the morning of surgery.  Please, if able, drink it in one setting. DO NOT SIP.   Take these medicines the morning of surgery with A SIP OF WATER  Acetaminophen (tylenol) Bupropion (Wellbutrin) Tramadol (Ultram)-If needed Setraline (Zoloft)   DO NOT take Phentermine for 2 weeks prior to surgery  7 days prior to surgery STOP taking any Aspirin (unless otherwise instructed by your surgeon), Aleve, Naproxen, Ibuprofen, Motrin, Advil, Goody's, BC's, all herbal medications, fish oil, and all vitamins   Enhanced Recovery after Surgery for Orthopedics Enhanced Recovery after Surgery is a protocol used to improve the stress on your body and your recovery after surgery.  Patient Instructions  . The night before surgery:  o No food after midnight. ONLY clear liquids after midnight   . The day of surgery (if you have diabetes): o  o Drink ONE (1) Gatorade 2 (G2) as directed. o This drink was given to you during your hospital  pre-op appointment visit.  o The pre-op nurse will instruct you on the time to drink the   Gatorade 2 (G2) depending on your surgery time. o Color of the Gatorade may vary. Red is not allowed. o Nothing else to drink after completing  the  Gatorade 2 (G2).         If you have questions, please contact your surgeon's office.    How to Manage Your Diabetes Before and After Surgery  Why is it important to control my blood sugar before and after surgery? . Improving blood sugar levels before and after surgery helps healing and can limit problems. . A way of improving blood sugar control is eating a healthy diet by: o  Eating less sugar and carbohydrates o  Increasing activity/exercise o  Talking with your doctor about reaching your blood sugar goals . High blood sugars (greater than 180 mg/dL) can raise your risk of infections and slow your recovery, so you will need to focus on controlling your diabetes during the weeks before surgery. . Make sure that the doctor who takes care of your diabetes knows about your planned surgery including the date and location.  How do I manage my blood sugar before surgery? . Check your blood sugar at least 4 times a day, starting 2 days before surgery, to make sure that the level is not too high or low. o Check your blood sugar the morning of your surgery when you wake up and every 2 hours until you get to the Short Stay unit. . If your blood sugar is less than 70 mg/dL, you will need to treat for  low blood sugar: o Do not take insulin. o Treat a low blood sugar (less than 70 mg/dL) with  cup of clear juice (cranberry or apple), 4 glucose tablets, OR glucose gel. o Recheck blood sugar in 15 minutes after treatment (to make sure it is greater than 70 mg/dL). If your blood sugar is not greater than 70 mg/dL on recheck, call [phone] for further instructions. . Report your blood sugar to the short stay nurse when you get to Short Stay.  . If you are admitted to the hospital after surgery: o Your blood sugar will be checked by the staff and you will probably be given insulin after surgery (instead of oral diabetes medicines) to make sure you have good blood sugar levels. o The goal for  blood sugar control after surgery is 80-180 mg/dL.    Remember that you must have someone to transport you home after your surgery, and remain with you for 24 hours if you are discharged the same day.   Day of surgery:  Do not wear jewelry, make-up or nail polish.  Do not wear lotions, powders, or perfumes, or deodorant.  Do not shave 48 hours prior to surgery.  Do not bring valuables to the hospital.  [facility] is not responsible for any belongings or valuables.  Contacts, dentures or bridgework may not be worn into surgery.  Leave your suitcase in the car.  After surgery it may be brought to your room.  For patients admitted to the hospital, discharge time will be determined by your treatment team.  Patients discharged the day of surgery will not be allowed to drive home.    [facility]- Preparing For Surgery  Before surgery, you can play an important role. Because skin is not sterile, your skin needs to be as free of germs as possible. You can reduce the number of germs on your skin by washing with CHG (chlorahexidine gluconate) Soap before surgery.  CHG is an antiseptic cleaner which kills germs and bonds with the skin to continue killing germs even after washing.    Oral Hygiene is also important to reduce your risk of infection.  Remember - BRUSH YOUR TEETH THE MORNING OF SURGERY WITH YOUR REGULAR TOOTHPASTE  Please do not use if you have an allergy to CHG or antibacterial soaps. If your skin becomes reddened/irritated stop using the CHG.  Do not shave (including legs and underarms) for at least 48 hours prior to first CHG shower. It is OK to shave your face.  Please follow these instructions carefully.   1. Shower the NIGHT BEFORE SURGERY and the MORNING OF SURGERY with CHG.   2. If you chose to wash your hair, wash your hair first as usual with your normal shampoo.  3. After you shampoo, rinse your hair and body thoroughly to remove the shampoo.  4. Use CHG as you would  any other liquid soap. You can apply CHG directly to the skin and wash gently with a scrungie or a clean washcloth.   5. Apply the CHG Soap to your body ONLY FROM THE NECK DOWN.  Do not use on open wounds or open sores. Avoid contact with your eyes, ears, mouth and genitals (private parts). Wash Face and genitals (private parts)  with your normal soap.  6. Wash thoroughly, paying special attention to the area where your surgery will be performed.  7. Thoroughly rinse your body with warm water from the neck down.  8. DO NOT shower/wash with your  normal soap after using and rinsing off the CHG Soap.  9. Pat yourself dry with a CLEAN TOWEL.  10. Wear CLEAN PAJAMAS to bed the night before surgery, wear comfortable clothes the morning of surgery  11. Place CLEAN SHEETS on your bed the night of your first shower and DO NOT SLEEP WITH PETS.  Day of Surgery: Shower as above Do not apply any deodorants/lotions.  Please wear clean clothes to the hospital/surgery center.   Remember to brush your teeth WITH YOUR REGULAR TOOTHPASTE.  Please read over the following fact sheets that you were given.

## 2019-01-26 NOTE — Anesthesia Preprocedure Evaluation (Addendum)
Anesthesia Evaluation  Patient identified by MRN, date of birth, ID band Patient awake    Reviewed: Allergy & Precautions, NPO status , Patient's Chart, lab work & pertinent test results  History of Anesthesia Complications (+) PONVNegative for: history of anesthetic complications  Airway Mallampati: II  TM Distance: >3 FB Neck ROM: Full    Dental  (+) Teeth Intact   Pulmonary neg pulmonary ROS,    Pulmonary exam normal        Cardiovascular negative cardio ROS Normal cardiovascular exam     Neuro/Psych PSYCHIATRIC DISORDERS Anxiety Depression negative neurological ROS     GI/Hepatic negative GI ROS, Neg liver ROS,   Endo/Other  diabetes, Well Controlled  Renal/GU negative Renal ROS  negative genitourinary   Musculoskeletal  (+) Arthritis ,   Abdominal   Peds  Hematology negative hematology ROS (+)   Anesthesia Other Findings S/p gastric bypass (2014) Plts 260  Reproductive/Obstetrics                           Anesthesia Physical Anesthesia Plan  ASA: II  Anesthesia Plan: Spinal   Post-op Pain Management:    Induction:   PONV Risk Score and Plan: 3 and Ondansetron, Propofol infusion, Treatment may vary due to age or medical condition and Midazolam  Airway Management Planned: Nasal Cannula and Simple Face Mask  Additional Equipment: None  Intra-op Plan:   Post-operative Plan:   Informed Consent: I have reviewed the patients History and Physical, chart, labs and discussed the procedure including the risks, benefits and alternatives for the proposed anesthesia with the patient or authorized representative who has indicated his/her understanding and acceptance.       Plan Discussed with:   Anesthesia Plan Comments: (PAT note written 01/26/2019 by Myra Gianotti, PA-C. )     Anesthesia Quick Evaluation

## 2019-01-26 NOTE — Progress Notes (Signed)
Anesthesia Chart Review:  Case: [id number] Date/Time: 01/29/19 0715   Procedure: CONVERSION OF LEFT UNICOMPARTMENTAL KNEE ARTHROPLASTY TO LEFT TOTAL KNEE ARTHROPLASTY (Left Knee)   Anesthesia type: Spinal   Pre-op diagnosis: left knee degenerative joint disease   Location: [facility] OR ROOM 04 / [facility]   Surgeon: Leandrew Koyanagi, MD      DISCUSSION: [name] scheduled for the above procedure.  History includes never smoker, post-operative N/V, DM2 (in remission after gastric bypass 2014), colon cancer (s/p colectomy), iron deficiency anemia. BMI is consistent with obesity.  PAT labs and EKG appear acceptable for OR.  She denied shortness of breath, cough, fever, chest pain at PAT RN visit.  Phentermine is listed as being on hold for surgery.  Presurgical COVID test from 01/26/2019 is still in process.  If negative and otherwise no acute changes, then I would anticipate that she could proceed as planned.   VS: BP 129/80   Pulse 87   Temp 36.5 C   Resp 20   Ht 5' 7.5" (1.715 m)   Wt 104.4 kg   LMP 01/07/2019   SpO2 100%   BMI 35.52 kg/m   PROVIDERS: Benito Mccreedy, MD is PCP   LABS: Labs reviewed: Acceptable for surgery. (all labs ordered are listed, but only abnormal results are displayed)  Labs Reviewed  COMPREHENSIVE METABOLIC PANEL - Abnormal; Notable for the following components:      Result Value   Glucose, Bld 112 (*)    All other components within normal limits  SURGICAL PCR SCREEN  GLUCOSE, CAPILLARY  APTT  CBC WITH DIFFERENTIAL/PLATELET  PROTIME-INR  HEMOGLOBIN A1C  TYPE AND SCREEN     IMAGES: CXR 01/26/19: FINDINGS: The lungs are clear. Heart size and pulmonary vascularity are normal. No adenopathy. No bone lesions. IMPRESSION: No abnormality noted.   EKG: 01/26/19: Normal sinus rhythm Possible Left atrial enlargement Borderline ECG   CV: N/A   Past Medical History:  Diagnosis Date  . Anxiety   . Arthritis   . Cancer ([place])     colon  . Depression    takes Wellbutrin and Zoloft daily  . Diabetes mellitus without complication ([place])    diet and gastric surgery controlled  . Gastric bypass status for obesity 02/05/2013  . Headache   . History of colon polyps    benign  . History of migraine    none since high school  . Joint pain   . Joint swelling   . Other specified iron deficiency anemias 02/05/2013   2 iron infusions since gastric bypass;no abnormal reaction noted  . PONV (postoperative nausea and vomiting)   . 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 APPENDECTOMY    . ARTHROSCOPIC REPAIR ACL Left 20 yrs ago  . CHOLECYSTECTOMY    . COLECTOMY    . COLONOSCOPY    . GASTRIC BYPASS    . herniated bowel repair    . KNEE ARTHROSCOPY Left   . PARTIAL KNEE ARTHROPLASTY Left 08/05/2014   Procedure: LEFT UNICOMPARTMENTAL KNEE;  Surgeon: Marianna Payment, MD;  Location: [facility];  Service: Orthopedics;  Laterality: Left;    MEDICATIONS: . acetaminophen (TYLENOL) 500 MG tablet  . buPROPion (WELLBUTRIN XL) 150 MG 24 hr tablet  . clindamycin-tretinoin (ZIANA) gel  . diphenhydrAMINE (SOMINEX) 25 MG tablet  . Multiple Vitamin (MULTIVITAMIN WITH MINERALS) TABS tablet  . phentermine (ADIPEX-P) 37.5 MG tablet  . sertraline (  ZOLOFT) 50 MG tablet  . traMADol (ULTRAM) 50 MG tablet   No current facility-administered medications for this encounter.    Derrill Memo ON 01/29/2019] tranexamic acid (CYKLOKAPRON) 2,000 mg in sodium chloride 0.9 % 50 mL Topical Application  . [START ON 01/29/2019] tranexamic acid (CYKLOKAPRON) IVPB 1,000 mg     Myra Gianotti, PA-C Surgical Short Stay/Anesthesiology [facility] Phone [phone] [facility] Phone [phone] 01/26/2019 5:08 PM

## 2019-01-29 ENCOUNTER — Inpatient Hospital Stay (HOSPITAL_COMMUNITY): Payer: Managed Care, Other (non HMO)

## 2019-01-29 ENCOUNTER — Inpatient Hospital Stay (HOSPITAL_COMMUNITY): Payer: Managed Care, Other (non HMO) | Admitting: Certified Registered Nurse Anesthetist

## 2019-01-29 ENCOUNTER — Encounter (HOSPITAL_COMMUNITY): Payer: Self-pay

## 2019-01-29 ENCOUNTER — Other Ambulatory Visit: Payer: Self-pay

## 2019-01-29 ENCOUNTER — Encounter (HOSPITAL_COMMUNITY)
Admission: RE | Disposition: A | Discharge status: Home / Self Care | Payer: Self-pay | Source: Ambulatory Visit | Attending: Orthopaedic Surgery

## 2019-01-29 ENCOUNTER — Inpatient Hospital Stay (HOSPITAL_COMMUNITY)
Admission: RE | Admit: 2019-01-29 | Discharge: 2019-01-31 | DRG: 467 | Disposition: A | Discharge status: Home Health | Payer: Managed Care, Other (non HMO) | Attending: Orthopaedic Surgery | Admitting: Orthopaedic Surgery

## 2019-01-29 DIAGNOSIS — Z96652 Presence of left artificial knee joint: Secondary | ICD-10-CM

## 2019-01-29 DIAGNOSIS — Z9049 Acquired absence of other specified parts of digestive tract: Secondary | ICD-10-CM | POA: Diagnosis not present

## 2019-01-29 DIAGNOSIS — F419 Anxiety disorder, unspecified: Secondary | ICD-10-CM | POA: Diagnosis present

## 2019-01-29 DIAGNOSIS — Z9884 Bariatric surgery status: Secondary | ICD-10-CM | POA: Diagnosis not present

## 2019-01-29 DIAGNOSIS — E119 Type 2 diabetes mellitus without complications: Secondary | ICD-10-CM | POA: Diagnosis present

## 2019-01-29 DIAGNOSIS — Z8719 Personal history of other diseases of the digestive system: Secondary | ICD-10-CM | POA: Diagnosis not present

## 2019-01-29 DIAGNOSIS — M1712 Unilateral primary osteoarthritis, left knee: Secondary | ICD-10-CM | POA: Diagnosis present

## 2019-01-29 DIAGNOSIS — Z79899 Other long term (current) drug therapy: Secondary | ICD-10-CM

## 2019-01-29 DIAGNOSIS — Z79891 Long term (current) use of opiate analgesic: Secondary | ICD-10-CM | POA: Diagnosis not present

## 2019-01-29 DIAGNOSIS — D62 Acute posthemorrhagic anemia: Secondary | ICD-10-CM | POA: Diagnosis not present

## 2019-01-29 DIAGNOSIS — Z20828 Contact with and (suspected) exposure to other viral communicable diseases: Secondary | ICD-10-CM | POA: Diagnosis present

## 2019-01-29 DIAGNOSIS — F329 Major depressive disorder, single episode, unspecified: Secondary | ICD-10-CM | POA: Diagnosis present

## 2019-01-29 DIAGNOSIS — Z85038 Personal history of other malignant neoplasm of large intestine: Secondary | ICD-10-CM | POA: Diagnosis not present

## 2019-01-29 DIAGNOSIS — M25762 Osteophyte, left knee: Secondary | ICD-10-CM | POA: Diagnosis present

## 2019-01-29 DIAGNOSIS — Z91048 Other nonmedicinal substance allergy status: Secondary | ICD-10-CM | POA: Diagnosis not present

## 2019-01-29 HISTORY — PX: TOTAL KNEE REVISION: SHX996

## 2019-01-29 HISTORY — PX: TOTAL KNEE ARTHROPLASTY: SHX125

## 2019-01-29 LAB — GLUCOSE, CAPILLARY
Glucose-Capillary: 91 mg/dL (ref 70–99)
Glucose-Capillary: 78 mg/dL (ref 70–99)
Glucose-Capillary: 162 mg/dL — ABNORMAL HIGH (ref 70–99)

## 2019-01-29 LAB — POCT PREGNANCY, URINE: Preg Test, Ur: NEGATIVE

## 2019-01-29 SURGERY — TOTAL KNEE REVISION
Anesthesia: Spinal | Site: Knee | Laterality: Left

## 2019-01-29 MED ORDER — DIPHENHYDRAMINE HCL 25 MG PO CAPS: 25.0000 mg | ORAL_CAPSULE | Freq: Every evening | ORAL | Status: DC | PRN

## 2019-01-29 MED ORDER — GABAPENTIN 300 MG PO CAPS
300.0000 mg | ORAL_CAPSULE | Freq: Three times a day (TID) | ORAL | Status: DC
  Administered 2019-01-29 – 2019-01-31 (×6): 300 mg via ORAL
  Filled 2019-01-29 (×7): qty 1

## 2019-01-29 MED ORDER — ACETAMINOPHEN 500 MG PO TABS
1000.0000 mg | ORAL_TABLET | Freq: Four times a day (QID) | ORAL | Status: AC
  Administered 2019-01-29 – 2019-01-30 (×4): 1000 mg via ORAL
  Filled 2019-01-29 (×4): qty 2

## 2019-01-29 MED ORDER — LIDOCAINE 2% (20 MG/ML) 5 ML SYRINGE
INTRAMUSCULAR | Status: DC | PRN
  Administered 2019-01-29: 40 mg via INTRAVENOUS

## 2019-01-29 MED ORDER — POVIDONE-IODINE 10 % EX SWAB: 2.0000 "application " | Freq: Once | CUTANEOUS | Status: DC

## 2019-01-29 MED ORDER — LACTATED RINGERS IV SOLN: INTRAVENOUS | Status: DC

## 2019-01-29 MED ORDER — ACETAMINOPHEN 325 MG PO TABS
325.0000 mg | ORAL_TABLET | Freq: Four times a day (QID) | ORAL | Status: DC | PRN
  Administered 2019-01-30: 650 mg via ORAL
  Filled 2019-01-29: qty 2

## 2019-01-29 MED ORDER — CHLORHEXIDINE GLUCONATE 4 % EX LIQD: 60.0000 mL | Freq: Once | CUTANEOUS | Status: DC

## 2019-01-29 MED ORDER — MIDAZOLAM HCL 2 MG/2ML IJ SOLN
INTRAMUSCULAR | Status: AC
  Filled 2019-01-29: qty 2

## 2019-01-29 MED ORDER — FENTANYL CITRATE (PF) 100 MCG/2ML IJ SOLN: 25.0000 ug | INTRAMUSCULAR | Status: DC | PRN

## 2019-01-29 MED ORDER — CEFAZOLIN SODIUM-DEXTROSE 2-4 GM/100ML-% IV SOLN
2.0000 g | Freq: Four times a day (QID) | INTRAVENOUS | Status: AC
  Administered 2019-01-29 – 2019-01-30 (×3): 2 g via INTRAVENOUS
  Filled 2019-01-29 (×3): qty 100

## 2019-01-29 MED ORDER — OXYCODONE HCL 5 MG PO TABS: 5.0000 mg | ORAL_TABLET | Freq: Once | ORAL | Status: DC | PRN

## 2019-01-29 MED ORDER — SULFAMETHOXAZOLE-TRIMETHOPRIM 800-160 MG PO TABS: 1.0000 | ORAL_TABLET | Freq: Two times a day (BID) | ORAL | 0 refills | Status: DC

## 2019-01-29 MED ORDER — METHOCARBAMOL 1000 MG/10ML IJ SOLN
500.0000 mg | Freq: Four times a day (QID) | INTRAVENOUS | Status: DC | PRN
  Filled 2019-01-29: qty 5

## 2019-01-29 MED ORDER — ROPIVACAINE HCL 5 MG/ML IJ SOLN
INTRAMUSCULAR | Status: DC | PRN
  Administered 2019-01-29: 30 mL via PERINEURAL

## 2019-01-29 MED ORDER — DIPHENHYDRAMINE HCL (SLEEP) 25 MG PO TABS: 25.0000 mg | ORAL_TABLET | Freq: Every evening | ORAL | Status: DC | PRN

## 2019-01-29 MED ORDER — BUPIVACAINE IN DEXTROSE 0.75-8.25 % IT SOLN
INTRATHECAL | Status: DC | PRN
  Administered 2019-01-29: 1.8 mL via INTRATHECAL

## 2019-01-29 MED ORDER — SODIUM CHLORIDE 0.9 % IV SOLN
INTRAVENOUS | Status: DC | PRN
  Administered 2019-01-29: 25 ug/min via INTRAVENOUS

## 2019-01-29 MED ORDER — OXYCODONE HCL 5 MG/5ML PO SOLN: 5.0000 mg | Freq: Once | ORAL | Status: DC | PRN

## 2019-01-29 MED ORDER — ASPIRIN 81 MG PO CHEW
81.0000 mg | CHEWABLE_TABLET | Freq: Two times a day (BID) | ORAL | Status: DC
  Administered 2019-01-29 – 2019-01-31 (×4): 81 mg via ORAL
  Filled 2019-01-29 (×4): qty 1

## 2019-01-29 MED ORDER — CELECOXIB 200 MG PO CAPS
200.0000 mg | ORAL_CAPSULE | Freq: Two times a day (BID) | ORAL | Status: DC
  Administered 2019-01-30 – 2019-01-31 (×2): 200 mg via ORAL
  Filled 2019-01-29 (×2): qty 1

## 2019-01-29 MED ORDER — HYDROMORPHONE HCL 1 MG/ML IJ SOLN
0.5000 mg | INTRAMUSCULAR | Status: DC | PRN
  Administered 2019-01-29: 1 mg via INTRAVENOUS
  Filled 2019-01-29: qty 1

## 2019-01-29 MED ORDER — BUPIVACAINE LIPOSOME 1.3 % IJ SUSP
20.0000 mL | INTRAMUSCULAR | Status: AC
  Administered 2019-01-29: 20 mL
  Filled 2019-01-29: qty 20

## 2019-01-29 MED ORDER — ONDANSETRON HCL 4 MG/2ML IJ SOLN
4.0000 mg | Freq: Four times a day (QID) | INTRAMUSCULAR | Status: DC | PRN
  Administered 2019-01-30 (×2): 4 mg via INTRAVENOUS
  Filled 2019-01-29 (×2): qty 2

## 2019-01-29 MED ORDER — METOCLOPRAMIDE HCL 5 MG/ML IJ SOLN: 5.0000 mg | Freq: Three times a day (TID) | INTRAMUSCULAR | Status: DC | PRN

## 2019-01-29 MED ORDER — OXYCODONE HCL 5 MG PO TABS
5.0000 mg | ORAL_TABLET | ORAL | Status: DC | PRN
  Administered 2019-01-29 – 2019-01-30 (×4): 5 mg via ORAL
  Administered 2019-01-31: 10 mg via ORAL
  Administered 2019-01-31: 5 mg via ORAL
  Filled 2019-01-29: qty 1
  Filled 2019-01-29: qty 2
  Filled 2019-01-29 (×3): qty 1
  Filled 2019-01-29: qty 2
  Filled 2019-01-29: qty 1

## 2019-01-29 MED ORDER — PHENYLEPHRINE 40 MCG/ML (10ML) SYRINGE FOR IV PUSH (FOR BLOOD PRESSURE SUPPORT)
PREFILLED_SYRINGE | INTRAVENOUS | Status: DC | PRN
  Administered 2019-01-29: 80 ug via INTRAVENOUS
  Administered 2019-01-29: 40 ug via INTRAVENOUS

## 2019-01-29 MED ORDER — KETOROLAC TROMETHAMINE 15 MG/ML IJ SOLN
30.0000 mg | Freq: Four times a day (QID) | INTRAMUSCULAR | Status: AC
  Administered 2019-01-29 – 2019-01-30 (×3): 30 mg via INTRAVENOUS
  Filled 2019-01-29 (×3): qty 2

## 2019-01-29 MED ORDER — SORBITOL 70 % SOLN: 30.0000 mL | Freq: Every day | Status: DC | PRN

## 2019-01-29 MED ORDER — MENTHOL 3 MG MT LOZG: 1.0000 | LOZENGE | OROMUCOSAL | Status: DC | PRN

## 2019-01-29 MED ORDER — ALUM & MAG HYDROXIDE-SIMETH 200-200-20 MG/5ML PO SUSP: 30.0000 mL | ORAL | Status: DC | PRN

## 2019-01-29 MED ORDER — MIDAZOLAM HCL 5 MG/5ML IJ SOLN
INTRAMUSCULAR | Status: DC | PRN
  Administered 2019-01-29: 2 mg via INTRAVENOUS

## 2019-01-29 MED ORDER — SODIUM CHLORIDE 0.9% FLUSH
INTRAVENOUS | Status: DC | PRN
  Administered 2019-01-29: 40 mL via INTRAVENOUS

## 2019-01-29 MED ORDER — PROPOFOL 500 MG/50ML IV EMUL
INTRAVENOUS | Status: DC | PRN
  Administered 2019-01-29: 75 ug/kg/min via INTRAVENOUS
  Administered 2019-01-29: 09:00:00 via INTRAVENOUS

## 2019-01-29 MED ORDER — METOCLOPRAMIDE HCL 5 MG PO TABS: 5.0000 mg | ORAL_TABLET | Freq: Three times a day (TID) | ORAL | Status: DC | PRN

## 2019-01-29 MED ORDER — FENTANYL CITRATE (PF) 250 MCG/5ML IJ SOLN
INTRAMUSCULAR | Status: AC
  Filled 2019-01-29: qty 5

## 2019-01-29 MED ORDER — DOCUSATE SODIUM 100 MG PO CAPS
100.0000 mg | ORAL_CAPSULE | Freq: Two times a day (BID) | ORAL | Status: DC
  Administered 2019-01-29 – 2019-01-31 (×4): 100 mg via ORAL
  Filled 2019-01-29 (×4): qty 1

## 2019-01-29 MED ORDER — ASPIRIN EC 81 MG PO TBEC: 81.0000 mg | DELAYED_RELEASE_TABLET | Freq: Two times a day (BID) | ORAL | 0 refills | Status: DC

## 2019-01-29 MED ORDER — DIPHENHYDRAMINE HCL 12.5 MG/5ML PO ELIX: 25.0000 mg | ORAL_SOLUTION | ORAL | Status: DC | PRN

## 2019-01-29 MED ORDER — OXYCODONE HCL ER 10 MG PO T12A
10.0000 mg | EXTENDED_RELEASE_TABLET | Freq: Two times a day (BID) | ORAL | Status: DC
  Administered 2019-01-29 – 2019-01-31 (×4): 10 mg via ORAL
  Filled 2019-01-29 (×4): qty 1

## 2019-01-29 MED ORDER — ONDANSETRON HCL 4 MG PO TABS: 4.0000 mg | ORAL_TABLET | Freq: Three times a day (TID) | ORAL | 0 refills | Status: DC | PRN

## 2019-01-29 MED ORDER — VANCOMYCIN HCL 1000 MG IV SOLR
INTRAVENOUS | Status: DC | PRN
  Administered 2019-01-29: 1000 mg via TOPICAL

## 2019-01-29 MED ORDER — TRANEXAMIC ACID-NACL 1000-0.7 MG/100ML-% IV SOLN
1000.0000 mg | Freq: Once | INTRAVENOUS | Status: AC
  Administered 2019-01-29: 1000 mg via INTRAVENOUS
  Filled 2019-01-29: qty 100

## 2019-01-29 MED ORDER — LACTATED RINGERS IV SOLN
INTRAVENOUS | Status: DC | PRN
  Administered 2019-01-29: 07:00:00 via INTRAVENOUS

## 2019-01-29 MED ORDER — METHOCARBAMOL 500 MG PO TABS
500.0000 mg | ORAL_TABLET | Freq: Four times a day (QID) | ORAL | Status: DC | PRN
  Administered 2019-01-29 – 2019-01-30 (×3): 500 mg via ORAL
  Filled 2019-01-29 (×2): qty 1

## 2019-01-29 MED ORDER — OXYCODONE HCL 5 MG PO TABS: 5.0000 mg | ORAL_TABLET | Freq: Three times a day (TID) | ORAL | 0 refills | Status: DC | PRN

## 2019-01-29 MED ORDER — SODIUM CHLORIDE 0.9 % IR SOLN
Status: DC | PRN
  Administered 2019-01-29: 3000 mL

## 2019-01-29 MED ORDER — POLYETHYLENE GLYCOL 3350 17 G PO PACK: 17.0000 g | PACK | Freq: Every day | ORAL | Status: DC | PRN

## 2019-01-29 MED ORDER — METHOCARBAMOL 750 MG PO TABS: 750.0000 mg | ORAL_TABLET | Freq: Two times a day (BID) | ORAL | 0 refills | Status: DC | PRN

## 2019-01-29 MED ORDER — ONDANSETRON HCL 4 MG PO TABS
4.0000 mg | ORAL_TABLET | Freq: Four times a day (QID) | ORAL | Status: DC | PRN
  Administered 2019-01-31: 4 mg via ORAL
  Filled 2019-01-29: qty 1

## 2019-01-29 MED ORDER — PROPOFOL 10 MG/ML IV BOLUS
INTRAVENOUS | Status: AC
  Filled 2019-01-29: qty 20

## 2019-01-29 MED ORDER — PROMETHAZINE HCL 25 MG PO TABS: 25.0000 mg | ORAL_TABLET | Freq: Four times a day (QID) | ORAL | 1 refills | Status: DC | PRN

## 2019-01-29 MED ORDER — CELECOXIB 200 MG PO CAPS: 200.0000 mg | ORAL_CAPSULE | Freq: Two times a day (BID) | ORAL | Status: DC

## 2019-01-29 MED ORDER — VANCOMYCIN HCL 1000 MG IV SOLR
INTRAVENOUS | Status: AC
  Filled 2019-01-29: qty 1000

## 2019-01-29 MED ORDER — CEFAZOLIN SODIUM-DEXTROSE 2-4 GM/100ML-% IV SOLN
2.0000 g | INTRAVENOUS | Status: AC
  Administered 2019-01-29: 2 g via INTRAVENOUS
  Filled 2019-01-29: qty 100

## 2019-01-29 MED ORDER — BUPROPION HCL ER (XL) 150 MG PO TB24
150.0000 mg | ORAL_TABLET | Freq: Every day | ORAL | Status: DC
  Administered 2019-01-30 – 2019-01-31 (×2): 150 mg via ORAL
  Filled 2019-01-29 (×2): qty 1

## 2019-01-29 MED ORDER — ONDANSETRON HCL 4 MG/2ML IJ SOLN
INTRAMUSCULAR | Status: DC | PRN
  Administered 2019-01-29: 4 mg via INTRAVENOUS

## 2019-01-29 MED ORDER — OXYCODONE HCL 5 MG PO TABS
10.0000 mg | ORAL_TABLET | ORAL | Status: DC | PRN
  Administered 2019-01-29 – 2019-01-30 (×3): 10 mg via ORAL
  Filled 2019-01-29 (×2): qty 2

## 2019-01-29 MED ORDER — DEXAMETHASONE SODIUM PHOSPHATE 10 MG/ML IJ SOLN
10.0000 mg | Freq: Once | INTRAMUSCULAR | Status: AC
  Administered 2019-01-30: 10 mg via INTRAVENOUS
  Filled 2019-01-29: qty 1

## 2019-01-29 MED ORDER — OXYCODONE HCL ER 10 MG PO T12A: 10.0000 mg | EXTENDED_RELEASE_TABLET | Freq: Two times a day (BID) | ORAL | 0 refills | Status: AC

## 2019-01-29 MED ORDER — SENNOSIDES-DOCUSATE SODIUM 8.6-50 MG PO TABS: 1.0000 | ORAL_TABLET | Freq: Every evening | ORAL | 1 refills | Status: DC | PRN

## 2019-01-29 MED ORDER — MAGNESIUM CITRATE PO SOLN: 1.0000 | Freq: Once | ORAL | Status: DC | PRN

## 2019-01-29 MED ORDER — SERTRALINE HCL 50 MG PO TABS
50.0000 mg | ORAL_TABLET | Freq: Every day | ORAL | Status: DC
  Administered 2019-01-30 – 2019-01-31 (×2): 50 mg via ORAL
  Filled 2019-01-29 (×2): qty 1

## 2019-01-29 MED ORDER — METHOCARBAMOL 500 MG PO TABS
ORAL_TABLET | ORAL | Status: AC
  Filled 2019-01-29: qty 1

## 2019-01-29 MED ORDER — SODIUM CHLORIDE 0.9 % IV SOLN
INTRAVENOUS | Status: DC
  Administered 2019-01-29 – 2019-01-30 (×2): via INTRAVENOUS

## 2019-01-29 MED ORDER — PHENOL 1.4 % MT LIQD: 1.0000 | OROMUCOSAL | Status: DC | PRN

## 2019-01-29 MED ORDER — ONDANSETRON HCL 4 MG/2ML IJ SOLN: 4.0000 mg | Freq: Once | INTRAMUSCULAR | Status: DC | PRN

## 2019-01-29 MED ORDER — FENTANYL CITRATE (PF) 100 MCG/2ML IJ SOLN
INTRAMUSCULAR | Status: DC | PRN
  Administered 2019-01-29 (×2): 50 ug via INTRAVENOUS

## 2019-01-29 SURGICAL SUPPLY — 91 items
BANDAGE ESMARK 6X9 LF (GAUZE/BANDAGES/DRESSINGS) IMPLANT
BIT DRILL 4.5 CANN DISP (BIT) ×1 IMPLANT
BLADE CLIPPER SURG (BLADE) IMPLANT
BLADE SAG 18X100X1.27 (BLADE) ×2 IMPLANT
BLADE SAGITTAL 25.0X1.27X90 (BLADE) ×2 IMPLANT
BNDG ELASTIC 4X5.8 VLCR STR LF (GAUZE/BANDAGES/DRESSINGS) ×1 IMPLANT
BNDG ELASTIC 6X5.8 VLCR STR LF (GAUZE/BANDAGES/DRESSINGS) ×1 IMPLANT
BNDG ESMARK 6X9 LF (GAUZE/BANDAGES/DRESSINGS) ×2
BOWL SMART MIX CTS (DISPOSABLE) ×1 IMPLANT
BRUSH SCRUB EZ  4% CHG (MISCELLANEOUS) ×2
BRUSH SCRUB EZ 4% CHG (MISCELLANEOUS) IMPLANT
CEMENT BONE REFOBACIN R1X40 US (Cement) ×2 IMPLANT
COMP FEM CEMT PERSONA STD SZ7 (Knees) ×2 IMPLANT
COMPONENT FEM CMT PRSN STD SZ7 (Knees) IMPLANT
COVER SURGICAL LIGHT HANDLE (MISCELLANEOUS) ×2 IMPLANT
COVER WAND RF STERILE (DRAPES) ×2 IMPLANT
CUFF TOURN SGL QUICK 34 (TOURNIQUET CUFF)
CUFF TOURN SGL QUICK 42 (TOURNIQUET CUFF) IMPLANT
CUFF TRNQT CYL 34X4.125X (TOURNIQUET CUFF) IMPLANT
DERMABOND ADHESIVE PROPEN (GAUZE/BANDAGES/DRESSINGS) ×1
DERMABOND ADVANCED .7 DNX6 (GAUZE/BANDAGES/DRESSINGS) IMPLANT
DRAPE EXTREMITY T 121X128X90 (DISPOSABLE) ×1 IMPLANT
DRAPE ORTHO SPLIT 77X108 STRL (DRAPES) ×2
DRAPE POUCH INSTRU U-SHP 10X18 (DRAPES) ×2 IMPLANT
DRAPE SURG ORHT 6 SPLT 77X108 (DRAPES) ×2 IMPLANT
DRAPE U-SHAPE 47X51 STRL (DRAPES) ×2 IMPLANT
DRSG PAD ABDOMINAL 8X10 ST (GAUZE/BANDAGES/DRESSINGS) ×1 IMPLANT
DURAPREP 26ML APPLICATOR (WOUND CARE) ×5 IMPLANT
ELECT CAUTERY BLADE 6.4 (BLADE) ×2 IMPLANT
ELECT REM PT RETURN 9FT ADLT (ELECTROSURGICAL) ×2
ELECTRODE REM PT RTRN 9FT ADLT (ELECTROSURGICAL) ×1 IMPLANT
EVACUATOR 1/8 PVC DRAIN (DRAIN) IMPLANT
GAUZE SPONGE 4X4 12PLY STRL (GAUZE/BANDAGES/DRESSINGS) ×1 IMPLANT
GLOVE BIO SURGEON STRL SZ 6.5 (GLOVE) ×2 IMPLANT
GLOVE BIO SURGEON STRL SZ7 (GLOVE) ×1 IMPLANT
GLOVE BIOGEL PI IND STRL 6.5 (GLOVE) IMPLANT
GLOVE BIOGEL PI IND STRL 7.0 (GLOVE) ×1 IMPLANT
GLOVE BIOGEL PI IND STRL 7.5 (GLOVE) IMPLANT
GLOVE BIOGEL PI INDICATOR 6.5 (GLOVE) ×1
GLOVE BIOGEL PI INDICATOR 7.0 (GLOVE) ×1
GLOVE BIOGEL PI INDICATOR 7.5 (GLOVE) ×1
GLOVE ECLIPSE 7.0 STRL STRAW (GLOVE) ×1 IMPLANT
GLOVE SKINSENSE NS SZ7.5 (GLOVE) ×2
GLOVE SKINSENSE STRL SZ7.5 (GLOVE) ×2 IMPLANT
GOWN STRL REIN XL XLG (GOWN DISPOSABLE) ×2 IMPLANT
GOWN STRL REUS W/ TWL LRG LVL3 (GOWN DISPOSABLE) IMPLANT
GOWN STRL REUS W/TWL LRG LVL3 (GOWN DISPOSABLE) ×1
HANDPIECE INTERPULSE COAX TIP (DISPOSABLE) ×1
HDLS TROCR DRIL PIN KNEE 75 (Miscellaneous) ×1 IMPLANT
HOOD PEEL AWAY FLYTE STAYCOOL (MISCELLANEOUS) ×5 IMPLANT
INSERT TIB ARTSURF SZ 6-7 16 (Joint) ×1 IMPLANT
K-WIRE SURGICAL 1.6X102 (WIRE) ×1 IMPLANT
KIT BASIN OR (CUSTOM PROCEDURE TRAY) ×2 IMPLANT
KIT TURNOVER KIT B (KITS) ×2 IMPLANT
MANIFOLD NEPTUNE II (INSTRUMENTS) ×2 IMPLANT
MARKER SKIN DUAL TIP RULER LAB (MISCELLANEOUS) ×2 IMPLANT
NDL SPNL 18GX3.5 QUINCKE PK (NEEDLE) ×1 IMPLANT
NEEDLE SPNL 18GX3.5 QUINCKE PK (NEEDLE) ×2 IMPLANT
NS IRRIG 1000ML POUR BTL (IV SOLUTION) ×2 IMPLANT
PACK TOTAL JOINT (CUSTOM PROCEDURE TRAY) ×2 IMPLANT
PAD ARMBOARD 7.5X6 YLW CONV (MISCELLANEOUS) ×4 IMPLANT
PAD CAST 4YDX4 CTTN HI CHSV (CAST SUPPLIES) IMPLANT
PADDING CAST COTTON 4X4 STRL (CAST SUPPLIES) ×1
PADDING CAST COTTON 6X4 STRL (CAST SUPPLIES) ×1 IMPLANT
PIN DRILL HDLS TROCAR 75 4PK (Miscellaneous) IMPLANT
SAW OSC TIP CART 19.5X105X1.3 (SAW) ×1 IMPLANT
SEALER BIPOLAR AQUA 6.0 (INSTRUMENTS) ×1 IMPLANT
SET HNDPC FAN SPRY TIP SCT (DISPOSABLE) IMPLANT
STEM POLY PAT PLY 32M KNEE (Knees) ×1 IMPLANT
STEM TIB ST PERS 14+30 (Stem) ×1 IMPLANT
STEM TIBIA 5 DEG SZ F L KNEE (Knees) IMPLANT
SUCTION FRAZIER HANDLE 10FR (MISCELLANEOUS) ×1
SUCTION TUBE FRAZIER 10FR DISP (MISCELLANEOUS) ×1 IMPLANT
SUT ETHILON 2 0 FS 18 (SUTURE) ×3 IMPLANT
SUT MNCRL AB 3-0 PS2 27 (SUTURE) ×2 IMPLANT
SUT PDS AB 1 CT  36 (SUTURE) ×2
SUT PDS AB 1 CT 36 (SUTURE) ×2 IMPLANT
SUT VIC AB 0 CT1 27 (SUTURE) ×2
SUT VIC AB 0 CT1 27XBRD ANBCTR (SUTURE) ×2 IMPLANT
SUT VIC AB 1 CTX 36 (SUTURE) ×1
SUT VIC AB 1 CTX36XBRD ANBCTR (SUTURE) ×3 IMPLANT
SUT VIC AB 2-0 CT1 27 (SUTURE) ×2
SUT VIC AB 2-0 CT1 TAPERPNT 27 (SUTURE) ×2 IMPLANT
SWAB COLLECTION DEVICE MRSA (MISCELLANEOUS) IMPLANT
SWAB CULTURE ESWAB REG 1ML (MISCELLANEOUS) ×2 IMPLANT
SYR 20ML LL LF (SYRINGE) ×2 IMPLANT
SYR 50ML LL SCALE MARK (SYRINGE) ×2 IMPLANT
TIBIA STEM 5 DEG SZ F L KNEE (Knees) ×2 IMPLANT
TOWEL GREEN STERILE (TOWEL DISPOSABLE) ×2 IMPLANT
TOWEL GREEN STERILE FF (TOWEL DISPOSABLE) ×2 IMPLANT
TRAY FOLEY MTR SLVR 16FR STAT (SET/KITS/TRAYS/PACK) IMPLANT

## 2019-01-29 NOTE — Evaluation (Signed)
Physical Therapy Evaluation Patient Details Name: Brandi Lutz MRN: [id number] DOB: 14-May-1976 Today's Date: 01/29/2019   History of Present Illness  Pt is a 43 year old female presenting s/p L TKR. Relevent PMH includes anxiety, depression, DM, and colon cancer. PSH includes L unicompartmental knee arthroplasty in 2016.  Clinical Impression  Pt presents with the above dx and the below deficits. Pt performed bed mobility at the min G to min A level. RN reports Pt was medicated with pain medications prior to therapy session. Pt was very groggy. Upon completing exercises, pt reported increasing feelings of nausea. Once nausea subsided, pt sat on EOB, but reported feeling flushed and like she might pass out. She was placed back in bed. BP was taken and was WNL (122/69.) RN in room to check blood glucose. Further mobility deferred due to pt symptoms. Pt will benefit from skilled acute physical therapy to promote functional independence and safety.    Follow Up Recommendations Follow surgeon's recommendation for DC plan and follow-up therapies    Equipment Recommendations  Rolling walker with 5" wheels;3in1 (PT)    Recommendations for Other Services       Precautions / Restrictions Precautions Precautions: Knee Precaution Booklet Issued: Yes (comment) Precaution Comments: reviewed knee precautions with pt Restrictions Weight Bearing Restrictions: Yes LLE Weight Bearing: Weight bearing as tolerated      Mobility  Bed Mobility Overal bed mobility: Needs Assistance Bed Mobility: Supine to Sit;Sit to Supine     Supine to sit: Min assist Sit to supine: Min assist   General bed mobility comments: Pt requires assistance with leg positioning and trunk elevation. Pt requires VCs to complete mobility. Eval limited to bed mobility tasks only due to Pt's increased reports of nausea. She also became flushed and felt like she might pass out. BP checked upon return to supine and was WNL  (122/69.) RN in room to check blood sugar as well.  Transfers                    Ambulation/Gait                Stairs            Wheelchair Mobility    Modified Rankin (Stroke Patients Only)       Balance Overall balance assessment: Needs assistance Sitting-balance support: Feet supported;No upper extremity supported Sitting balance-Leahy Scale: Fair                                       Pertinent Vitals/Pain Pain Assessment: Faces Faces Pain Scale: Hurts a little bit Pain Location: L knee Pain Descriptors / Indicators: Operative site guarding;Aching Pain Intervention(s): Limited activity within patient's tolerance;Monitored during session;Premedicated before session    Home Living Family/patient expects to be discharged to:: Private residence Living Arrangements: Parent Available Help at Discharge: Family;Available 24 hours/day(Mother will be able to provide 24 hr care for 1st week.) Type of Home: House Home Access: Level entry     Home Layout: Two level;Able to live on main level with bedroom/bathroom Home Equipment: Crutches      Prior Function Level of Independence: Independent with assistive device(s)         Comments: Pt able to perform mobility tasks independently, but notes increasing pain and time to complete tasks. As symptoms worsens, she uses a crutch for mobility tasks.     Hand Dominance  Extremity/Trunk Assessment   Upper Extremity Assessment Upper Extremity Assessment: Overall WFL for tasks assessed    Lower Extremity Assessment Lower Extremity Assessment: Generalized weakness;LLE deficits/detail LLE Deficits / Details: deficits noted consistent with post operative pain and weakness. Pt reported tingling in toes.    Cervical / Trunk Assessment Cervical / Trunk Assessment: Normal  Communication   Communication: No difficulties  Cognition Arousal/Alertness: Lethargic;Suspect due to  medications Behavior During Therapy: [facility] for tasks assessed/performed Overall Cognitive Status: Within Functional Limits for tasks assessed                                 General Comments: Pt asleep upon entry and kept eyes closed for majority of session likely due to pain medications received prior.      General Comments      Exercises     Assessment/Plan    PT Assessment Patient needs continued PT services  PT Problem List Decreased strength;Decreased range of motion;Decreased activity tolerance;Decreased balance;Decreased mobility;Decreased knowledge of use of DME;Pain       PT Treatment Interventions DME instruction;Gait training;Stair training;Functional mobility training;Therapeutic activities;Therapeutic exercise;Balance training;Patient/family education    PT Goals (Current goals can be found in the Care Plan section)  Acute Rehab PT Goals Patient Stated Goal: walk without pain PT Goal Formulation: With patient Time For Goal Achievement: 02/12/19 Potential to Achieve Goals: Good    Frequency 7X/week   Barriers to discharge        Co-evaluation               AM-PAC PT "6 Clicks" Mobility  Outcome Measure Help needed turning from your back to your side while in a flat bed without using bedrails?: A Little Help needed moving from lying on your back to sitting on the side of a flat bed without using bedrails?: A Little Help needed moving to and from a bed to a chair (including a wheelchair)?: A Lot Help needed standing up from a chair using your arms (e.g., wheelchair or bedside chair)?: A Lot Help needed to walk in hospital room?: A Lot Help needed climbing 3-5 steps with a railing? : A Lot 6 Click Score: 14    End of Session Equipment Utilized During Treatment: Gait belt Activity Tolerance: Patient limited by lethargy;Treatment limited secondary to medical complications (Comment)(Pt reported nausea and lightheadedness. ) Patient left: in  bed;with call bell/phone within reach;with SCD's reapplied Nurse Communication: Mobility status(request blood sugar check. report BP within normal range.) PT Visit Diagnosis: Pain;Unsteadiness on feet (R26.81);Muscle weakness (generalized) (M62.81);Difficulty in walking, not elsewhere classified (R26.2) Pain - Right/Left: Left Pain - part of body: Knee    Time: 1610-9604 PT Time Calculation (min) (ACUTE ONLY): 39 min   Charges:   PT Evaluation $PT Eval Low Complexity: 1 Low PT Treatments $Therapeutic Exercise: 8-22 mins $Therapeutic Activity: 8-22 mins        Christophe Louis, SPT  Christophe Louis 01/29/2019, 6:48 PM

## 2019-01-29 NOTE — Progress Notes (Signed)
Orthopedic Tech Progress Note Patient Details:  Brandi Lutz [birth year] [id number]  CPM Left Knee CPM Left Knee: On Left Knee Flexion (Degrees): 90 Left Knee Extension (Degrees): 0 Additional Comments: added ice  Post Interventions Patient Tolerated: Well Instructions Provided: Care of device, Adjustment of device  [name] 01/29/2019, 11:08 AM

## 2019-01-29 NOTE — Progress Notes (Signed)
Physical Therapist at the bedside with the patient- the patient reported feeling like she might be having a hypoglycemic episode.  Finger blood glucose done with results of 162.  Vital signs WNL.  Patient is alert but sweating, episode possibly due to pain medications and getting out of bed for the first time.  The patient was positioned back in the bed.

## 2019-01-29 NOTE — Anesthesia Procedure Notes (Signed)

## 2019-01-29 NOTE — Anesthesia Postprocedure Evaluation (Signed)
Anesthesia Post Note  Patient: Jillyan A Curtner  Procedure(s) Performed: CONVERSION OF LEFT UNICOMPARTMENTAL KNEE ARTHROPLASTY TO LEFT TOTAL KNEE ARTHROPLASTY (Left Knee)     Patient location during evaluation: PACU Anesthesia Type: Spinal Level of consciousness: oriented and awake and alert Pain management: pain level controlled Vital Signs Assessment: post-procedure vital signs reviewed and stable Respiratory status: spontaneous breathing, respiratory function stable and nonlabored ventilation Cardiovascular status: blood pressure returned to baseline and stable Postop Assessment: no headache, no backache, no apparent nausea or vomiting and spinal receding Anesthetic complications: no    Last Vitals:  Vitals:   01/29/19 1100 01/29/19 1116  BP: 118/66 105/76  Pulse: 81 84  Resp: 16 16  Temp:  36.5 C  SpO2: 99% 100%    Last Pain:  Vitals:   01/29/19 1116  TempSrc: Oral  PainSc:                  Lidia Collum

## 2019-01-29 NOTE — Plan of Care (Signed)
  Problem: Education: Goal: Knowledge of General Education information will improve Description Including pain rating scale, medication(s)/side effects and non-pharmacologic comfort measures Outcome: Progressing   

## 2019-01-29 NOTE — H&P (Signed)
PREOPERATIVE H&P  Chief Complaint: left knee degenerative joint disease  HPI: Brandi Lutz is a 43 y.o. female who presents for surgical treatment of left knee degenerative joint disease.  She denies any changes in medical history.  Past Medical History:  Diagnosis Date  . Anxiety   . Arthritis   . Cancer ([place])    colon  . Depression    takes Wellbutrin and Zoloft daily  . Diabetes mellitus without complication ([place])    diet and gastric surgery controlled  . Gastric bypass status for obesity 02/05/2013  . Headache   . History of colon polyps    benign  . History of migraine    none since high school  . Joint pain   . Joint swelling   . Other specified iron deficiency anemias 02/05/2013   2 iron infusions since gastric bypass;no abnormal reaction noted  . PONV (postoperative nausea and vomiting)   . 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 APPENDECTOMY    . ARTHROSCOPIC REPAIR ACL Left 20 yrs ago  . CHOLECYSTECTOMY    . COLECTOMY    . COLONOSCOPY    . GASTRIC BYPASS    . herniated bowel repair    . KNEE ARTHROSCOPY Left   . PARTIAL KNEE ARTHROPLASTY Left 08/05/2014   Procedure: LEFT UNICOMPARTMENTAL KNEE;  Surgeon: Marianna Payment, MD;  Location: [place];  Service: Orthopedics;  Laterality: Left;   Social History   Socioeconomic History  . Marital status: Single    Spouse name: Not on file  . Number of children: Not on file  . Years of education: Not on file  . Highest education level: Not on file  Occupational History  . Not on file  Social Needs  . Financial resource strain: Not on file  . Food insecurity    Worry: Not on file    Inability: Not on file  . Transportation needs    Medical: Not on file    Non-medical: Not on file  Tobacco Use  . Smoking status: Never Smoker  . Smokeless tobacco: Never Used  Substance and Sexual Activity  . Alcohol use: Yes    Alcohol/week: 0.0  standard drinks    Comment: rarely  . Drug use: No  . Sexual activity: Not Currently  Lifestyle  . Physical activity    Days per week: Not on file    Minutes per session: Not on file  . Stress: Not on file  Relationships  . Social Herbalist on phone: Not on file    Gets together: Not on file    Attends religious service: Not on file    Active member of club or organization: Not on file    Attends meetings of clubs or organizations: Not on file    Relationship status: Not on file  Other Topics Concern  . Not on file  Social History Narrative  . Not on file   History reviewed. No pertinent family history. Allergies  Allergen Reactions  . Tape Rash   Prior to Admission medications   Medication Sig Start Date End Date Taking? Authorizing Provider  acetaminophen (TYLENOL) 500 MG tablet Take 1,000 mg by mouth every 8 (eight) hours as needed for mild pain or moderate pain.   Yes [provider]  buPROPion (WELLBUTRIN XL) 150 MG 24 hr tablet Take 150 mg by mouth daily.   Yes [provider]  diphenhydrAMINE (SOMINEX) 25 MG tablet Take 25 mg by mouth at bedtime as needed for sleep.   Yes [provider]  Multiple Vitamin (MULTIVITAMIN WITH MINERALS) TABS tablet Take 2 tablets by mouth daily.   Yes [provider]  sertraline (ZOLOFT) 50 MG tablet Take 50 mg by mouth daily.   Yes [provider]  traMADol (ULTRAM) 50 MG tablet Take 50 mg by mouth daily as needed for moderate pain.   Yes [provider]  clindamycin-tretinoin Pershing Proud) gel Apply 1 application topically daily as needed (acne).    [provider]  phentermine (ADIPEX-P) 37.5 MG tablet Take 37.5 mg by mouth daily. 08/25/18   [provider]     Positive ROS: All other systems have been reviewed and were otherwise negative with the exception of those mentioned in the HPI and as above.  Physical Exam: General: Alert, no acute distress  Cardiovascular: No pedal edema Respiratory: No cyanosis, no use of accessory musculature GI: abdomen soft Skin: No lesions in the area of chief complaint Neurologic: Sensation intact distally Psychiatric: Patient is competent for consent with normal mood and affect Lymphatic: no lymphedema  MUSCULOSKELETAL: exam stable  Assessment: left knee degenerative joint disease  Plan: Plan for Procedure(s): CONVERSION OF LEFT UNICOMPARTMENTAL KNEE ARTHROPLASTY TO LEFT TOTAL KNEE ARTHROPLASTY  The risks benefits and alternatives were discussed with the patient including but not limited to the risks of nonoperative treatment, versus surgical intervention including infection, bleeding, nerve injury,  blood clots, cardiopulmonary complications, morbidity, mortality, among others, and they were willing to proceed.   Preoperative templating of the joint replacement has been completed, documented, and submitted to the Operating Room personnel in order to optimize intra-operative equipment management.  Patient's anticipated LOS is less than 2 midnights, meeting these requirements: - Younger than 24 - Lives within 1 hour of care - Has a competent adult at home to recover with post-op recover - NO history of  - Chronic pain requiring opiods  - Diabetes  - Coronary Artery Disease  - Heart failure  - Heart attack  - Stroke  - DVT/VTE  - Cardiac arrhythmia  - Respiratory Failure/COPD  - Renal failure  - Anemia  - Advanced Liver disease  Eduard Roux, MD   01/29/2019 6:57 AM

## 2019-01-29 NOTE — Discharge Instructions (Signed)

## 2019-01-29 NOTE — Anesthesia Procedure Notes (Signed)
Anesthesia Regional Block: Adductor canal block   Pre-Anesthetic Checklist: ,, timeout performed, Correct Patient, Correct Site, Correct Laterality, Correct Procedure, Correct Position, site marked, Risks and benefits discussed,  Surgical consent,  Pre-op evaluation,  At surgeon's request and post-op pain management  Laterality: Left  Prep: chloraprep       Needles:  Injection technique: Single-shot  Needle Type: Echogenic Stimulator Needle     Needle Length: 9cm  Needle Gauge: 21     Additional Needles:   Procedures:,,,, ultrasound used (permanent image in chart),,,,  Narrative:  Start time: 01/29/2019 6:55 AM End time: 01/29/2019 6:59 AM Injection made incrementally with aspirations every 5 mL.  Performed by: Personally  Anesthesiologist: Lidia Collum, MD  Additional Notes: Monitors applied. Injection made in 5cc increments. No resistance to injection. Good needle visualization. Patient tolerated procedure well.

## 2019-01-29 NOTE — Transfer of Care (Signed)
Immediate Anesthesia Transfer of Care Note  Patient: Brandi Lutz  Procedure(s) Performed: CONVERSION OF LEFT UNICOMPARTMENTAL KNEE ARTHROPLASTY TO LEFT TOTAL KNEE ARTHROPLASTY (Left Knee)  Patient Location: PACU  Anesthesia Type:MAC and Spinal  Level of Consciousness: awake, alert  and oriented  Airway & Oxygen Therapy: Patient Spontanous Breathing and Patient connected to face mask oxygen  Post-op Assessment: Report given to RN and Post -op Vital signs reviewed and stable  Post vital signs: Reviewed and stable  Last Vitals:  Vitals Value Taken Time  BP 106/60 01/29/19 1030  Temp    Pulse 97 01/29/19 1035  Resp 15 01/29/19 1035  SpO2 100 % 01/29/19 1035  Vitals shown include unvalidated device data.  Last Pain:  Vitals:   01/29/19 0614  TempSrc:   PainSc: 0-No pain         Complications: No apparent anesthesia complications

## 2019-01-29 NOTE — Anesthesia Procedure Notes (Signed)
Procedure Name: MAC Date/Time: 01/29/2019 7:42 AM Performed by: Candis Shine, CRNA Pre-anesthesia Checklist: Patient identified, Emergency Drugs available, Suction available, Patient being monitored and Timeout performed Patient Re-evaluated:Patient Re-evaluated prior to induction Oxygen Delivery Method: Simple face mask Dental Injury: Teeth and Oropharynx as per pre-operative assessment

## 2019-01-29 NOTE — Op Note (Addendum)
Date of Surgery: 01/29/2019  INDICATIONS: Ms. Dahlem is a 43 y.o.-year-old female with left knee degenerative joint disease;  The patient did consent to the procedure after discussion of the risks and benefits.  PREOPERATIVE DIAGNOSIS:  1.  Status post left unicompartmental knee arthroplasty 2.  Progression of left knee patellofemoral and lateral compartment degenerative joint disease 3.  Retained metallic interference screw left tibia  POSTOPERATIVE DIAGNOSIS: Same.  PROCEDURE:  1.  Removal of left unicompartmental knee arthroplasty 2.  Removal of retained metallic interference screw from left proximal tibia 3.  Revision of left unicompartmental knee arthroplasty to left total knee arthroplasty  SURGEON: N. Eduard Roux, M.D.  ASSIST: April Green, RNFA  ANESTHESIA:  spinal, abductor canal block, local  IV FLUIDS AND URINE: See anesthesia.  ESTIMATED BLOOD LOSS: Minimal mL.  IMPLANTS:  Zimmer persona Femur: CR size 7 Tibia: Size F with 30 mm stem Patella: 32 mm Polyethylene: 16 mm medial congruent  DRAINS: None  COMPLICATIONS: see description of procedure.  DESCRIPTION OF PROCEDURE: The patient was brought to the operating room and placed supine on the operating table.  The patient had been signed prior to the procedure and this was documented. The patient had the anesthesia placed by the anesthesiologist.  A time-out was performed to confirm that this was the correct patient, site, side and location. The patient did receive antibiotics prior to the incision and was re-dosed during the procedure as needed at indicated intervals.  A tourniquet was placed on the upper left thigh.  The patient had the operative extremity prepped and draped in the standard surgical fashion.  An incision was made around the previous surgical scar and was extended proximally.  The previous scar was removed entirely in a full-thickness fashion.  Dissection was carried down through the subcutaneous  tissue and full-thickness flaps were elevated off of the knee joint.  A standard medial parapatellar arthrotomy was performed.  Infrapatellar fat pad removed.  Synovium in the suprapatellar pouch was excised.  Medial peel was performed.  The knee had demonstrated progression of her patellofemoral and lateral compartment DJD.  There was no evidence of infection or loosening of the partial knee arthroplasty.  The patella was everted and prepared with an oscillating saw and a 32 mm trial patella was placed.  The knee was then placed into flexion and retractors were placed.  The cruciates were sacrificed.  The polyethylene liner was removed without difficulty.  The femoral canal was then opened with a starting drill.  The distal femoral cutting guide was then placed taking 10 mm of distal femur at 5 degrees of valgus.  The cutting block was then pinned in place.  Angel wing was used to check the level of resection.  The lateral femoral condyle was cut.  I then used 1/4 inch osteotome to gently remove the femoral component which was done without difficulty.  There was no bone loss associated with the removal.  An oscillating saw was then used to freshen the remaining bone on the medial femoral condyle.  The cutting guide was then removed and attention was turned to the tibia.  With the knee in deep flexion retractors were placed and the tibial component was again removed using 1/4 inch osteotome without difficulty and without bone loss.  The tibia was then cut after using the extra medullary cutting guide set to the appropriate depth of resection and varus valgus and slope.  She did have a retained metallic interference screw in the proximal  tibia from her previous ACL reconstruction.  I remove this screw by first placing a K wire down the cannulated portion and then I drilled over the medial cortex with a cannulated drill in order to find the screw head.  The screw was then carefully advanced through the tibial cut  surface using a cannulated screw.  I gently tamped the screw out of the tibia for the last portion.  There was minimal bone loss related to this step.  I then used a precision saw to finish preparing the tibial cut.  The knee was brought out into extension and a gap balancer was used to assess the extension gap to approximately 16 mm.  The femoral pins were then removed.  Attention was then turned back to the femur.  Using the epicondylar axis and Whitesides line rotation of the femur was determined.  The femur was sized to 7.  The 4-in-1 cutting block was then placed and the appropriate cuts were made.  Lamina spreader was then placed in order to provide visualization for removal of any loose bodies or osteophytes from the back of the joint.  The femoral trial was then placed along with the tibial trial with a 16 mm polyethylene trial.  Stability was checked in full extension, mid flexion, full flexion.  The trial components were then removed.  Tibial preparation was then completed and sized to F.  The trial components were then removed osteophytes were also removed.  The knee joint was then thoroughly irrigated and a dilute solution of Exparel and saline was injected around the capsule and quadriceps tendon.  The bony surfaces were then dried and the final components were cemented.   The final polyethylene liner was impacted onto the tibial tray.  The knee joint was again thoroughly irrigated.  Vancomycin powder was placed in the knee joint.  The arthrotomy was closed with interrupted #1 Vicryl.  Subcutaneous fat closed with 0 Vicryl.  Layered closure was performed.  Sterile dressings were applied.  Patient tolerated procedure well had no immediate complications.  POSTOPERATIVE PLAN: Patient will be admitted to the orthopedic unit to undergo mobilization with PT.  Azucena Cecil, MD 9:58 AM

## 2019-01-29 NOTE — Plan of Care (Signed)

## 2019-01-30 ENCOUNTER — Encounter (HOSPITAL_COMMUNITY): Payer: Self-pay | Admitting: General Practice

## 2019-01-30 LAB — CBC
HCT: 35.6 % — ABNORMAL LOW (ref 36.0–46.0)
Hemoglobin: 11.4 g/dL — ABNORMAL LOW (ref 12.0–15.0)
MCH: 26.8 pg (ref 26.0–34.0)
MCHC: 32 g/dL (ref 30.0–36.0)
MCV: 83.6 fL (ref 80.0–100.0)
Platelets: 219 10*3/uL (ref 150–400)
RBC: 4.26 MIL/uL (ref 3.87–5.11)
RDW: 14.8 % (ref 11.5–15.5)
WBC: 5 10*3/uL (ref 4.0–10.5)
nRBC: 0 % (ref 0.0–0.2)

## 2019-01-30 LAB — BASIC METABOLIC PANEL
Anion gap: 5 (ref 5–15)
BUN: 7 mg/dL (ref 6–20)
CO2: 28 mmol/L (ref 22–32)
Calcium: 8.2 mg/dL — ABNORMAL LOW (ref 8.9–10.3)
Chloride: 107 mmol/L (ref 98–111)
Creatinine, Ser: 0.9 mg/dL (ref 0.44–1.00)
GFR calc Af Amer: >60 mL/min (ref >60)
GFR calc non Af Amer: >60 mL/min (ref >60)
Glucose, Bld: 100 mg/dL — ABNORMAL HIGH (ref 70–99)
Potassium: 4.1 mmol/L (ref 3.5–5.1)
Sodium: 140 mmol/L (ref 135–145)

## 2019-01-30 NOTE — Progress Notes (Signed)
Physical Therapy Treatment Patient Details Name: Brandi Lutz MRN: [id number] DOB: 06/01/1976 Today's Date: 01/30/2019    History of Present Illness Pt is a 43 year old female presenting s/p L TKR. Relevent PMH includes anxiety, depression, DM, and colon cancer. PSH includes L unicompartmental knee arthroplasty in 2016.    PT Comments    Pt performed gt training and functional mobility during session this pm.  She progressed gt distance and performed step through pattern.  Mother present and pleased with her progress.  Pt performed supine LE exercises and CPM donned post session as she was lacking flexion this pm.  Will plan for stair training in am.      Follow Up Recommendations  Follow surgeon's recommendation for DC plan and follow-up therapies     Equipment Recommendations  Rolling walker with 5" wheels;3in1 (PT)    Recommendations for Other Services       Precautions / Restrictions Precautions Precautions: Knee Precaution Booklet Issued: Yes (comment) Precaution Comments: reviewed knee precautions with pt Restrictions Weight Bearing Restrictions: Yes LLE Weight Bearing: Weight bearing as tolerated    Mobility  Bed Mobility Overal bed mobility: Needs Assistance Bed Mobility: Supine to Sit;Sit to Supine     Supine to sit: Supervision Sit to supine: Min assist   General bed mobility comments: Increased time used R LE to advance LLE.  Required min assistance to return LLE back to bed.  Transfers Overall transfer level: Needs assistance Equipment used: Rolling walker (2 wheeled) Transfers: Sit to/from Stand Sit to Stand: Min assist         General transfer comment: Cues for hand placement to and from seated surface.  Ambulation/Gait Ambulation/Gait assistance: Min guard Gait Distance (Feet): 250 Feet Assistive device: Rolling walker (2 wheeled) Gait Pattern/deviations: Step-to pattern;Decreased weight shift to left;Trunk flexed;Antalgic;Step-through  pattern     General Gait Details: Pt able to progress to step through pattern and increase gt distance without any nausea.  She feels better this pm   Stairs             Wheelchair Mobility    Modified Rankin (Stroke Patients Only)       Balance Overall balance assessment: Needs assistance Sitting-balance support: Feet supported;No upper extremity supported Sitting balance-Leahy Scale: Fair       Standing balance-Leahy Scale: Poor                              Cognition Arousal/Alertness: Awake/alert Behavior During Therapy: WFL for tasks assessed/performed Overall Cognitive Status: Within Functional Limits for tasks assessed                                        Exercises Total Joint Exercises Ankle Circles/Pumps: AROM;Both;20 reps;Supine Quad Sets: AROM;Left;10 reps;Supine Hip ABduction/ADduction: AAROM;Left;10 reps;Supine Straight Leg Raises: AROM;Left;10 reps;Supine Long Arc Quad: AAROM;Left;10 reps;Seated    General Comments        Pertinent Vitals/Pain Pain Assessment: 0-10 Pain Score: 8  Pain Location: L knee Pain Descriptors / Indicators: Operative site guarding;Aching Pain Intervention(s): Monitored during session;Repositioned;Ice applied;RN gave pain meds during session;Patient requesting pain meds-RN notified    Home Living Family/patient expects to be discharged to:: Private residence Living Arrangements: Parent                  Prior Function  PT Goals (current goals can now be found in the care plan section) Acute Rehab PT Goals Patient Stated Goal: walk without pain PT Goal Formulation: With patient Potential to Achieve Goals: Good Progress towards PT goals: Progressing toward goals    Frequency    7X/week      PT Plan Current plan remains appropriate    Co-evaluation              AM-PAC PT "6 Clicks" Mobility   Outcome Measure  Help needed turning from your back to  your side while in a flat bed without using bedrails?: A Little Help needed moving from lying on your back to sitting on the side of a flat bed without using bedrails?: A Little Help needed moving to and from a bed to a chair (including a wheelchair)?: A Lot Help needed standing up from a chair using your arms (e.g., wheelchair or bedside chair)?: A Lot Help needed to walk in hospital room?: A Lot Help needed climbing 3-5 steps with a railing? : A Lot 6 Click Score: 14    End of Session Equipment Utilized During Treatment: Gait belt Activity Tolerance: Patient limited by lethargy;Treatment limited secondary to medical complications (Comment) Patient left: with call bell/phone within reach;in bed;in CPM;with nursing/sitter in room;with family/visitor present;with SCD's reapplied Nurse Communication: Mobility status PT Visit Diagnosis: Pain;Unsteadiness on feet (R26.81);Muscle weakness (generalized) (M62.81);Difficulty in walking, not elsewhere classified (R26.2) Pain - Right/Left: Left Pain - part of body: Knee     Time: 1351-1420 PT Time Calculation (min) (ACUTE ONLY): 29 min  Charges:  $Gait Training: 8-22 mins $Therapeutic Exercise: 8-22 mins                     Governor Rooks, PTA Acute Rehabilitation Services Pager [phone] Office [phone]     Brantleigh Mifflin Eli Hose 01/30/2019, 2:50 PM

## 2019-01-30 NOTE — Plan of Care (Signed)
  Problem: Clinical Measurements: Goal: Respiratory complications will improve Outcome: Progressing   Problem: Activity: Goal: Risk for activity intolerance will decrease Outcome: Progressing   Problem: Nutrition: Goal: Adequate nutrition will be maintained Outcome: Progressing   Problem: Elimination: Goal: Will not experience complications related to bowel motility Outcome: Progressing Goal: Will not experience complications related to urinary retention Outcome: Progressing   Problem: Pain Managment: Goal: General experience of comfort will improve Outcome: Progressing   Problem: Safety: Goal: Ability to remain free from injury will improve Outcome: Progressing   Problem: Skin Integrity: Goal: Risk for impaired skin integrity will decrease Outcome: Progressing   Problem: Education: Goal: Knowledge of the prescribed therapeutic regimen will improve Outcome: Progressing Goal: Individualized Educational Video(s) Outcome: Progressing   Problem: Activity: Goal: Ability to avoid complications of mobility impairment will improve Outcome: Progressing Goal: Range of joint motion will improve Outcome: Progressing   Problem: Pain Management: Goal: Pain level will decrease with appropriate interventions Outcome: Progressing   Problem: Skin Integrity: Goal: Will show signs of wound healing Outcome: Progressing

## 2019-01-30 NOTE — Progress Notes (Signed)
Orthopedic Tech Progress Note Patient Details:  Brandi Lutz [birth year] [id number]  CPM Left Knee CPM Left Knee: On Left Knee Flexion (Degrees): 60 Left Knee Extension (Degrees): 0 Additional Comments: (cold therapy)  Post Interventions Patient Tolerated: Well Instructions Provided: Care of device, Adjustment of device  [place] Pink 01/30/2019, 8:02 AM

## 2019-01-30 NOTE — Progress Notes (Signed)
   Subjective:  Patient reports pain as moderate.  No events.  Objective:   VITALS:   Vitals:   01/29/19 1444 01/29/19 2015 01/29/19 2339 01/30/19 0353  BP: (!) 97/55 110/70 115/65 (!) 97/59  Pulse: 91 87 79 81  Resp: 18 17 16 17   Temp: 98.3 F (36.8 C) 97.9 F (36.6 C) 97.7 F (36.5 C) 98 F (36.7 C)  TempSrc: Oral Oral Oral Oral  SpO2: 97% 100% 99% 100%  Weight:      Height:        Neurologically intact Neurovascular intact Sensation intact distally Intact pulses distally Dorsiflexion/Plantar flexion intact Incision: dressing C/D/I and no drainage No cellulitis present Compartment soft   Lab Results  Component Value Date   WBC 5.0 01/30/2019   HGB 11.4 (L) 01/30/2019   HCT 35.6 (L) 01/30/2019   MCV 83.6 01/30/2019   PLT 219 01/30/2019     Assessment/Plan:  1 Day Post-Op   - Expected postop acute blood loss anemia - will monitor for symptoms - Up with PT/OT - DVT ppx - SCDs, ambulation, aspirin - WBAT operative extremity - Pain control - Discharge planning - d/c home tomorrow to maximize PT today  Anticipated LOS equal to or greater than 2 midnights due to - Age 45 and older with one or more of the following:  - Obesity  - Expected need for hospital services (PT, OT, Nursing) required for safe  discharge  - Anticipated need for postoperative skilled nursing care or inpatient rehab  - Active co-morbidities: None   Eduard Roux 01/30/2019, 7:43 AM [phone]

## 2019-01-30 NOTE — Progress Notes (Signed)
Physical Therapy Treatment Patient Details Name: Brandi Lutz MRN: [id number] DOB: January 05, 1976 Today's Date: 01/30/2019    History of Present Illness Pt is a 43 year old female presenting s/p L TKR. Relevent PMH includes anxiety, depression, DM, and colon cancer. PSH includes L unicompartmental knee arthroplasty in 2016.    PT Comments    Pt performed gt training and functional mobility during session this am.  Pt continues to be limited due to wave of nausea.  Pt progressing slowly.  Will inform supervising PT to place order for OT during hospitalization.  Plan for pm f/u to progress mobility.    Follow Up Recommendations  Follow surgeon's recommendation for DC plan and follow-up therapies     Equipment Recommendations  Rolling walker with 5" wheels;3in1 (PT)    Recommendations for Other Services       Precautions / Restrictions Precautions Precautions: Knee Precaution Booklet Issued: Yes (comment) Precaution Comments: reviewed knee precautions with pt Restrictions Weight Bearing Restrictions: Yes LLE Weight Bearing: Weight bearing as tolerated    Mobility  Bed Mobility Overal bed mobility: Needs Assistance Bed Mobility: Supine to Sit;Sit to Supine     Supine to sit: Supervision     General bed mobility comments: Increased time used R LE to advance LLE.  Transfers Overall transfer level: Needs assistance Equipment used: Rolling walker (2 wheeled) Transfers: Sit to/from Stand Sit to Stand: Min assist         General transfer comment: Cues for hand placement to and from seated surface.  Ambulation/Gait Ambulation/Gait assistance: Min guard Gait Distance (Feet): 80 Feet Assistive device: Rolling walker (2 wheeled) Gait Pattern/deviations: Step-to pattern;Decreased weight shift to left;Trunk flexed;Antalgic     General Gait Details: Cues for RW sequencing and to use B UEs to offset weight to alleviate pain.  Pt required cues for upper trunk control,  required seated rest break due to wave of nausea during trial.   Stairs             Wheelchair Mobility    Modified Rankin (Stroke Patients Only)       Balance Overall balance assessment: Needs assistance Sitting-balance support: Feet supported;No upper extremity supported Sitting balance-Leahy Scale: Fair       Standing balance-Leahy Scale: Poor                              Cognition Arousal/Alertness: Awake/alert Behavior During Therapy: WFL for tasks assessed/performed Overall Cognitive Status: Within Functional Limits for tasks assessed                                 General Comments: nauseated      Exercises Total Joint Exercises Ankle Circles/Pumps: AROM;Both;20 reps;Supine Quad Sets: AROM;Left;10 reps;Supine Heel Slides: Left;10 reps;Supine;AAROM Hip ABduction/ADduction: AAROM;Left;10 reps;Supine Straight Leg Raises: AROM;Left;10 reps;Supine Goniometric ROM: 10-70 degrees.    General Comments        Pertinent Vitals/Pain Pain Assessment: 0-10 Pain Score: 8  Pain Location: L knee Pain Descriptors / Indicators: Operative site guarding;Aching Pain Intervention(s): Monitored during session;Repositioned    Home Living                      Prior Function            PT Goals (current goals can now be found in the care plan section) Acute Rehab PT Goals Patient Stated Goal: walk  without pain Potential to Achieve Goals: Good Progress towards PT goals: Progressing toward goals    Frequency    7X/week      PT Plan Current plan remains appropriate    Co-evaluation              AM-PAC PT "6 Clicks" Mobility   Outcome Measure  Help needed turning from your back to your side while in a flat bed without using bedrails?: A Little Help needed moving from lying on your back to sitting on the side of a flat bed without using bedrails?: A Little Help needed moving to and from a bed to a chair (including  a wheelchair)?: A Lot Help needed standing up from a chair using your arms (e.g., wheelchair or bedside chair)?: A Lot Help needed to walk in hospital room?: A Lot Help needed climbing 3-5 steps with a railing? : A Lot 6 Click Score: 14    End of Session Equipment Utilized During Treatment: Gait belt Activity Tolerance: Patient limited by lethargy;Treatment limited secondary to medical complications (Comment) Patient left: with call bell/phone within reach(left sitting on commode with instruction to pull cord when toileting is completed.  Nurse and NT informed.) Nurse Communication: Mobility status PT Visit Diagnosis: Pain;Unsteadiness on feet (R26.81);Muscle weakness (generalized) (M62.81);Difficulty in walking, not elsewhere classified (R26.2) Pain - Right/Left: Left Pain - part of body: Knee     Time: 1010-1029 PT Time Calculation (min) (ACUTE ONLY): 19 min  Charges:  $Gait Training: 8-22 mins $Therapeutic Exercise: 8-22 mins                     Governor Rooks, PTA Acute Rehabilitation Services Pager [phone] Office [phone]     Tamari Redwine Eli Hose 01/30/2019, 10:48 AM

## 2019-01-31 ENCOUNTER — Telehealth: Payer: Self-pay | Admitting: Family

## 2019-01-31 ENCOUNTER — Inpatient Hospital Stay: Payer: Managed Care, Other (non HMO)

## 2019-01-31 ENCOUNTER — Inpatient Hospital Stay: Payer: Managed Care, Other (non HMO) | Admitting: Family

## 2019-01-31 ENCOUNTER — Telehealth: Payer: Self-pay

## 2019-01-31 NOTE — Progress Notes (Signed)
Subjective: 2 Days Post-Op Procedure(s) (LRB): CONVERSION OF LEFT UNICOMPARTMENTAL KNEE ARTHROPLASTY TO LEFT TOTAL KNEE ARTHROPLASTY (Left) Patient reports pain as mild.  Doing well this am  Objective: Vital signs in last 24 hours: Temp:  [97.7 F (36.5 C)-98.4 F (36.9 C)] 98.3 F (36.8 C) (08/19 0328) Pulse Rate:  [77-89] 82 (08/19 0328) Resp:  [16-18] 16 (08/19 0328) BP: (109-129)/(66-83) 125/82 (08/19 0328) SpO2:  [98 %-100 %] 100 % (08/19 0328)  Intake/Output from previous day: 08/18 0701 - 08/19 0700 In: 1494.4 [P.O.:1080; I.V.:414.4] Out: -  Intake/Output this shift: No intake/output data recorded.  Recent Labs    01/30/19 0310  HGB 11.4*   Recent Labs    01/30/19 0310  WBC 5.0  RBC 4.26  HCT 35.6*  PLT 219   Recent Labs    01/30/19 0310  NA 140  K 4.1  CL 107  CO2 28  BUN 7  CREATININE 0.90  GLUCOSE 100*  CALCIUM 8.2*   No results for input(s): LABPT, INR in the last 72 hours.  Neurologically intact Neurovascular intact Sensation intact distally Intact pulses distally Dorsiflexion/Plantar flexion intact Incision: dressing C/D/I No cellulitis present Compartment soft   Assessment/Plan: 2 Days Post-Op Procedure(s) (LRB): CONVERSION OF LEFT UNICOMPARTMENTAL KNEE ARTHROPLASTY TO LEFT TOTAL KNEE ARTHROPLASTY (Left) Advance diet Up with therapy D/C IV fluids Discharge home with home health after first PT session WBAT LLE Dressing changed by me today Please apply ted hose to LLE    Anticipated LOS equal to or greater than 2 midnights due to - Age 45 and older with one or more of the following:  - Obesity  - Expected need for hospital services (PT, OT, Nursing) required for safe  discharge  - Anticipated need for postoperative skilled nursing care or inpatient rehab  - Active co-morbidities: None OR   - Unanticipated findings during/Post Surgery: Slow post-op progression: GI, pain control, mobility  - Patient is a high risk of  re-admission due to: None    Aundra Dubin 01/31/2019, 8:15 AM

## 2019-01-31 NOTE — Telephone Encounter (Signed)
Yes that would be great.  Please send in order.  Thanks.

## 2019-01-31 NOTE — Evaluation (Signed)
Occupational Therapy Evaluation Patient Details Name: Brandi Lutz MRN: [id number] DOB: 1975-09-01 Today's Date: 01/31/2019    History of Present Illness Pt is a 43 year old female presenting s/p L TKR. Relevent PMH includes anxiety, depression, DM, and colon cancer. PSH includes L unicompartmental knee arthroplasty in 2016.   Clinical Impression   Pt was seen for a one time evaluation.  She has had previous knee surgeries, including uni-knee and is doing well.  She will need a little assistance with L sock and shoe at this time.  She feels comfortable with bathroom transfers and will have initial 24/7.  Pt verbalizes understanding of safety and knee precautions.  No further OT is needed at this tim    Follow Up Recommendations  No OT follow up    Equipment Recommendations  3 in 1 bedside commode    Recommendations for Other Services       Precautions / Restrictions Precautions Precautions: Knee Precaution Booklet Issued: Yes (comment) Precaution Comments: reviewed knee precautions with pt Restrictions LLE Weight Bearing: Weight bearing as tolerated      Mobility Bed Mobility         Supine to sit: Supervision Sit to supine: Supervision   General bed mobility comments: pt used arms to self assist LLE  Transfers   Equipment used: Rolling walker (2 wheeled)   Sit to Stand: Supervision              Balance                                           ADL either performed or assessed with clinical judgement   ADL Overall ADL's : Needs assistance/impaired                                       General ADL Comments: pt can perform most of ADL with set up.  Min A for socks. Donned underwear during session. She had just used high commode and will need a 3:1 commode over her standard toilet.  Educated on/demonstrated backing into shower and coming out forward. Pt verbalizes understanding and did not feel she needed to practice  this. Pt is moving well.  Reinforced knee precautions and safety--not getting overly confident or overdoing it     Vision         Perception     Praxis      Pertinent Vitals/Pain Faces Pain Scale: Hurts a little bit Pain Location: L knee Pain Descriptors / Indicators: Operative site guarding;Aching Pain Intervention(s): Limited activity within patient's tolerance;Monitored during session;Repositioned     Hand Dominance Right   Extremity/Trunk Assessment             Communication     Cognition Arousal/Alertness: Awake/alert Behavior During Therapy: WFL for tasks assessed/performed Overall Cognitive Status: Within Functional Limits for tasks assessed                                     General Comments       Exercises     Shoulder Instructions      Home Living Family/patient expects to be discharged to:: Private residence Living Arrangements: Parent Available Help at Discharge: Family;Available 24 hours/day  Bathroom Shower/Tub: Occupational psychologist: Standard         Additional Comments: small stand up shower      Prior Functioning/Environment Level of Independence: Independent with assistive device(s)        Comments: Pt able to perform mobility tasks independently, but notes increasing pain and time to complete tasks. As symptoms worsens, she uses a crutch for mobility tasks.        OT Problem List:        OT Treatment/Interventions:      OT Goals(Current goals can be found in the care plan section) Acute Rehab OT Goals Patient Stated Goal: walk without pain OT Goal Formulation: All assessment and education complete, DC therapy  OT Frequency:     Barriers to D/C:            Co-evaluation              AM-PAC OT "6 Clicks" Daily Activity     Outcome Measure Help from another person eating meals?: None Help from another person taking care of personal grooming?: A Little Help from  another person toileting, which includes using toliet, bedpan, or urinal?: A Little Help from another person bathing (including washing, rinsing, drying)?: A Little Help from another person to put on and taking off regular upper body clothing?: A Little Help from another person to put on and taking off regular lower body clothing?: A Little 6 Click Score: 19   End of Session    Activity Tolerance: Patient tolerated treatment well Patient left: in bed;with call bell/phone within reach  OT Visit Diagnosis: Pain Pain - Right/Left: Left Pain - part of body: Knee                Time: 6553-7482 OT Time Calculation (min): 18 min Charges:  OT General Charges $OT Visit: 1 Visit OT Evaluation $OT Eval Low Complexity: 1 Low  Lesle Chris, OTR/L Acute Rehabilitation Services [phone] [facility] pager [phone] office 01/31/2019  [place] 01/31/2019, 9:50 AM

## 2019-01-31 NOTE — Discharge Summary (Signed)
Patient ID: Brandi Lutz MRN: [id number] DOB/AGE: November 30, 1975 43 y.o.  Admit date: 01/29/2019 Discharge date: 01/31/2019  Admission Diagnoses:  Principal Problem:   Primary osteoarthritis of left knee Active Problems:   Status post total left knee replacement   Discharge Diagnoses:  Same  Past Medical History:  Diagnosis Date  . Anxiety   . Arthritis   . Cancer ([place])    colon  . Depression    takes Wellbutrin and Zoloft daily  . Diabetes mellitus without complication ([place])    diet and gastric surgery controlled  . Gastric bypass status for obesity 02/05/2013  . Headache   . History of colon polyps    benign  . History of migraine    none since high school  . Joint pain   . Joint swelling   . Other specified iron deficiency anemias 02/05/2013   2 iron infusions since gastric bypass;no abnormal reaction noted  . PONV (postoperative nausea and vomiting)   . S/P left unicompartmental knee replacement 08/05/2014    Surgeries: Procedure(s): CONVERSION OF LEFT UNICOMPARTMENTAL KNEE ARTHROPLASTY TO LEFT TOTAL KNEE ARTHROPLASTY on 01/29/2019   Consultants:   Discharged Condition: Improved  Hospital Course: Brandi Lutz is an 43 y.o. female who was admitted 01/29/2019 for operative treatment ofPrimary osteoarthritis of left knee. Patient has severe unremitting pain that affects sleep, daily activities, and work/hobbies. After pre-op clearance the patient was taken to the operating room on 01/29/2019 and underwent  Procedure(s): CONVERSION OF LEFT UNICOMPARTMENTAL KNEE ARTHROPLASTY TO LEFT TOTAL KNEE ARTHROPLASTY.    Patient was given perioperative antibiotics:  Anti-infectives (From admission, onward)   Start     Dose/Rate Route Frequency Ordered Stop   01/29/19 1400  ceFAZolin (ANCEF) IVPB 2g/100 mL premix     2 g 200 mL/hr over 30 Minutes Intravenous Every 6 hours 01/29/19 1154 01/30/19 0153   01/29/19 0925  vancomycin (VANCOCIN) powder  Status:  Discontinued       As  needed 01/29/19 0926 01/29/19 1025   01/29/19 0615  ceFAZolin (ANCEF) IVPB 2g/100 mL premix     2 g 200 mL/hr over 30 Minutes Intravenous On call to O.R. 01/29/19 0600 01/29/19 0742   01/29/19 0000  sulfamethoxazole-trimethoprim (BACTRIM DS) 800-160 MG tablet     1 tablet Oral 2 times daily 01/29/19 0700         Patient was given sequential compression devices, early ambulation, and chemoprophylaxis to prevent DVT.  Patient benefited maximally from hospital stay and there were no complications.    Recent vital signs:  Patient Vitals for the past 24 hrs:  BP Temp Temp src Pulse Resp SpO2  01/31/19 0328 125/82 98.3 F (36.8 C) Oral 82 16 100 %  01/30/19 2016 129/79 98.4 F (36.9 C) Oral 85 17 100 %  01/30/19 1820 121/83 97.7 F (36.5 C) Oral 89 - 100 %  01/30/19 1201 121/77 98 F (36.7 C) Oral 77 - 98 %  01/30/19 0827 109/66 98.4 F (36.9 C) Oral 87 18 100 %     Recent laboratory studies:  Recent Labs    01/30/19 0310  WBC 5.0  HGB 11.4*  HCT 35.6*  PLT 219  NA 140  K 4.1  CL 107  CO2 28  BUN 7  CREATININE 0.90  GLUCOSE 100*  CALCIUM 8.2*     Discharge Medications:   Allergies as of 01/31/2019      Reactions   Tape Rash      Medication List  STOP taking these medications   acetaminophen 500 MG tablet Commonly known as: TYLENOL   phentermine 37.5 MG tablet Commonly known as: ADIPEX-P   traMADol 50 MG tablet Commonly known as: ULTRAM     TAKE these medications   aspirin EC 81 MG tablet Take 1 tablet (81 mg total) by mouth 2 (two) times daily.   buPROPion 150 MG 24 hr tablet Commonly known as: WELLBUTRIN XL Take 150 mg by mouth daily.   clindamycin-tretinoin gel Commonly known as: ZIANA Apply 1 application topically daily as needed (acne).   diphenhydrAMINE 25 MG tablet Commonly known as: SOMINEX Take 25 mg by mouth at bedtime as needed for sleep.   methocarbamol 750 MG tablet Commonly known as: ROBAXIN Take 1 tablet (750 mg total) by  mouth 2 (two) times daily as needed for muscle spasms.   multivitamin with minerals Tabs tablet Take 2 tablets by mouth daily.   ondansetron 4 MG tablet Commonly known as: ZOFRAN Take 1-2 tablets (4-8 mg total) by mouth every 8 (eight) hours as needed for nausea or vomiting.   oxyCODONE 5 MG immediate release tablet Commonly known as: Oxy IR/ROXICODONE Take 1-2 tablets (5-10 mg total) by mouth every 8 (eight) hours as needed for severe pain.   oxyCODONE 10 mg 12 hr tablet Commonly known as: OXYCONTIN Take 1 tablet (10 mg total) by mouth every 12 (twelve) hours for 3 days.   promethazine 25 MG tablet Commonly known as: PHENERGAN Take 1 tablet (25 mg total) by mouth every 6 (six) hours as needed for nausea.   senna-docusate 8.6-50 MG tablet Commonly known as: Senokot S Take 1-2 tablets by mouth at bedtime as needed.   sertraline 50 MG tablet Commonly known as: ZOLOFT Take 50 mg by mouth daily.   sulfamethoxazole-trimethoprim 800-160 MG tablet Commonly known as: BACTRIM DS Take 1 tablet by mouth 2 (two) times daily.            Durable Medical Equipment  (From admission, onward)         Start     Ordered   01/29/19 1155  DME Walker rolling  Once    Question:  Patient needs a walker to treat with the following condition  Answer:  Total knee replacement status   01/29/19 1154   01/29/19 1155  DME 3 n 1  Once     01/29/19 1154   01/29/19 1155  DME Bedside commode  Once    Question:  Patient needs a bedside commode to treat with the following condition  Answer:  Total knee replacement status   01/29/19 1154          Diagnostic Studies: Dg Chest 2 View  Result Date: 01/26/2019 CLINICAL DATA:  Preoperative total knee replacement EXAM: CHEST - 2 VIEW COMPARISON:  None. FINDINGS: The lungs are clear. Heart size and pulmonary vascularity are normal. No adenopathy. No bone lesions. IMPRESSION: No abnormality noted. Electronically Signed   By: Lowella Grip III M.D.    On: 01/26/2019 13:42   Dg Knee Left Port  Result Date: 01/29/2019 CLINICAL DATA:  Status post left knee replacement today. EXAM: PORTABLE LEFT KNEE - 1-2 VIEW COMPARISON:  Plain films left knee 01/02/2019. FINDINGS: New total knee arthroplasty is in place. The device is located. No fracture. Gas in the soft tissues from surgery noted. IMPRESSION: Status post left total knee replacement.  No acute finding. Electronically Signed   By: Inge Rise M.D.   On: 01/29/2019 11:01   Xr  Knee 3 View Left  Result Date: 01/02/2019 Stable medial compartment uni-compartment arthroplasty.  Degenerative changes of the patellofemoral compartment.  Lateral compartment appears to be well preserved.   Disposition: Discharge disposition: 01-Home or Self Care         Follow-up Information    Leandrew Koyanagi, MD In 2 weeks.   Specialty: Orthopedic Surgery Why: For suture removal, For wound re-check Contact information: [place] [phone]            Signed: Aundra Dubin 01/31/2019, 8:17 AM

## 2019-01-31 NOTE — Telephone Encounter (Signed)
Called and LMVM for patient with new appointment date/time per 8/18 VM from patient

## 2019-01-31 NOTE — TOC Transition Note (Signed)
Transition of Care [facility]) - CM/SW Discharge Note   Patient Details  Name: Brandi Lutz MRN: [id number] Date of Birth: 1975/11/12  [facility] CM/SW Contact:  Ninfa Meeker, RN Phone Number: [phone] (working remotely) 01/31/2019, 10:58 AM   Clinical Narrative:   43 yr old female s/p left total knee arthroplasty. Case manager spoke with patient via telephone concerning discharge plan. Patient is requesting a CPM for home. CM informed her that her insurance, [employer], does not Engineer, manufacturing provided her with cost per [employer]: $200.00 per week for first 2 weeks and then $100.00 per week should she want it longer. Patient said she is interested in getting machine for a week. CM contacted Ovid Curd with [employer] and also contacted Dr.Xu's office with request. Order must be sent from MD's office to [place] for CPM. Case manger confirmed with Joen Laura, [facility] Liaison that they will be providing Home Health therapy. Ovid Curd will deliver RW and 3in1 to patient's room.   Final next level of care: Home w Home Health Services Barriers to Discharge: No Barriers Identified   No further needs Patient Goals and CMS Choice     Choice offered to / list presented to : Patient  Discharge Placement                       Discharge Plan and Services In-house Referral: NA Discharge Planning Services: CM Consult Post Acute Care Choice: Durable Medical Equipment, Home Health          DME Arranged: CPM, Walker rolling, 3-N-1   Date DME Agency Contacted: 01/31/19 Time DME Agency Contacted: 59 Representative spoke with at DME Agency: Ovid Curd HH Arranged: PT [place]: [facility]patient was preoperatively setup with [facility]) Date [place]: 01/31/19 Time [place]: 1045(confirmed) Representative spoke with at [place]: [place] ([place]) Interventions      Readmission Risk Interventions No flowsheet data found.

## 2019-01-31 NOTE — Telephone Encounter (Signed)
Per Sherrie this has already been addressed. CPM has been ordered.

## 2019-01-31 NOTE — Telephone Encounter (Signed)
Manuela Schwartz, case manager at [facility] called stating an order is needed for a CPM machine through [place].  Stated that patient will do private pay.  CB# [phone].  Please advise.  Thank you.

## 2019-01-31 NOTE — Progress Notes (Signed)
Physical Therapy Treatment Patient Details Name: Brandi Lutz MRN: [id number] DOB: 05-11-1976 Today's Date: 01/31/2019    History of Present Illness Pt is a 43 year old female presenting s/p L TKR. Relevent PMH includes anxiety, depression, DM, and colon cancer. PSH includes L unicompartmental knee arthroplasty in 2016.    PT Comments    Pt performed gt training and functional mobility during session this am.  She progressed to stair training.  [name] performing bed mobility and transfers independently.  She required supervision for gait and stair training.  Plan for d/c home this pm.  Pt resting in CPM with ice applied post session.      Follow Up Recommendations  Follow surgeon's recommendation for DC plan and follow-up therapies     Equipment Recommendations  Rolling walker with 5" wheels;3in1 (PT)    Recommendations for Other Services       Precautions / Restrictions Precautions Precautions: Knee Precaution Booklet Issued: Yes (comment) Precaution Comments: reviewed knee precautions with pt Restrictions Weight Bearing Restrictions: Yes LLE Weight Bearing: Weight bearing as tolerated    Mobility  Bed Mobility Overal bed mobility: Needs Assistance Bed Mobility: Supine to Sit;Sit to Supine     Supine to sit: Supervision Sit to supine: Supervision   General bed mobility comments: pt used arms to self assist LLE  Transfers Overall transfer level: Modified independent Equipment used: Rolling walker (2 wheeled) Transfers: Sit to/from Stand Sit to Stand: Modified independent (Device/Increase time)            Ambulation/Gait Ambulation/Gait assistance: Supervision Gait Distance (Feet): 300 Feet Assistive device: Rolling walker (2 wheeled) Gait Pattern/deviations: Decreased weight shift to left;Trunk flexed;Antalgic;Step-through pattern     General Gait Details: Cues for upper trunk control, for heel strike on L and for toe off on L to prevent  circumduction.   Stairs Stairs: Yes Stairs assistance: Supervision Stair Management: One rail Left;Step to pattern;Forwards;With walker Number of Stairs: 6(x2 with RW and x4with L rail.) General stair comments: Pt performed x2 reps of curb negotiation with RW.  Cues for sequencing and RW placement,  Pt performed additional trial of stair negotiation with L rail.  X4 stairs.   Wheelchair Mobility    Modified Rankin (Stroke Patients Only)       Balance Overall balance assessment: Needs assistance Sitting-balance support: Feet supported;No upper extremity supported Sitting balance-Leahy Scale: Fair       Standing balance-Leahy Scale: Poor                              Cognition Arousal/Alertness: Awake/alert Behavior During Therapy: WFL for tasks assessed/performed Overall Cognitive Status: Within Functional Limits for tasks assessed                                        Exercises Total Joint Exercises Ankle Circles/Pumps: AROM;Both;20 reps;Supine Quad Sets: AROM;Left;10 reps;Supine Heel Slides: Left;10 reps;Supine;AAROM Hip ABduction/ADduction: AAROM;Left;10 reps;Supine Straight Leg Raises: AROM;Left;10 reps;Supine Goniometric ROM: 0-40 degrees L knee.    General Comments        Pertinent Vitals/Pain Pain Assessment: 0-10 Pain Score: 6  Faces Pain Scale: Hurts a little bit Pain Location: L knee Pain Descriptors / Indicators: Operative site guarding;Aching Pain Intervention(s): Monitored during session;Repositioned    Home Living Family/patient expects to be discharged to:: Private residence Living Arrangements: Parent Available Help at Discharge: Family;Available  24 hours/day           Additional Comments: small stand up shower    Prior Function Level of Independence: Independent with assistive device(s)      Comments: Pt able to perform mobility tasks independently, but notes increasing pain and time to complete tasks.  As symptoms worsens, she uses a crutch for mobility tasks.   PT Goals (current goals can now be found in the care plan section) Acute Rehab PT Goals Patient Stated Goal: walk without pain Potential to Achieve Goals: Good Progress towards PT goals: Progressing toward goals    Frequency    7X/week      PT Plan Current plan remains appropriate    Co-evaluation              AM-PAC PT "6 Clicks" Mobility   Outcome Measure  Help needed turning from your back to your side while in a flat bed without using bedrails?: A Little Help needed moving from lying on your back to sitting on the side of a flat bed without using bedrails?: A Little Help needed moving to and from a bed to a chair (including a wheelchair)?: A Lot Help needed standing up from a chair using your arms (e.g., wheelchair or bedside chair)?: A Lot Help needed to walk in hospital room?: A Lot Help needed climbing 3-5 steps with a railing? : A Lot 6 Click Score: 14    End of Session Equipment Utilized During Treatment: Gait belt Activity Tolerance: Patient limited by lethargy;Treatment limited secondary to medical complications (Comment) Patient left: with call bell/phone within reach;in bed;in CPM;with nursing/sitter in room;with family/visitor present;with SCD's reapplied Nurse Communication: Mobility status PT Visit Diagnosis: Pain;Unsteadiness on feet (R26.81);Muscle weakness (generalized) (M62.81);Difficulty in walking, not elsewhere classified (R26.2) Pain - Right/Left: Left Pain - part of body: Knee     Time: 1700-1749 PT Time Calculation (min) (ACUTE ONLY): 33 min  Charges:  $Gait Training: 8-22 mins $Therapeutic Exercise: 8-22 mins                     Governor Rooks, PTA Acute Rehabilitation Services Pager [phone] Office [phone]     Rashaud Ybarbo Eli Hose 01/31/2019, 12:26 PM

## 2019-01-31 NOTE — Progress Notes (Signed)
Pt is ambulating to the hall with walker. Pain is controlled with oral narcotics. Discharge instructions given to the pt, verbalized understanding. Discharged to home.

## 2019-02-01 ENCOUNTER — Encounter (HOSPITAL_COMMUNITY): Payer: Self-pay | Admitting: Orthopaedic Surgery

## 2019-02-05 ENCOUNTER — Encounter (HOSPITAL_COMMUNITY): Payer: Self-pay | Admitting: Orthopaedic Surgery

## 2019-02-05 ENCOUNTER — Telehealth: Payer: Self-pay

## 2019-02-05 NOTE — Telephone Encounter (Signed)
Mark from [facility] called stated needed verbal order for HHPT 1xwk/1 3xwk/1 1xwk/1 Verbal given.

## 2019-02-06 ENCOUNTER — Ambulatory Visit: Payer: Managed Care, Other (non HMO) | Admitting: Orthopaedic Surgery

## 2019-02-13 ENCOUNTER — Ambulatory Visit (INDEPENDENT_AMBULATORY_CARE_PROVIDER_SITE_OTHER): Payer: Managed Care, Other (non HMO) | Admitting: Physician Assistant

## 2019-02-13 ENCOUNTER — Encounter: Payer: Self-pay | Admitting: Orthopaedic Surgery

## 2019-02-13 ENCOUNTER — Telehealth: Payer: Self-pay | Admitting: Orthopaedic Surgery

## 2019-02-13 DIAGNOSIS — Z96652 Presence of left artificial knee joint: Secondary | ICD-10-CM

## 2019-02-13 MED ORDER — TRAMADOL HCL 50 MG PO TABS: ORAL_TABLET | ORAL | 1 refills | Status: DC

## 2019-02-13 NOTE — Telephone Encounter (Signed)
a 

## 2019-02-13 NOTE — Telephone Encounter (Signed)
Please advise thanks.

## 2019-02-13 NOTE — Telephone Encounter (Signed)
Mary-nurse case manager called concerning receiving the attending physician statement. Stanton Kidney advised need to know if provider is supporting a total disability from 08/17 thru 10/17   The number to contact Stanton Kidney is [phone]

## 2019-02-13 NOTE — Telephone Encounter (Signed)
Can you advise on this

## 2019-02-13 NOTE — Progress Notes (Signed)
Post-Op Visit Note   Patient: Brandi Lutz           Date of Birth: 10-30-1975           MRN: [id number] Visit Date: 02/13/2019 PCP: Benito Mccreedy, MD   Assessment & Plan:  Chief Complaint:  Chief Complaint  Patient presents with  . Left Knee - Follow-up   Visit Diagnoses:  1. S/P TKR (total knee replacement), left     Plan: Patient is a pleasant 43 year old female who presents our clinic today 2 weeks status post conversion of left unicompartmental replacement to total knee replacement, date of surgery 01/29/2019.  She has been doing fairly well.  She has been making slow but somewhat steady progress with home health physical therapy.  She has mild to moderate pain for which she is taking Tylenol.  The oxycodone makes her quite nauseous and requires the addition of Zofran.  No fevers or chills.  Examination of her left knee reveals a well-healing surgical incision with nylon sutures in place.  No evidence of infection or cellulitis.  Calf is soft nontender.  She does have moderate swelling to the left lower extremity.  Range of motion 0 to 80 degrees.  Today, nylon sutures were removed.  Steri-Strips applied.  We will start the patient in formal physical therapy and a prescription was provided today for this.  I have called in tramadol to take with therapy so that her pain is not limiting her range of motion.  I have instructed her to wear her TED hose for another few weeks until the swelling has subsided.  She will also continue to ice and elevate.  She will follow-up with [place] in 4 weeks time for repeat evaluation and 2 view x-rays of the left knee.  Call with concerns or questions in the meantime.  Follow-Up Instructions: Return in about 4 weeks (around 03/13/2019).   Orders:  Orders Placed This Encounter  Procedures  . Ambulatory referral to Physical Therapy   Meds ordered this encounter  Medications  . traMADol (ULTRAM) 50 MG tablet    Sig: Take 1-2 tabs po bid prn pain    Dispense:  30 tablet    Refill:  1    Imaging: No new imaging  PMFS History: Patient Active Problem List   Diagnosis Date Noted  . Status post total left knee replacement 01/29/2019  . Primary osteoarthritis of left knee 01/02/2019  . IDA (iron deficiency anemia) 03/18/2016  . 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 Status post left partial knee replacement 08/05/2014  . Other iron deficiency anemias 02/05/2013  . Gastric bypass status for obesity 02/05/2013   Past Medical History:  Diagnosis Date  . Anxiety   . Arthritis   . Cancer ([place])    colon  . Depression    takes Wellbutrin and Zoloft daily  . Diabetes mellitus without complication ([place])    diet and gastric surgery controlled  . Gastric bypass status for obesity 02/05/2013  . Headache   . History of colon polyps    benign  . History of migraine    none since high school  . Joint pain   . Joint swelling   . Other specified iron deficiency anemias 02/05/2013   2 iron infusions since gastric bypass;no abnormal reaction noted  . PONV (postoperative nausea and vomiting)   . S/P left unicompartmental knee replacement 08/05/2014    History reviewed. No pertinent family history.  Past Surgical History:  Procedure Laterality Date  .  APPENDECTOMY    . ARTHROSCOPIC REPAIR ACL Left 20 yrs ago  . CHOLECYSTECTOMY    . COLECTOMY    . COLONOSCOPY    . GASTRIC BYPASS    . herniated bowel repair    . KNEE ARTHROSCOPY Left   . PARTIAL KNEE ARTHROPLASTY Left 08/05/2014   Procedure: LEFT UNICOMPARTMENTAL KNEE;  Surgeon: Marianna Payment, MD;  Location: [facility];  Service: Orthopedics;  Laterality: Left;  . TOTAL KNEE ARTHROPLASTY Left 01/29/2019  . TOTAL KNEE REVISION Left 01/29/2019   Procedure: CONVERSION OF LEFT UNICOMPARTMENTAL KNEE ARTHROPLASTY TO LEFT TOTAL KNEE ARTHROPLASTY;  Surgeon: Leandrew Koyanagi, MD;  Location: [facility];  Service: Orthopedics;  Laterality: Left;   Social History   Occupational History  . Not on file   Tobacco Use  . Smoking status: Never Smoker  . Smokeless tobacco: Never Used  Substance and Sexual Activity  . Alcohol use: Yes    Alcohol/week: 0.0 standard drinks    Comment: rarely  . Drug use: No  . Sexual activity: Not Currently

## 2019-02-14 NOTE — Telephone Encounter (Signed)
Patient said this is under std.  Best to ask patient

## 2019-02-14 NOTE — Telephone Encounter (Signed)
Tried [employer] back. No answer. LMVM advising was returning call.

## 2019-02-21 ENCOUNTER — Ambulatory Visit: Payer: Managed Care, Other (non HMO) | Attending: Physician Assistant | Admitting: Physical Therapy

## 2019-02-21 ENCOUNTER — Ambulatory Visit: Payer: Managed Care, Other (non HMO) | Admitting: Family

## 2019-02-21 ENCOUNTER — Other Ambulatory Visit: Payer: Managed Care, Other (non HMO)

## 2019-02-21 ENCOUNTER — Encounter: Payer: Self-pay | Admitting: Physical Therapy

## 2019-02-21 ENCOUNTER — Other Ambulatory Visit: Payer: Self-pay

## 2019-02-21 DIAGNOSIS — M25562 Pain in left knee: Secondary | ICD-10-CM | POA: Diagnosis present

## 2019-02-21 DIAGNOSIS — M25662 Stiffness of left knee, not elsewhere classified: Secondary | ICD-10-CM | POA: Diagnosis present

## 2019-02-21 DIAGNOSIS — R262 Difficulty in walking, not elsewhere classified: Secondary | ICD-10-CM | POA: Insufficient documentation

## 2019-02-21 DIAGNOSIS — M6281 Muscle weakness (generalized): Secondary | ICD-10-CM | POA: Diagnosis present

## 2019-02-21 NOTE — Therapy (Signed)
[facility] [address]  [place], [place], [address] Phone: [phone]   Fax:  [phone]  Physical Therapy Evaluation  Patient Details  Name: Brandi Lutz MRN: [id number] Date of Birth: 1975/09/30 Referring Provider (PT): Frankey Shown, MD   Encounter Date: 02/21/2019  PT End of Session - 02/21/19 1625    Visit Number  1    Number of Visits  15    Date for PT Re-Evaluation  04/04/19    Authorization Type  [employer]    PT Start Time  1528    PT Stop Time  [id number]    PT Time Calculation (min)  46 min    Activity Tolerance  Patient tolerated treatment well;Patient limited by pain    Behavior During Therapy  [facility] for tasks assessed/performed       Past Medical History:  Diagnosis Date  . Anxiety   . Arthritis   . Cancer ([place])    colon  . Depression    takes Wellbutrin and Zoloft daily  . Diabetes mellitus without complication ([place])    diet and gastric surgery controlled  . Gastric bypass status for obesity 02/05/2013  . Headache   . History of colon polyps    benign  . History of migraine    none since high school  . Joint pain   . Joint swelling   . Other specified iron deficiency anemias 02/05/2013   2 iron infusions since gastric bypass;no abnormal reaction noted  . PONV (postoperative nausea and vomiting)   . 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 APPENDECTOMY    . ARTHROSCOPIC REPAIR ACL Left 20 yrs ago  . CHOLECYSTECTOMY    . COLECTOMY    . COLONOSCOPY    . GASTRIC BYPASS    . herniated bowel repair    . KNEE ARTHROSCOPY Left   . PARTIAL KNEE ARTHROPLASTY Left 08/05/2014   Procedure: LEFT UNICOMPARTMENTAL KNEE;  Surgeon: Marianna Payment, MD;  Location: [facility];  Service: Orthopedics;  Laterality: Left;  . TOTAL KNEE ARTHROPLASTY Left 01/29/2019  . TOTAL KNEE REVISION Left 01/29/2019   Procedure: CONVERSION OF LEFT  UNICOMPARTMENTAL KNEE ARTHROPLASTY TO LEFT TOTAL KNEE ARTHROPLASTY;  Surgeon: Leandrew Koyanagi, MD;  Location: [facility];  Service: Orthopedics;  Laterality: Left;    There were no vitals filed for this visit.   Subjective Assessment - 02/21/19 1529    Subjective  Patient reports that she underwent revision of L UKA to TKA on 01/29/19. This is the patient's 5th surgery on the L knee. Was using RW initially but has since transitioned to [facility]. Feels that she is quite reliant on the cane currently. Pain levels are manageable if taking meds. Denies fever, chills, night sweats, calf pain. Would like to work on flexion as this has been difficult for her after her previous surgeries, as well as climbing stairs, and walking without device. Had CPM and HHPT for 2.5 weeks. Works from home and her office is upstairs. Has been icing and elevating.    Pertinent History  colon CA, L UKA 2016, anemia, migraines, gastric bypass, DM, depression, anxiety, L knee arthroscopy, L ACL repair    Limitations  Sitting;Lifting;Standing;Walking;House hold activities    How long can you sit comfortably?  15 min    How long can you stand comfortably?  30 min as long as she is leaning on counter  How long can you walk comfortably?  variable, but up to an hour    Patient Stated Goals  be able to climb the stairs so that i can work from home    Currently in Pain?  Yes    Pain Score  2     Pain Location  Knee    Pain Orientation  Left    Pain Descriptors / Indicators  Aching    Pain Type  Acute pain;Surgical pain         [facility] PT Assessment - 02/21/19 1544      Assessment   Medical Diagnosis  s/p L TKA    Referring Provider (PT)  Frankey Shown, MD    Onset Date/Surgical Date  01/29/19    Next MD Visit  03/13/19    Prior Therapy  yes      Precautions   Precautions  --   hx CA     Balance Screen   Has the patient fallen in the past 6 months  No    Has the patient had a decrease in activity level because of a fear of  falling?   No    Is the patient reluctant to leave their home because of a fear of falling?   No      Home Environment   Living Environment  Private residence    Living Arrangements  Parent    Available Help at Discharge  Family    Type of [place] to enter    Entrance Stairs-Number of Steps  1    Entrance Stairs-Rails  None    Home Layout  Two level    Alternate Level Stairs-Number of Steps  15    Alternate Level Stairs-Rails  Left    Home Equipment  Walker - 2 wheels;Kasandra Knudsen - single point      Prior Function   Level of Independence  Independent    Vocation  Part time employment;Full time employment   part time until late Oct   Vocation Requirements  works from home for [employer]- sitting    Leisure  walking at the park      Cognition   Overall Cognitive Status  Within Functional Limits for tasks assessed      Observation/Other Assessments   Observations  well-healing L knee incision with slight scabbing remaining; L anlke edematous    Focus on Therapeutic Outcomes (FOTO)   Knee: 43 (57 limited, 39% predicted)      Sensation   Light Touch  Appears Intact      Coordination   Gross Motor Movements are Fluid and Coordinated  Yes      Posture/Postural Control   Posture/Postural Control  Postural limitations    Postural Limitations  Rounded Shoulders;Forward head;Weight shift right      ROM / Strength   AROM / PROM / Strength  AROM;PROM;Strength      AROM   AROM Assessment Site  Knee    Right/Left Knee  Right;Left    Right Knee Extension  0    Right Knee Flexion  138    Left Knee Extension  3    Left Knee Flexion  55   pain     PROM   PROM Assessment Site  Knee    Right/Left Knee  Right;Left    Right Knee Extension  -3    Right Knee Flexion  138    Left Knee Extension  2  Left Knee Flexion  55   pain     Strength   Strength Assessment Site  Hip;Knee;Ankle    Right/Left Hip  Right;Left    Right Hip Flexion  4+/5    Right Hip  ABduction  4+/5    Right Hip ADduction  4+/5    Left Hip Flexion  4+/5    Left Hip ABduction  4/5    Left Hip ADduction  4/5   pain   Right/Left Knee  Right;Left    Right Knee Flexion  4+/5    Right Knee Extension  4+/5    Left Knee Flexion  3+/5   pain   Left Knee Extension  3+/5   pain   Right/Left Ankle  Right;Left    Right Ankle Dorsiflexion  4/5    Right Ankle Plantar Flexion  4/5    Left Ankle Dorsiflexion  4/5    Left Ankle Plantar Flexion  4/5      Palpation   Patella mobility  R patella WNL; L knee moderately hypomobile in all directions     Palpation comment  TTP in L posterior knee and B lateral and medial knee; L calf supple and nontender      Ambulation/Gait   Assistive device  Straight cane    Gait Pattern  Step-through pattern;Decreased stance time - left;Decreased step length - right;Decreased hip/knee flexion - left;Decreased weight shift to left   increased L toe out   Ambulation Surface  Level;Indoor    Gait velocity  decreased                 Objective measurements completed on examination: See above findings.              PT Education - 02/21/19 1624    Education Details  prognosis, POC, HEP; edu on post-op precautions and instructions    Person(s) Educated  Patient    Methods  Explanation;Demonstration;Tactile cues;Verbal cues;Handout    Comprehension  Verbalized understanding;Returned demonstration       PT Short Term Goals - 02/21/19 1635      PT SHORT TERM GOAL #1   Title  Patient to be independent with initial HEP.    Time  3    Period  Weeks    Status  New    Target Date  03/14/19        PT Long Term Goals - 02/21/19 1636      PT LONG TERM GOAL #1   Title  Patient to be independent with advanced HEP.    Time  6    Period  Weeks    Status  New    Target Date  04/04/19      PT LONG TERM GOAL #2   Title  Patient to demonstrate L knee AROM/PROM 0-120 degrees.    Time  6    Period  Weeks    Status  New     Target Date  04/04/19      PT LONG TERM GOAL #3   Title  Patient to demonstrate reciprocal stair climbing up/down 13 steps with 1 handrail as needed with <3/10 pain and good stability.    Time  6    Period  Weeks    Status  New    Target Date  04/04/19      PT LONG TERM GOAL #4   Title  Patient to demonstrate B LE strength >=4+/5.    Time  6    Period  Weeks    Status  New    Target Date  04/04/19      PT LONG TERM GOAL #5   Title  Patient to demonstrate symmetrical weight shift, knee flexion, and step length with ambulation with LRAD.    Time  6    Period  Weeks    Status  New    Target Date  04/04/19             Plan - 02/21/19 1628    Clinical Impression Statement  Patient is a 43y/o F presenting to OPPT with c/o L knee pain s/p revision of L UKA to TKA on 01/29/19. Currently ambulating with SPC. Patient reports that this is her 5th L knee surgery, and would like to work on knee flexion as this has been difficult for her to regain previously. Patient works from home in her upstairs office, and would like to work on stair climbing as a result. Patient today demonstrating decreased L knee ROM, decreased L LE strength, decreased L patellar mobility, and gait deviations. Educated patient on gentle patellar mobilizations and knee ROM HEP. Patient reported understanding. Would benefit from skilled PT services 3x/week for 3 weeks and additional 2x/week for 3 weeks to address aforementioned impairments.    Personal Factors and Comorbidities  Age;Comorbidity 3+;Time since onset of injury/illness/exacerbation;Past/Current Experience;Fitness;Profession    Comorbidities  hx colon CA, L UKA 2016, anemia, migraines, gastric bypass, DM, depression, anxiety, L knee arthroscopy, L ACL repair    Examination-Activity Limitations  Bathing;Sit;Bed Mobility;Sleep;Bend;Squat;Caring for Others;Stairs;Carry;Stand;Toileting;Dressing;Transfers;Lift;Locomotion Level    Examination-Participation  Restrictions  Church;Cleaning;Shop;Community Activity;Driving;Yard Work;Interpersonal Relationship;Laundry;Meal Prep    Stability/Clinical Decision Making  Stable/Uncomplicated    Clinical Decision Making  Low    Rehab Potential  Good    PT Frequency  Other (comment)   3x/week for 3 weeks, 2x/week for 3 weeks   PT Duration  Other (comment)    PT Treatment/Interventions  ADLs/Self Care Home Management;Electrical Stimulation;Cryotherapy;Moist Heat;Balance training;Therapeutic exercise;Therapeutic activities;Functional mobility training;Stair training;Gait training;Ultrasound;Neuromuscular re-education;Patient/family education;Manual techniques;Vasopneumatic Device;Taping;Energy conservation;Dry needling;Passive range of motion;Scar mobilization    PT Next Visit Plan  reassess HEP    Consulted and Agree with Plan of Care  Patient       Patient will benefit from skilled therapeutic intervention in order to improve the following deficits and impairments:  Hypomobility, Increased edema, Decreased scar mobility, Decreased knowledge of precautions, Decreased activity tolerance, Decreased strength, Pain, Difficulty walking, Decreased balance, Decreased range of motion, Improper body mechanics, Postural dysfunction, Impaired flexibility  Visit Diagnosis: Acute pain of left knee  Stiffness of left knee, not elsewhere classified  Muscle weakness (generalized)  Difficulty in walking, not elsewhere classified     Problem List Patient Active Problem List   Diagnosis Date Noted  . Status post total left knee replacement 01/29/2019  . Primary osteoarthritis of left knee 01/02/2019  . IDA (iron deficiency anemia) 03/18/2016  . 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 Status post left partial knee replacement 08/05/2014  . Other iron deficiency anemias 02/05/2013  . Gastric bypass status for obesity 02/05/2013    Janene Harvey, PT, DPT 02/21/19 4:39 PM   [facility] [address]  [place], [place], [address] Phone: [phone]   Fax:  [phone]  Name: Brandi Lutz MRN: [id number] Date of Birth: 12/13/1975

## 2019-02-22 ENCOUNTER — Ambulatory Visit: Payer: Managed Care, Other (non HMO) | Admitting: Physical Therapy

## 2019-02-22 ENCOUNTER — Other Ambulatory Visit: Payer: Self-pay

## 2019-02-22 ENCOUNTER — Encounter: Payer: Self-pay | Admitting: Physical Therapy

## 2019-02-22 DIAGNOSIS — M25662 Stiffness of left knee, not elsewhere classified: Secondary | ICD-10-CM

## 2019-02-22 DIAGNOSIS — M6281 Muscle weakness (generalized): Secondary | ICD-10-CM

## 2019-02-22 DIAGNOSIS — R262 Difficulty in walking, not elsewhere classified: Secondary | ICD-10-CM

## 2019-02-22 DIAGNOSIS — M25562 Pain in left knee: Secondary | ICD-10-CM

## 2019-02-22 NOTE — Therapy (Signed)
[facility] [address]  [place], [place], [address] Phone: [phone]   Fax:  [phone]  Physical Therapy Treatment  Patient Details  Name: Brandi Lutz MRN: [id number] Date of Birth: 12-11-1975 Referring Provider (PT): Frankey Shown, MD   Encounter Date: 02/22/2019  PT End of Session - 02/22/19 1743    Visit Number  2    Number of Visits  15    Date for PT Re-Evaluation  04/04/19    Authorization Type  [employer]ign[employer]    PT Start Time  1701    PT Stop Time  [id number]    PT Time Calculation (min)  53 min    Activity Tolerance  Patient tolerated treatment well;Patient limited by pain    Behavior During Therapy  [facility] for tasks assessed/performed       Past Medical History:  Diagnosis Date  . Anxiety   . Arthritis   . Cancer ([place])    colon  . Depression    takes Wellbutrin and Zoloft daily  . Diabetes mellitus without complication ([place])    diet and gastric surgery controlled  . Gastric bypass status for obesity 02/05/2013  . Headache   . History of colon polyps    benign  . History of migraine    none since high school  . Joint pain   . Joint swelling   . Other specified iron deficiency anemias 02/05/2013   2 iron infusions since gastric bypass;no abnormal reaction noted  . PONV (postoperative nausea and vomiting)   . 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 APPENDECTOMY    . ARTHROSCOPIC REPAIR ACL Left 20 yrs ago  . CHOLECYSTECTOMY    . COLECTOMY    . COLONOSCOPY    . GASTRIC BYPASS    . herniated bowel repair    . KNEE ARTHROSCOPY Left   . PARTIAL KNEE ARTHROPLASTY Left 08/05/2014   Procedure: LEFT UNICOMPARTMENTAL KNEE;  Surgeon: Marianna Payment, MD;  Location: [facility];  Service: Orthopedics;  Laterality: Left;  . TOTAL KNEE ARTHROPLASTY Left 01/29/2019  . TOTAL KNEE REVISION Left 01/29/2019   Procedure: CONVERSION OF LEFT  UNICOMPARTMENTAL KNEE ARTHROPLASTY TO LEFT TOTAL KNEE ARTHROPLASTY;  Surgeon: Leandrew Koyanagi, MD;  Location: [facility];  Service: Orthopedics;  Laterality: Left;    There were no vitals filed for this visit.  Subjective Assessment - 02/22/19 1702    Subjective  Worked on her exercises. Does not see an improvement yet.    Pertinent History  colon CA, L UKA 2016, anemia, migraines, gastric bypass, DM, depression, anxiety, L knee arthroscopy, L ACL repair    Patient Stated Goals  be able to climb the stairs so that i can work from home    Currently in Pain?  Yes    Pain Score  3     Pain Location  Knee    Pain Orientation  Left    Pain Descriptors / Indicators  Aching    Pain Type  Acute pain;Surgical pain                       [facility] Adult PT Treatment/Exercise - 02/22/19 0001      Exercises   Exercises  Knee/Hip      Knee/Hip Exercises: Stretches   Passive Hamstring Stretch  Left;2 reps;30 seconds    Passive Hamstring Stretch Limitations  supine with strap  Hip Flexor Stretch  Left;2 reps;30 seconds    Hip Flexor Stretch Limitations  mod thomas stretch to tolerance    Other Knee/Hip Stretches  sitting L knee flexion stretch 5x10" to tolerance      Knee/Hip Exercises: Aerobic   Nustep  L1 x 85min (UEs/LEs)      Knee/Hip Exercises: Standing   Functional Squat  1 set;10 reps    Functional Squat Limitations  to tolerance at counter top   cues to shift L     Knee/Hip Exercises: Seated   Long Arc Quad  Strengthening;Left;1 set;10 reps    Long Arc Quad Limitations  cues for TKE    Hamstring Curl  Strengthening;Left;1 set;10 reps    Hamstring Limitations  yellow TB      Knee/Hip Exercises: Supine   Heel Slides  AAROM;Left;1 set;10 reps    Heel Slides Limitations  10x5" to tolerance with strap and orange pball    Straight Leg Raises  Strengthening;Left;1 set;10 reps    Straight Leg Raises Limitations  1#; slight lack in TKE    Straight Leg Raise with External Rotation   Strengthening;Left;1 set;10 reps    Straight Leg Raise with External Rotation Limitations  cues for alignment      Modalities   Modalities  Vasopneumatic      Vasopneumatic   Number Minutes Vasopneumatic   15 minutes    Vasopnuematic Location   Knee   L   Vasopneumatic Pressure  Medium    Vasopneumatic Temperature   coldest      Manual Therapy   Manual Therapy  Joint mobilization    Manual therapy comments  supine    Joint Mobilization  L patellar mobilizations grade III M/L and up/down- most hypomobility in superior/inferior directions             PT Education - 02/22/19 1742    Education Details  update to HEP    Person(s) Educated  Patient    Methods  Explanation;Demonstration;Tactile cues;Verbal cues;Handout    Comprehension  Verbalized understanding;Returned demonstration       PT Short Term Goals - 02/22/19 1749      PT SHORT TERM GOAL #1   Title  Patient to be independent with initial HEP.    Time  3    Period  Weeks    Status  Achieved    Target Date  03/14/19        PT Long Term Goals - 02/22/19 1749      PT LONG TERM GOAL #1   Title  Patient to be independent with advanced HEP.    Time  6    Period  Weeks    Status  On-going      PT LONG TERM GOAL #2   Title  Patient to demonstrate L knee AROM/PROM 0-120 degrees.    Time  6    Period  Weeks    Status  On-going      PT LONG TERM GOAL #3   Title  Patient to demonstrate reciprocal stair climbing up/down 13 steps with 1 handrail as needed with <3/10 pain and good stability.    Time  6    Period  Weeks    Status  On-going      PT LONG TERM GOAL #4   Title  Patient to demonstrate B LE strength >=4+/5.    Time  6    Period  Weeks    Status  On-going  PT LONG TERM GOAL #5   Title  Patient to demonstrate symmetrical weight shift, knee flexion, and step length with ambulation with LRAD.    Time  6    Period  Weeks    Status  On-going            Plan - 02/22/19 1743    Clinical  Impression Statement  Patient without complaints at start of session. Tolerated L patellar mobilizations in all directions without issues. Reviewed HEP with minor cues to correct form. Patient demonstrated tightness in L hip flexor with modified Thomas test. Updated HEP with this exercise at it was tolerated well. Patient reported understanding. Demonstrated improvement in performance with SLR with ER, and able to tolerate addition of 1lb with SLR. Patient remarking on her improved L knee flexion after performing mat ther-ex. Introduced mini squats at counter top with cues to shift to L LE- patient demonstrated good carryover of form. Ended session with [place] to L knee for post-exercises soreness and edema. No complaints at end of session.    Comorbidities  hx colon CA, L UKA 2016, anemia, migraines, gastric bypass, DM, depression, anxiety, L knee arthroscopy, L ACL repair    Rehab Potential  Good    PT Frequency  Other (comment)   3x/week for 3 weeks, 2x/week for 3 weeks   PT Duration  Other (comment)    PT Treatment/Interventions  ADLs/Self Care Home Management;Electrical Stimulation;Cryotherapy;Moist Heat;Balance training;Therapeutic exercise;Therapeutic activities;Functional mobility training;Stair training;Gait training;Ultrasound;Neuromuscular re-education;Patient/family education;Manual techniques;Vasopneumatic Device;Taping;Energy conservation;Dry needling;Passive range of motion;Scar mobilization    PT Next Visit Plan  progress L knee flexion    Consulted and Agree with Plan of Care  Patient       Patient will benefit from skilled therapeutic intervention in order to improve the following deficits and impairments:  Hypomobility, Increased edema, Decreased scar mobility, Decreased knowledge of precautions, Decreased activity tolerance, Decreased strength, Pain, Difficulty walking, Decreased balance, Decreased range of motion, Improper body mechanics, Postural dysfunction, Impaired  flexibility  Visit Diagnosis: Acute pain of left knee  Stiffness of left knee, not elsewhere classified  Muscle weakness (generalized)  Difficulty in walking, not elsewhere classified     Problem List Patient Active Problem List   Diagnosis Date Noted  . Status post total left knee replacement 01/29/2019  . Primary osteoarthritis of left knee 01/02/2019  . IDA (iron deficiency anemia) 03/18/2016  . 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 Status post left partial knee replacement 08/05/2014  . Other iron deficiency anemias 02/05/2013  . Gastric bypass status for obesity 02/05/2013     Janene Harvey, PT, DPT 02/22/19 5:59 PM   [facility] [address]  [place], [place], [address] Phone: [phone]   Fax:  [phone]  Name: AARILYN YAUGER MRN: [id number] Date of Birth: December 16, 1975

## 2019-02-23 ENCOUNTER — Ambulatory Visit: Payer: Managed Care, Other (non HMO) | Admitting: Family

## 2019-02-23 ENCOUNTER — Other Ambulatory Visit: Payer: Managed Care, Other (non HMO)

## 2019-02-26 ENCOUNTER — Other Ambulatory Visit: Payer: Self-pay

## 2019-02-26 ENCOUNTER — Ambulatory Visit: Payer: Managed Care, Other (non HMO)

## 2019-02-26 DIAGNOSIS — M25662 Stiffness of left knee, not elsewhere classified: Secondary | ICD-10-CM

## 2019-02-26 DIAGNOSIS — M6281 Muscle weakness (generalized): Secondary | ICD-10-CM

## 2019-02-26 DIAGNOSIS — M25562 Pain in left knee: Secondary | ICD-10-CM | POA: Diagnosis not present

## 2019-02-26 DIAGNOSIS — R262 Difficulty in walking, not elsewhere classified: Secondary | ICD-10-CM

## 2019-02-26 NOTE — Therapy (Signed)
[facility] [address]  [place], [place], [address] Phone: [phone]   Fax:  [phone]  Physical Therapy Treatment  Patient Details  Name: Brandi Lutz MRN: [id number] Date of Birth: Oct 14, 1975 Referring Provider (PT): Frankey Shown, MD   Encounter Date: 02/26/2019  PT End of Session - 02/26/19 1326    Visit Number  3    Number of Visits  15    Date for PT Re-Evaluation  04/04/19    Authorization Type  [employer]    PT Start Time  1314    PT Stop Time  1414    PT Time Calculation (min)  60 min    Activity Tolerance  Patient tolerated treatment well;Patient limited by pain    Behavior During Therapy  [facility] for tasks assessed/performed       Past Medical History:  Diagnosis Date  . Anxiety   . Arthritis   . Cancer ([place])    colon  . Depression    takes Wellbutrin and Zoloft daily  . Diabetes mellitus without complication ([place])    diet and gastric surgery controlled  . Gastric bypass status for obesity 02/05/2013  . Headache   . History of colon polyps    benign  . History of migraine    none since high school  . Joint pain   . Joint swelling   . Other specified iron deficiency anemias 02/05/2013   2 iron infusions since gastric bypass;no abnormal reaction noted  . PONV (postoperative nausea and vomiting)   . 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 APPENDECTOMY    . ARTHROSCOPIC REPAIR ACL Left 20 yrs ago  . CHOLECYSTECTOMY    . COLECTOMY    . COLONOSCOPY    . GASTRIC BYPASS    . herniated bowel repair    . KNEE ARTHROSCOPY Left   . PARTIAL KNEE ARTHROPLASTY Left 08/05/2014   Procedure: LEFT UNICOMPARTMENTAL KNEE;  Surgeon: Marianna Payment, MD;  Location: [facility];  Service: Orthopedics;  Laterality: Left;  . TOTAL KNEE ARTHROPLASTY Left 01/29/2019  . TOTAL KNEE REVISION Left 01/29/2019   Procedure: CONVERSION OF LEFT  UNICOMPARTMENTAL KNEE ARTHROPLASTY TO LEFT TOTAL KNEE ARTHROPLASTY;  Surgeon: Leandrew Koyanagi, MD;  Location: [facility];  Service: Orthopedics;  Laterality: Left;    There were no vitals filed for this visit.  Subjective Assessment - 02/26/19 1319    Subjective  Pt. noting some benefit from last HEP update.    Pertinent History  colon CA, L UKA 2016, anemia, migraines, gastric bypass, DM, depression, anxiety, L knee arthroscopy, L ACL repair    Patient Stated Goals  be able to climb the stairs so that i can work from home    Currently in Pain?  No/denies    Pain Score  0-No pain   up to 5/10 with max knee flexion   Pain Location  Knee    Pain Orientation  Left    Pain Descriptors / Indicators  Aching    Pain Type  Acute pain;Surgical pain    Multiple Pain Sites  No         [facility] PT Assessment - 02/26/19 0001      PROM   PROM Assessment Site  Knee    Right/Left Knee  Left    Left Knee Flexion  65   with strap on peanut p-ball supine  [facility] Adult PT Treatment/Exercise - 02/26/19 0001      Ambulation/Gait   Ambulation/Gait  Yes    Ambulation/Gait Assistance  5: Supervision    Ambulation Distance (Feet)  90 Feet    Assistive device  Straight cane    Gait Pattern  Step-through pattern;Decreased stance time - left;Decreased step length - right;Decreased hip/knee flexion - left;Decreased weight shift to left    Ambulation Surface  Level;Indoor    Pre-Gait Activities  toe- clears to 6" step to improve hip/knee flexion; good carryover with gait with SPC      Knee/Hip Exercises: Stretches   Hip Flexor Stretch  Left;2 reps;30 seconds    Hip Flexor Stretch Limitations  mod thomas stretch to tolerance    Other Knee/Hip Stretches  sitting L knee flexion stretch 5x10" to tolerance      Knee/Hip Exercises: Aerobic   Recumbent Bike  lvl 1, 6 min -partial rev      Knee/Hip Exercises: Standing   Heel Raises  Both;20 reps    Heel Raises Limitations  counter      Functional Squat  1 set;10 reps    Functional Squat Limitations  counter top - flexion functional stretch    Other Standing Knee Exercises  L toe clears with 1# at ankle to 6" step; counter support x 10 reps    cues to avoid circumduction and excessive hip hike     Knee/Hip Exercises: Seated   Other Seated Knee/Hip Exercises  Seated L fitter leg press (1 black band) x 15 rpes       Knee/Hip Exercises: Supine   Heel Slides  AAROM;Left;1 set;10 reps    Heel Slides Limitations  10x5" to tolerance with strap and orange pball      Vasopneumatic   Number Minutes Vasopneumatic   15 minutes    Vasopnuematic Location   Knee    Vasopneumatic Pressure  Medium    Vasopneumatic Temperature   coldest      Manual Therapy   Manual Therapy  Soft tissue mobilization    Manual therapy comments  seated     Joint Mobilization  L patellar mobilizations grade III M/L and up/down- most hypomobility in superior/inferior directions    Soft tissue mobilization  Scar massage with pt. instructed in proper technique                PT Short Term Goals - 02/22/19 1749      PT SHORT TERM GOAL #1   Title  Patient to be independent with initial HEP.    Time  3    Period  Weeks    Status  Achieved    Target Date  03/14/19        PT Long Term Goals - 02/22/19 1749      PT LONG TERM GOAL #1   Title  Patient to be independent with advanced HEP.    Time  6    Period  Weeks    Status  On-going      PT LONG TERM GOAL #2   Title  Patient to demonstrate L knee AROM/PROM 0-120 degrees.    Time  6    Period  Weeks    Status  On-going      PT LONG TERM GOAL #3   Title  Patient to demonstrate reciprocal stair climbing up/down 13 steps with 1 handrail as needed with <3/10 pain and good stability.    Time  6    Period  Weeks  Status  On-going      PT LONG TERM GOAL #4   Title  Patient to demonstrate B LE strength >=4+/5.    Time  6    Period  Weeks    Status  On-going      PT LONG TERM GOAL  #5   Title  Patient to demonstrate symmetrical weight shift, knee flexion, and step length with ambulation with LRAD.    Time  6    Period  Weeks    Status  On-going            Plan - 02/26/19 1352    Clinical Impression Statement  Pt. reporting no issue with updated HEP hip flexor/quad stretch and was able to demo strap assisted PROM flexion of 65 dg today.  Instructed pt. on importance of scar massage with lotion and continued focus of improving flexion ROM at home with pt. verbalizing understanding.  Able to progress functional knee flexion activities (standing toe-clears to step and counter squat) without significant increase in pain.  Ended visit with ice/compression to L knee to reduce post-exercise swelling.  Will continue to progress toward goals.    Personal Factors and Comorbidities  Age;Comorbidity 3+;Time since onset of injury/illness/exacerbation;Past/Current Experience;Fitness;Profession    Comorbidities  hx colon CA, L UKA 2016, anemia, migraines, gastric bypass, DM, depression, anxiety, L knee arthroscopy, L ACL repair    Rehab Potential  Good    PT Treatment/Interventions  ADLs/Self Care Home Management;Electrical Stimulation;Cryotherapy;Moist Heat;Balance training;Therapeutic exercise;Therapeutic activities;Functional mobility training;Stair training;Gait training;Ultrasound;Neuromuscular re-education;Patient/family education;Manual techniques;Vasopneumatic Device;Taping;Energy conservation;Dry needling;Passive range of motion;Scar mobilization    PT Next Visit Plan  progress L knee flexion    Consulted and Agree with Plan of Care  Patient       Patient will benefit from skilled therapeutic intervention in order to improve the following deficits and impairments:  Hypomobility, Increased edema, Decreased scar mobility, Decreased knowledge of precautions, Decreased activity tolerance, Decreased strength, Pain, Difficulty walking, Decreased balance, Decreased range of motion,  Improper body mechanics, Postural dysfunction, Impaired flexibility  Visit Diagnosis: Acute pain of left knee  Stiffness of left knee, not elsewhere classified  Muscle weakness (generalized)  Difficulty in walking, not elsewhere classified     Problem List Patient Active Problem List   Diagnosis Date Noted  . Status post total left knee replacement 01/29/2019  . Primary osteoarthritis of left knee 01/02/2019  . IDA (iron deficiency anemia) 03/18/2016  . 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 Status post left partial knee replacement 08/05/2014  . Other iron deficiency anemias 02/05/2013  . Gastric bypass status for obesity 02/05/2013    Bess Harvest, PTA 02/26/19 2:34 PM   [facility] [address]  [place], [place], [address] Phone: [phone]   Fax:  [phone]  Name: DORRIS RIEHLE MRN: [id number] Date of Birth: [birth year]

## 2019-02-27 ENCOUNTER — Telehealth: Payer: Self-pay | Admitting: Family

## 2019-02-27 ENCOUNTER — Inpatient Hospital Stay: Payer: Managed Care, Other (non HMO) | Attending: Family

## 2019-02-27 ENCOUNTER — Other Ambulatory Visit: Payer: Self-pay

## 2019-02-27 ENCOUNTER — Inpatient Hospital Stay (HOSPITAL_BASED_OUTPATIENT_CLINIC_OR_DEPARTMENT_OTHER): Payer: Managed Care, Other (non HMO) | Admitting: Family

## 2019-02-27 VITALS — BP 131/84 | HR 83 | Temp 97.9°F | Resp 18 | Wt 238.0 lb

## 2019-02-27 DIAGNOSIS — K909 Intestinal malabsorption, unspecified: Secondary | ICD-10-CM

## 2019-02-27 DIAGNOSIS — Z9884 Bariatric surgery status: Secondary | ICD-10-CM | POA: Insufficient documentation

## 2019-02-27 DIAGNOSIS — D508 Other iron deficiency anemias: Secondary | ICD-10-CM | POA: Diagnosis not present

## 2019-02-27 DIAGNOSIS — Z7982 Long term (current) use of aspirin: Secondary | ICD-10-CM | POA: Diagnosis not present

## 2019-02-27 DIAGNOSIS — Z79899 Other long term (current) drug therapy: Secondary | ICD-10-CM | POA: Insufficient documentation

## 2019-02-27 DIAGNOSIS — D509 Iron deficiency anemia, unspecified: Secondary | ICD-10-CM | POA: Diagnosis not present

## 2019-02-27 DIAGNOSIS — D510 Vitamin B12 deficiency anemia due to intrinsic factor deficiency: Secondary | ICD-10-CM | POA: Insufficient documentation

## 2019-02-27 LAB — CBC WITH DIFFERENTIAL (CANCER CENTER ONLY)
Abs Immature Granulocytes: 0.01 10*3/uL (ref 0.00–0.07)
Basophils Absolute: 0 10*3/uL (ref 0.0–0.1)
Basophils Relative: 1 %
Eosinophils Absolute: 0.2 10*3/uL (ref 0.0–0.5)
Eosinophils Relative: 4 %
HCT: 39.4 % (ref 36.0–46.0)
Hemoglobin: 12.4 g/dL (ref 12.0–15.0)
Immature Granulocytes: 0 %
Lymphocytes Relative: 25 %
Lymphs Abs: 1.2 10*3/uL (ref 0.7–4.0)
MCH: 26.4 pg (ref 26.0–34.0)
MCHC: 31.5 g/dL (ref 30.0–36.0)
MCV: 84 fL (ref 80.0–100.0)
Monocytes Absolute: 0.4 10*3/uL (ref 0.1–1.0)
Monocytes Relative: 9 %
Neutro Abs: 2.9 10*3/uL (ref 1.7–7.7)
Neutrophils Relative %: 61 %
Platelet Count: 315 10*3/uL (ref 150–400)
RBC: 4.69 MIL/uL (ref 3.87–5.11)
RDW: 13.4 % (ref 11.5–15.5)
WBC Count: 4.8 10*3/uL (ref 4.0–10.5)
nRBC: 0 % (ref 0.0–0.2)

## 2019-02-27 LAB — RETICULOCYTES
Immature Retic Fract: 7.4 % (ref 2.3–15.9)
RBC.: 4.62 MIL/uL (ref 3.87–5.11)
Retic Count, Absolute: 65.1 10*3/uL (ref 19.0–186.0)
Retic Ct Pct: 1.4 % (ref 0.4–3.1)

## 2019-02-27 NOTE — Telephone Encounter (Signed)
Called and LMVM for patient w/ appt date/time per 9/15 los

## 2019-02-27 NOTE — Progress Notes (Signed)
Hematology and Oncology Follow Up Visit  Brandi Lutz [id number] 06-30-1975 43 y.o. 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 She had a total left knee replacement in August and is recuperating nicely. [name] have PT 3 days a week.  She still has some mild swelling in the left knee and ankle but states that this is improved. She does wear a compression stocking during the day.  [name] feeling fatigued and has noted chills. Iron studies are pending. Hgb is stable at 12.4, MCV 84.  No episodes of bleeding. No bruising or petechiae.  No issues with infection. No fever, n/v, cough, rash, dizziness, SOB, chest pain, palpitations, abdominal pain or changes in bowel or bladder habits.  She has maintained a good appetite and is staying well hydrated. Her weight is stable.  [name] back to work part time from home.   ECOG Performance Status: 1 - Symptomatic but completely ambulatory  Medications:  Allergies as of 02/27/2019      Reactions   Tape Rash      Medication List       Accurate as of February 27, 2019 12:13 PM. If you have any questions, ask your nurse or doctor.        aspirin EC 81 MG tablet Take 1 tablet (81 mg total) by mouth 2 (two) times daily.   buPROPion 150 MG 24 hr tablet Commonly known as: WELLBUTRIN XL Take 150 mg by mouth daily.   clindamycin-tretinoin gel Commonly known as: ZIANA Apply 1 application topically daily as needed (acne).   diphenhydrAMINE 25 MG tablet Commonly known as: SOMINEX Take 25 mg by mouth at bedtime as needed for sleep.   methocarbamol 750 MG tablet Commonly known as: ROBAXIN Take 1 tablet (750 mg total) by mouth 2 (two) times daily as needed for muscle spasms.   multivitamin with minerals Tabs  tablet Take 2 tablets by mouth daily.   ondansetron 4 MG tablet Commonly known as: ZOFRAN Take 1-2 tablets (4-8 mg total) by mouth every 8 (eight) hours as needed for nausea or vomiting.   oxyCODONE 5 MG immediate release tablet Commonly known as: Oxy IR/ROXICODONE Take 1-2 tablets (5-10 mg total) by mouth every 8 (eight) hours as needed for severe pain.   promethazine 25 MG tablet Commonly known as: PHENERGAN Take 1 tablet (25 mg total) by mouth every 6 (six) hours as needed for nausea.   senna-docusate 8.6-50 MG tablet Commonly known as: Senokot S Take 1-2 tablets by mouth at bedtime as needed.   sertraline 50 MG tablet Commonly known as: ZOLOFT Take 50 mg by mouth daily.   sulfamethoxazole-trimethoprim 800-160 MG tablet Commonly known as: BACTRIM DS Take 1 tablet by mouth 2 (two) times daily.   traMADol 50 MG tablet Commonly known as: ULTRAM Take 1-2 tabs po bid prn pain       Allergies:  Allergies  Allergen Reactions  . Tape Rash    Past Medical History, Surgical history, Social history, and Family History were reviewed and updated.  Review of Systems: All other 10 point review of systems is negative.   Physical Exam:  vitals were not taken for this visit.   Wt Readings from Last 3 Encounters:  01/29/19 230 lb 3.2 oz (104.4 kg)  01/26/19 230 lb 3.2 oz (104.4 kg)  01/02/19 228 lb (103.4 kg)    Ocular: Sclerae unicteric, pupils equal, round and reactive to light Ear-nose-throat: Oropharynx clear, dentition fair Lymphatic: No cervical or supraclavicular adenopathy Lungs no rales or rhonchi, good excursion bilaterally Heart regular rate and rhythm, no murmur appreciated Abd soft, nontender, positive bowel sounds, no liver or spleen tip palpated on exam, no fluid wave  MSK no focal spinal tenderness, no joint edema Neuro: non-focal, well-oriented, appropriate affect Breasts: Deferred   Lab Results  Component Value Date   WBC 4.8 02/27/2019   HGB 12.4  02/27/2019   HCT 39.4 02/27/2019   MCV 84.0 02/27/2019   PLT 315 02/27/2019   Lab Results  Component Value Date   FERRITIN 5 (L) 10/24/2018   IRON 50 10/24/2018   TIBC 405 10/24/2018   UIBC 355 10/24/2018   IRONPCTSAT 12 (L) 10/24/2018   Lab Results  Component Value Date   RETICCTPCT 1.4 02/27/2019   RBC 4.69 02/27/2019   RETICCTABS 31.3 05/02/2015   No results found for: KPAFRELGTCHN, LAMBDASER, KAPLAMBRATIO No results found for: IGGSERUM, IGA, IGMSERUM No results found for: Ronnald Ramp, A1GS, A2GS, Violet Baldy, MSPIKE, SPEI   Chemistry      Component Value Date/Time   NA 140 01/30/2019 0310   K 4.1 01/30/2019 0310   CL 107 01/30/2019 0310   CO2 28 01/30/2019 0310   BUN 7 01/30/2019 0310   CREATININE 0.90 01/30/2019 0310      Component Value Date/Time   CALCIUM 8.2 (L) 01/30/2019 0310   ALKPHOS 69 01/26/2019 0904   AST 22 01/26/2019 0904   ALT 24 01/26/2019 0904   BILITOT 0.5 01/26/2019 0904       Impression and Plan: Brandi Lutz is a very pleasant 43 yo African American female with iron deficiency anemia secondary to malabsorption after gastric bypass.  She received had a left knee replacement and is doing well.  She has noted some fatigue and chills.  We will see what her iron studies show and bring her back in for infusion if needed.  We will go ahead and plan to see her back in another 3 months.  She will contact our office with any questions or concerns. We can certainly see her sooner if needed.   Laverna Peace, NP 9/15/202012:13 PM

## 2019-02-28 ENCOUNTER — Ambulatory Visit: Payer: Managed Care, Other (non HMO) | Admitting: Physical Therapy

## 2019-02-28 ENCOUNTER — Encounter: Payer: Self-pay | Admitting: Physical Therapy

## 2019-02-28 DIAGNOSIS — M25562 Pain in left knee: Secondary | ICD-10-CM

## 2019-02-28 DIAGNOSIS — M25662 Stiffness of left knee, not elsewhere classified: Secondary | ICD-10-CM

## 2019-02-28 DIAGNOSIS — M6281 Muscle weakness (generalized): Secondary | ICD-10-CM

## 2019-02-28 DIAGNOSIS — R262 Difficulty in walking, not elsewhere classified: Secondary | ICD-10-CM

## 2019-02-28 LAB — IRON AND TIBC
Iron: 111 ug/dL (ref 41–142)
Saturation Ratios: 33 % (ref 21–57)
TIBC: 334 ug/dL (ref 236–444)
UIBC: 223 ug/dL (ref 120–384)

## 2019-02-28 LAB — FERRITIN: Ferritin: 90 ng/mL (ref 11–307)

## 2019-02-28 NOTE — Therapy (Signed)
[facility] [address]  [place], [place], [address] Phone: [phone]   Fax:  [phone]  Physical Therapy Treatment  Patient Details  Name: Brandi Lutz MRN: [id number] Date of Birth: [birth year] Referring Provider (PT): Frankey Shown, MD   Encounter Date: 02/28/2019  PT End of Session - 02/28/19 1358    Visit Number  4    Number of Visits  15    Date for PT Re-Evaluation  04/04/19    Authorization Type  [employer]igna    PT Start Time  1316    PT Stop Time  1406    PT Time Calculation (min)  50 min    Activity Tolerance  Patient tolerated treatment well;Patient limited by pain    Behavior During Therapy  [facility] for tasks assessed/performed       Past Medical History:  Diagnosis Date  . Anxiety   . Arthritis   . Cancer ([place])    colon  . Depression    takes Wellbutrin and Zoloft daily  . Diabetes mellitus without complication ([place])    diet and gastric surgery controlled  . Gastric bypass status for obesity 02/05/2013  . Headache   . History of colon polyps    benign  . History of migraine    none since high school  . Joint pain   . Joint swelling   . Other specified iron deficiency anemias 02/05/2013   2 iron infusions since gastric bypass;no abnormal reaction noted  . PONV (postoperative nausea and vomiting)   . 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 APPENDECTOMY    . ARTHROSCOPIC REPAIR ACL Left 20 yrs ago  . CHOLECYSTECTOMY    . COLECTOMY    . COLONOSCOPY    . GASTRIC BYPASS    . herniated bowel repair    . KNEE ARTHROSCOPY Left   . PARTIAL KNEE ARTHROPLASTY Left 08/05/2014   Procedure: LEFT UNICOMPARTMENTAL KNEE;  Surgeon: Marianna Payment, MD;  Location: [facility];  Service: Orthopedics;  Laterality: Left;  . TOTAL KNEE ARTHROPLASTY Left 01/29/2019  . TOTAL KNEE REVISION Left 01/29/2019   Procedure: CONVERSION OF LEFT  UNICOMPARTMENTAL KNEE ARTHROPLASTY TO LEFT TOTAL KNEE ARTHROPLASTY;  Surgeon: Leandrew Koyanagi, MD;  Location: [facility];  Service: Orthopedics;  Laterality: Left;    There were no vitals filed for this visit.  Subjective Assessment - 02/28/19 1317    Subjective  Doing good. Has tried performing scar massage which is tender over the proximal half of the scar.    Pertinent History  colon CA, L UKA 2016, anemia, migraines, gastric bypass, DM, depression, anxiety, L knee arthroscopy, L ACL repair    Patient Stated Goals  be able to climb the stairs so that i can work from home    Currently in Pain?  Yes    Pain Score  3     Pain Location  Knee    Pain Orientation  Left    Pain Descriptors / Indicators  Aching    Pain Type  Acute pain;Surgical pain         [facility] PT Assessment - 02/28/19 0001      AROM   Left Knee Flexion  74   after manual therapy     PROM   Left Knee Flexion  70   during heel slides  [facility] Adult PT Treatment/Exercise - 02/28/19 0001      Knee/Hip Exercises: Stretches   Control and instrumentation engineer reps;30 seconds    Quad Stretch Limitations  prone with strap   pillow under L knee for comfort     Knee/Hip Exercises: Aerobic   Recumbent Bike  lvl 1, 6 min -partial rev      Knee/Hip Exercises: Seated   Other Seated Knee/Hip Exercises  sitting L knee flexion stretch to tolerance 5x10"    Other Seated Knee/Hip Exercises  Seated L fitter leg press (1 black band) 2x10   good TKE   Sit to Sand  2 sets;5 reps;with UE support   2 airex pads and 1 UE support     Knee/Hip Exercises: Supine   Heel Slides  AAROM;Left;1 set;10 reps    Heel Slides Limitations  10x5" to tolerance with strap and orange pball      Knee/Hip Exercises: Prone   Hip Extension  Strengthening;Right;Left;1 set;10 reps    Hip Extension Limitations  straight knee      Vasopneumatic   Number Minutes Vasopneumatic   10 minutes    Vasopnuematic Location   Knee   L    Vasopneumatic Pressure  Medium    Vasopneumatic Temperature   coldest      Manual Therapy   Manual Therapy  Joint mobilization    Manual therapy comments  supine    Joint Mobilization  L patellar mobilizations grade III M/L and up/down- most hypomobility in superior/inferior directions; posterior tibial joint mobs grade IV to tolerance with prolonged L flexion holds             PT Education - 02/28/19 1358    Education Details  update to HEP    Person(s) Educated  Patient    Methods  Explanation;Demonstration;Tactile cues;Verbal cues;Handout    Comprehension  Verbalized understanding;Returned demonstration       PT Short Term Goals - 02/22/19 1749      PT SHORT TERM GOAL #1   Title  Patient to be independent with initial HEP.    Time  3    Period  Weeks    Status  Achieved    Target Date  03/14/19        PT Long Term Goals - 02/22/19 1749      PT LONG TERM GOAL #1   Title  Patient to be independent with advanced HEP.    Time  6    Period  Weeks    Status  On-going      PT LONG TERM GOAL #2   Title  Patient to demonstrate L knee AROM/PROM 0-120 degrees.    Time  6    Period  Weeks    Status  On-going      PT LONG TERM GOAL #3   Title  Patient to demonstrate reciprocal stair climbing up/down 13 steps with 1 handrail as needed with <3/10 pain and good stability.    Time  6    Period  Weeks    Status  On-going      PT LONG TERM GOAL #4   Title  Patient to demonstrate B LE strength >=4+/5.    Time  6    Period  Weeks    Status  On-going      PT LONG TERM GOAL #5   Title  Patient to demonstrate symmetrical weight shift, knee flexion, and step length with ambulation with LRAD.    Time  6  Period  Weeks    Status  On-going            Plan - 02/28/19 1358    Clinical Impression Statement  Patient reporting tenderness with scar massage over proximal portion of scar. Educated patient on desensitization for improved tolerance. Tolerated L patellar  mobs and posterior proximal tibial mobs from improved knee flexion. Patient was able to reach 74 degrees of L knee AROM and 70 degrees of AAROM during heel slides after manual therapy. Demonstrated good TKE with fitter leg press with moderate resistance. Introduced STS with elevated seat and 1 UE assistance to stand. Patient apprehensive and with difficulty at first, but performance improved with increased reps. Ended session with [place] to L knee for edema and pain. No complaints at end of session.    Personal Factors and Comorbidities  Age;Comorbidity 3+;Time since onset of injury/illness/exacerbation;Past/Current Experience;Fitness;Profession    Comorbidities  hx colon CA, L UKA 2016, anemia, migraines, gastric bypass, DM, depression, anxiety, L knee arthroscopy, L ACL repair    Rehab Potential  Good    PT Treatment/Interventions  ADLs/Self Care Home Management;Electrical Stimulation;Cryotherapy;Moist Heat;Balance training;Therapeutic exercise;Therapeutic activities;Functional mobility training;Stair training;Gait training;Ultrasound;Neuromuscular re-education;Patient/family education;Manual techniques;Vasopneumatic Device;Taping;Energy conservation;Dry needling;Passive range of motion;Scar mobilization    PT Next Visit Plan  progress L knee flexion    Consulted and Agree with Plan of Care  Patient       Patient will benefit from skilled therapeutic intervention in order to improve the following deficits and impairments:  Hypomobility, Increased edema, Decreased scar mobility, Decreased knowledge of precautions, Decreased activity tolerance, Decreased strength, Pain, Difficulty walking, Decreased balance, Decreased range of motion, Improper body mechanics, Postural dysfunction, Impaired flexibility  Visit Diagnosis: Acute pain of left knee  Stiffness of left knee, not elsewhere classified  Muscle weakness (generalized)  Difficulty in walking, not elsewhere classified     Problem  List Patient Active Problem List   Diagnosis Date Noted  . Status post total left knee replacement 01/29/2019  . Primary osteoarthritis of left knee 01/02/2019  . IDA (iron deficiency anemia) 03/18/2016  . 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 Status post left partial knee replacement 08/05/2014  . Other iron deficiency anemias 02/05/2013  . Gastric bypass status for obesity 02/05/2013     Janene Harvey, PT, DPT 02/28/19 2:09 PM   [facility] [address]  [place], [place], [address] Phone: [phone]   Fax:  [phone]  Name: TIMYAH TEBBE MRN: [id number] Date of Birth: Sep 19, 1975

## 2019-03-01 ENCOUNTER — Encounter: Payer: Self-pay | Admitting: Physical Therapy

## 2019-03-01 ENCOUNTER — Ambulatory Visit: Payer: Managed Care, Other (non HMO) | Admitting: Physical Therapy

## 2019-03-01 ENCOUNTER — Other Ambulatory Visit: Payer: Self-pay

## 2019-03-01 DIAGNOSIS — M25562 Pain in left knee: Secondary | ICD-10-CM

## 2019-03-01 DIAGNOSIS — M6281 Muscle weakness (generalized): Secondary | ICD-10-CM

## 2019-03-01 DIAGNOSIS — M25662 Stiffness of left knee, not elsewhere classified: Secondary | ICD-10-CM

## 2019-03-01 DIAGNOSIS — R262 Difficulty in walking, not elsewhere classified: Secondary | ICD-10-CM

## 2019-03-01 NOTE — Therapy (Signed)
[facility] [address]  [place], [place], [address] Phone: [phone]   Fax:  [phone]  Physical Therapy Treatment  Patient Details  Name: Brandi Lutz MRN: [id number] Date of Birth: June 09, 1976 Referring Provider (PT): Frankey Shown, MD   Encounter Date: 03/01/2019  PT End of Session - 03/01/19 1605    Visit Number  5    Number of Visits  15    Date for PT Re-Evaluation  04/04/19    Authorization Type  [employer]    PT Start Time  1538    PT Stop Time  1605    PT Time Calculation (min)  27 min    Activity Tolerance  Patient tolerated treatment well    Behavior During Therapy  [facility] for tasks assessed/performed       Past Medical History:  Diagnosis Date  . Anxiety   . Arthritis   . Cancer ([place])    colon  . Depression    takes Wellbutrin and Zoloft daily  . Diabetes mellitus without complication ([place])    diet and gastric surgery controlled  . Gastric bypass status for obesity 02/05/2013  . Headache   . History of colon polyps    benign  . History of migraine    none since high school  . Joint pain   . Joint swelling   . Other specified iron deficiency anemias 02/05/2013   2 iron infusions since gastric bypass;no abnormal reaction noted  . PONV (postoperative nausea and vomiting)   . 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 APPENDECTOMY    . ARTHROSCOPIC REPAIR ACL Left 20 yrs ago  . CHOLECYSTECTOMY    . COLECTOMY    . COLONOSCOPY    . GASTRIC BYPASS    . herniated bowel repair    . KNEE ARTHROSCOPY Left   . PARTIAL KNEE ARTHROPLASTY Left 08/05/2014   Procedure: LEFT UNICOMPARTMENTAL KNEE;  Surgeon: Marianna Payment, MD;  Location: [facility];  Service: Orthopedics;  Laterality: Left;  . TOTAL KNEE ARTHROPLASTY Left 01/29/2019  . TOTAL KNEE REVISION Left 01/29/2019   Procedure: CONVERSION OF LEFT UNICOMPARTMENTAL KNEE ARTHROPLASTY  TO LEFT TOTAL KNEE ARTHROPLASTY;  Surgeon: Leandrew Koyanagi, MD;  Location: [place];  Service: Orthopedics;  Laterality: Left;    There were no vitals filed for this visit.  Subjective Assessment - 03/01/19 1543    Subjective  Felt fine after last session.    Pertinent History  colon CA, L UKA 2016, anemia, migraines, gastric bypass, DM, depression, anxiety, L knee arthroscopy, L ACL repair    Patient Stated Goals  be able to climb the stairs so that i can work from home    Currently in Pain?  Yes    Pain Score  3     Pain Location  Knee    Pain Orientation  Left    Pain Descriptors / Indicators  Aching    Pain Type  Acute pain;Surgical pain                       [facility] Adult PT Treatment/Exercise - 03/01/19 0001      Knee/Hip Exercises: Aerobic   Recumbent Bike  lvl 1, 6 min -partial rev      Knee/Hip Exercises: Standing   Heel Raises  Both;1 set;15 reps   cues for L wt shift   Heel Raises Limitations  counter     Hip Abduction  Stengthening;Right;Left;1 set;10 reps;Knee straight    Abduction Limitations  at counter    Hip Extension  Stengthening;Right;Left;1 set;10 reps;Knee straight    Extension Limitations  at counter    Lateral Step Up  Left;1 set;10 reps;Hand Hold: 2;Step Height: 4"    Lateral Step Up Limitations  mild c/o pain    Forward Step Up  Left;1 set;10 reps;Step Height: 6";Hand Hold: 1    Forward Step Up Limitations  step up/down with R UE support on counter   cues to avoid circumduction   Functional Squat  1 set;10 reps    Functional Squat Limitations  at counter top- cues to avoid push into pain     Other Standing Knee Exercises  sidestepping with yellow TB at toes 4x length of counter   good form   Other Standing Knee Exercises  staggered L knee flexion stretch at counter top 5x10" to tolerance             PT Education - 03/01/19 1605    Education Details  update & discussion on current HEP    Person(s) Educated  Patient    Methods   Explanation;Demonstration;Tactile cues;Verbal cues;Handout    Comprehension  Verbalized understanding;Returned demonstration       PT Short Term Goals - 02/22/19 1749      PT SHORT TERM GOAL #1   Title  Patient to be independent with initial HEP.    Time  3    Period  Weeks    Status  Achieved    Target Date  03/14/19        PT Long Term Goals - 02/22/19 1749      PT LONG TERM GOAL #1   Title  Patient to be independent with advanced HEP.    Time  6    Period  Weeks    Status  On-going      PT LONG TERM GOAL #2   Title  Patient to demonstrate L knee AROM/PROM 0-120 degrees.    Time  6    Period  Weeks    Status  On-going      PT LONG TERM GOAL #3   Title  Patient to demonstrate reciprocal stair climbing up/down 13 steps with 1 handrail as needed with <3/10 pain and good stability.    Time  6    Period  Weeks    Status  On-going      PT LONG TERM GOAL #4   Title  Patient to demonstrate B LE strength >=4+/5.    Time  6    Period  Weeks    Status  On-going      PT LONG TERM GOAL #5   Title  Patient to demonstrate symmetrical weight shift, knee flexion, and step length with ambulation with LRAD.    Time  6    Period  Weeks    Status  On-going            Plan - 03/01/19 1606    Clinical Impression Statement  Patient arrived late to session, noting that she has been busy at work. Requesting to finish session early today. Worked on standing LE ther-ex for improvement in strength and ROM. Patient able to perform anterior and lateral step up/downs with UE support. Did intermittently demonstration circumduction as compensation on L LE and heavy UE support, but with good effort and tolerance. Demonstrated good form with hip strengthening exercises and requested addition  of today's exercises into HEP. Patient reported understanding of HEP given today and with no complaints at end of session.    Comorbidities  hx colon CA, L UKA 2016, anemia, migraines, gastric bypass, DM,  depression, anxiety, L knee arthroscopy, L ACL repair    Rehab Potential  Good    PT Treatment/Interventions  ADLs/Self Care Home Management;Electrical Stimulation;Cryotherapy;Moist Heat;Balance training;Therapeutic exercise;Therapeutic activities;Functional mobility training;Stair training;Gait training;Ultrasound;Neuromuscular re-education;Patient/family education;Manual techniques;Vasopneumatic Device;Taping;Energy conservation;Dry needling;Passive range of motion;Scar mobilization    PT Next Visit Plan  progress L knee flexion    Consulted and Agree with Plan of Care  Patient       Patient will benefit from skilled therapeutic intervention in order to improve the following deficits and impairments:  Hypomobility, Increased edema, Decreased scar mobility, Decreased knowledge of precautions, Decreased activity tolerance, Decreased strength, Pain, Difficulty walking, Decreased balance, Decreased range of motion, Improper body mechanics, Postural dysfunction, Impaired flexibility  Visit Diagnosis: Acute pain of left knee  Stiffness of left knee, not elsewhere classified  Muscle weakness (generalized)  Difficulty in walking, not elsewhere classified     Problem List Patient Active Problem List   Diagnosis Date Noted  . Status post total left knee replacement 01/29/2019  . Primary osteoarthritis of left knee 01/02/2019  . IDA (iron deficiency anemia) 03/18/2016  . 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 Status post left partial knee replacement 08/05/2014  . Other iron deficiency anemias 02/05/2013  . Gastric bypass status for obesity 02/05/2013    Janene Harvey, PT, DPT 03/01/19 4:09 PM   [facility] [address]  [place], [place], [address] Phone: [phone]   Fax:  [phone]  Name: Brandi Lutz MRN: [id number] Date of Birth: [birth year]

## 2019-03-05 ENCOUNTER — Other Ambulatory Visit: Payer: Self-pay

## 2019-03-05 ENCOUNTER — Ambulatory Visit: Payer: Managed Care, Other (non HMO)

## 2019-03-05 DIAGNOSIS — M25562 Pain in left knee: Secondary | ICD-10-CM

## 2019-03-05 DIAGNOSIS — R262 Difficulty in walking, not elsewhere classified: Secondary | ICD-10-CM

## 2019-03-05 DIAGNOSIS — M25662 Stiffness of left knee, not elsewhere classified: Secondary | ICD-10-CM

## 2019-03-05 DIAGNOSIS — M6281 Muscle weakness (generalized): Secondary | ICD-10-CM

## 2019-03-05 NOTE — Therapy (Addendum)
[facility] [address]  [place], [place], [address] Phone: [phone]   Fax:  [phone]  Physical Therapy Treatment  Patient Details  Name: Brandi Lutz MRN: [id number] Date of Birth: 03-Nov-1975 Referring Provider (PT): Frankey Shown, MD   Encounter Date: 03/05/2019  PT End of Session - 03/05/19 1333    Visit Number  6    Number of Visits  15    Date for PT Re-Evaluation  04/04/19    Authorization Type  [employer]    PT Start Time  [id number]    PT Stop Time  1411    PT Time Calculation (min)  48 min    Activity Tolerance  Patient tolerated treatment well    Behavior During Therapy  [facility] for tasks assessed/performed       Past Medical History:  Diagnosis Date  . Anxiety   . Arthritis   . Cancer ([place])    colon  . Depression    takes Wellbutrin and Zoloft daily  . Diabetes mellitus without complication ([place])    diet and gastric surgery controlled  . Gastric bypass status for obesity 02/05/2013  . Headache   . History of colon polyps    benign  . History of migraine    none since high school  . Joint pain   . Joint swelling   . Other specified iron deficiency anemias 02/05/2013   2 iron infusions since gastric bypass;no abnormal reaction noted  . PONV (postoperative nausea and vomiting)   . 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 APPENDECTOMY    . ARTHROSCOPIC REPAIR ACL Left 20 yrs ago  . CHOLECYSTECTOMY    . COLECTOMY    . COLONOSCOPY    . GASTRIC BYPASS    . herniated bowel repair    . KNEE ARTHROSCOPY Left   . PARTIAL KNEE ARTHROPLASTY Left 08/05/2014   Procedure: LEFT UNICOMPARTMENTAL KNEE;  Surgeon: Marianna Payment, MD;  Location: [facility];  Service: Orthopedics;  Laterality: Left;  . TOTAL KNEE ARTHROPLASTY Left 01/29/2019  . TOTAL KNEE REVISION Left 01/29/2019   Procedure: CONVERSION OF LEFT UNICOMPARTMENTAL KNEE ARTHROPLASTY  TO LEFT TOTAL KNEE ARTHROPLASTY;  Surgeon: Leandrew Koyanagi, MD;  Location: [facility];  Service: Orthopedics;  Laterality: Left;    There were no vitals filed for this visit.  Subjective Assessment - 03/05/19 1332    Subjective  Pt. reporting she is currently focusing on flexion exercises at home for better knee ROM.    Pertinent History  colon CA, L UKA 2016, anemia, migraines, gastric bypass, DM, depression, anxiety, L knee arthroscopy, L ACL repair    Patient Stated Goals  be able to climb the stairs so that i can work from home    Currently in Pain?  Yes    Pain Score  2     Pain Location  Knee    Pain Orientation  Left    Pain Descriptors / Indicators  Aching    Pain Type  Acute pain;Surgical pain    Multiple Pain Sites  No         [facility] PT Assessment - 03/05/19 0001      AROM   AROM Assessment Site  Knee    Right/Left Knee  Left    Left Knee Flexion  75      PROM   PROM Assessment Site  Knee  Right/Left Knee  Left    Left Knee Flexion  81                   [facility] Adult PT Treatment/Exercise - 03/05/19 0001      Knee/Hip Exercises: Stretches   Control and instrumentation engineer reps;30 seconds    Quad Stretch Limitations  prone with strap    Hip Flexor Stretch  Left;2 reps;30 seconds    Hip Flexor Stretch Limitations  mod thomas stretch to tolerance    Knee: Self-Stretch to increase Flexion  Left    Knee: Self-Stretch Limitations  5" x 10 reps lunge flexion stretch with B UE support on TM rail       Knee/Hip Exercises: Aerobic   Recumbent Bike  lvl 1, 6 min -partial rev      Knee/Hip Exercises: Standing   Knee Flexion  Left;10 reps    Knee Flexion Limitations  standing     Functional Squat  15 reps;3 seconds    Functional Squat Limitations  to functional flexion stretch position at L kne chair behind + airex pad       Vasopneumatic   Number Minutes Vasopneumatic   10 minutes    Vasopnuematic Location   Knee    Vasopneumatic Pressure  Medium    Vasopneumatic  Temperature   coldest      Manual Therapy   Manual Therapy  Joint mobilization;Muscle Energy Technique    Manual therapy comments  supine    Joint Mobilization  L patellar mobilizations grade III M/L and up/down- most hypomobility in superior/inferior directions; posterior tibial joint mobs grade IV to tolerance with prolonged L flexion holds    Muscle Energy Technique  L knee flexion contract/relax stretch x 5 rounds with therapist          HEP update: handout issued with pt. Demonstrating understanding: knee flexion lunge stretch with B UE support        PT Short Term Goals - 02/22/19 1749      PT SHORT TERM GOAL #1   Title  Patient to be independent with initial HEP.    Time  3    Period  Weeks    Status  Achieved    Target Date  03/14/19        PT Long Term Goals - 02/22/19 1749      PT LONG TERM GOAL #1   Title  Patient to be independent with advanced HEP.    Time  6    Period  Weeks    Status  On-going      PT LONG TERM GOAL #2   Title  Patient to demonstrate L knee AROM/PROM 0-120 degrees.    Time  6    Period  Weeks    Status  On-going      PT LONG TERM GOAL #3   Title  Patient to demonstrate reciprocal stair climbing up/down 13 steps with 1 handrail as needed with <3/10 pain and good stability.    Time  6    Period  Weeks    Status  On-going      PT LONG TERM GOAL #4   Title  Patient to demonstrate B LE strength >=4+/5.    Time  6    Period  Weeks    Status  On-going      PT LONG TERM GOAL #5   Title  Patient to demonstrate symmetrical weight shift, knee flexion, and step length with ambulation with  LRAD.    Time  6    Period  Weeks    Status  On-going            Plan - 03/05/19 1414    Clinical Impression Statement  Manvir reporting she has had some frustration trying to improve her L knee flexion ROM at home with seated and supine heel slide activities.  Introduced standing knee flexion lunge stretch on step today with UE support and  progressed counter squat for functional knee stretch.  MT with joint mobilizations, patellar mobs, and manual flexion stretch for improved ROM.  Pt. able to demo L knee PROM improvement to 81 dg today following MT and therex stretching.  Pt. progressing well with ROM and ended visit strongly encouraging pt. as to flexion ROM progress for hopeful carryover with HEP.  Pt. HEP updated with weight bearing flexion stretches and ice/compression applied to end session to reduce post-exercise swelling and pain.  Pt. progressing toward LTG #2.    Personal Factors and Comorbidities  Age;Comorbidity 3+;Time since onset of injury/illness/exacerbation;Past/Current Experience;Fitness;Profession    Comorbidities  hx colon CA, L UKA 2016, anemia, migraines, gastric bypass, DM, depression, anxiety, L knee arthroscopy, L ACL repair    Rehab Potential  Good    PT Treatment/Interventions  ADLs/Self Care Home Management;Electrical Stimulation;Cryotherapy;Moist Heat;Balance training;Therapeutic exercise;Therapeutic activities;Functional mobility training;Stair training;Gait training;Ultrasound;Neuromuscular re-education;Patient/family education;Manual techniques;Vasopneumatic Device;Taping;Energy conservation;Dry needling;Passive range of motion;Scar mobilization    PT Next Visit Plan  progress L knee flexion    Consulted and Agree with Plan of Care  Patient       Patient will benefit from skilled therapeutic intervention in order to improve the following deficits and impairments:  Hypomobility, Increased edema, Decreased scar mobility, Decreased knowledge of precautions, Decreased activity tolerance, Decreased strength, Pain, Difficulty walking, Decreased balance, Decreased range of motion, Improper body mechanics, Postural dysfunction, Impaired flexibility  Visit Diagnosis: Acute pain of left knee  Stiffness of left knee, not elsewhere classified  Muscle weakness (generalized)  Difficulty in walking, not elsewhere  classified     Problem List Patient Active Problem List   Diagnosis Date Noted  . Status post total left knee replacement 01/29/2019  . Primary osteoarthritis of left knee 01/02/2019  . IDA (iron deficiency anemia) 03/18/2016  . 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 Status post left partial knee replacement 08/05/2014  . Other iron deficiency anemias 02/05/2013  . Gastric bypass status for obesity 02/05/2013    Bess Harvest, PTA 03/05/19 6:05 PM   [facility] [address]  [place], [place], [address] Phone: [phone]   Fax:  [phone]  Name: Brandi Lutz MRN: [id number] Date of Birth: 1976/02/20

## 2019-03-07 ENCOUNTER — Ambulatory Visit: Payer: Managed Care, Other (non HMO)

## 2019-03-07 ENCOUNTER — Other Ambulatory Visit: Payer: Self-pay

## 2019-03-07 DIAGNOSIS — M6281 Muscle weakness (generalized): Secondary | ICD-10-CM

## 2019-03-07 DIAGNOSIS — M25562 Pain in left knee: Secondary | ICD-10-CM | POA: Diagnosis not present

## 2019-03-07 DIAGNOSIS — R262 Difficulty in walking, not elsewhere classified: Secondary | ICD-10-CM

## 2019-03-07 DIAGNOSIS — M25662 Stiffness of left knee, not elsewhere classified: Secondary | ICD-10-CM

## 2019-03-07 NOTE — Therapy (Signed)
[facility] [address]  [place], [place], [address] Phone: [phone]   Fax:  [phone]  Physical Therapy Treatment  Patient Details  Name: Brandi Lutz MRN: [id number] Date of Birth: [birth year] Referring Provider (PT): Frankey Shown, MD   Encounter Date: 03/07/2019  PT End of Session - 03/07/19 1321    Visit Number  7    Number of Visits  15    Date for PT Re-Evaluation  04/04/19    Authorization Type  [employer]    PT Start Time  1316    PT Stop Time  1413    PT Time Calculation (min)  57 min    Activity Tolerance  Patient tolerated treatment well    Behavior During Therapy  [facility] for tasks assessed/performed       Past Medical History:  Diagnosis Date  . Anxiety   . Arthritis   . Cancer ([place])    colon  . Depression    takes Wellbutrin and Zoloft daily  . Diabetes mellitus without complication ([place])    diet and gastric surgery controlled  . Gastric bypass status for obesity 02/05/2013  . Headache   . History of colon polyps    benign  . History of migraine    none since high school  . Joint pain   . Joint swelling   . Other specified iron deficiency anemias 02/05/2013   2 iron infusions since gastric bypass;no abnormal reaction noted  . PONV (postoperative nausea and vomiting)   . 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 APPENDECTOMY    . ARTHROSCOPIC REPAIR ACL Left 20 yrs ago  . CHOLECYSTECTOMY    . COLECTOMY    . COLONOSCOPY    . GASTRIC BYPASS    . herniated bowel repair    . KNEE ARTHROSCOPY Left   . PARTIAL KNEE ARTHROPLASTY Left 08/05/2014   Procedure: LEFT UNICOMPARTMENTAL KNEE;  Surgeon: Marianna Payment, MD;  Location: [place];  Service: Orthopedics;  Laterality: Left;  . TOTAL KNEE ARTHROPLASTY Left 01/29/2019  . TOTAL KNEE REVISION Left 01/29/2019   Procedure: CONVERSION OF LEFT UNICOMPARTMENTAL KNEE ARTHROPLASTY  TO LEFT TOTAL KNEE ARTHROPLASTY;  Surgeon: Leandrew Koyanagi, MD;  Location: [place];  Service: Orthopedics;  Laterality: Left;    There were no vitals filed for this visit.  Subjective Assessment - 03/07/19 1321    Subjective  Pt. reporting half day of soreness after last session.    Pertinent History  colon CA, L UKA 2016, anemia, migraines, gastric bypass, DM, depression, anxiety, L knee arthroscopy, L ACL repair    Patient Stated Goals  be able to climb the stairs so that i can work from home    Currently in Pain?  No/denies    Pain Score  0-No pain    Pain Location  Knee    Pain Orientation  Left    Pain Descriptors / Indicators  Aching    Pain Type  Acute pain;Chronic pain    Multiple Pain Sites  No         [facility] PT Assessment - 03/07/19 0001      AROM   AROM Assessment Site  Knee    Right/Left Knee  Left    Left Knee Flexion  80      PROM   PROM Assessment Site  Knee    Right/Left Knee  Left  Left Knee Flexion  86                   [facility] Adult PT Treatment/Exercise - 03/07/19 0001      Knee/Hip Exercises: Stretches   Control and instrumentation engineer reps;30 seconds    Quad Stretch Limitations  prone with strap    Hip Flexor Stretch Limitations  mod thomas stretch to tolerance    Knee: Self-Stretch to increase Flexion  Left    Knee: Self-Stretch Limitations  5" x 10 reps lunge flexion stretch with B UE support on TM rail       Knee/Hip Exercises: Aerobic   Recumbent Bike  lvl 1, 6 min -partial rev      Knee/Hip Exercises: Standing   Lateral Step Up  Left;10 reps;Step Height: 6"    Lateral Step Up Limitations  Pt. requesting to perform at home     Forward Step Up  Left;10 reps;Step Height: 6"    Forward Step Up Limitations  Pt. noting improved control     Functional Squat  15 reps;3 seconds    Functional Squat Limitations  to functional flexion stretch position at L kne chair behind + airex pad       Vasopneumatic   Number Minutes Vasopneumatic   10 minutes     Vasopnuematic Location   Knee    Vasopneumatic Pressure  Medium    Vasopneumatic Temperature   coldest      Manual Therapy   Manual Therapy  Joint mobilization;Muscle Energy Technique    Manual therapy comments  supine    Joint Mobilization  L patellar mobilizations grade III M/L and up/down- most hypomobility in superior/inferior directions; posterior tibial joint mobs grade IV to tolerance with prolonged L flexion holds    Muscle Energy Technique  L knee flexion contract/relax stretch x 5 rounds with therapist              PT Education - 03/07/19 1406    Education Details  HEP update : counter squat for functional flexion stretch    Person(s) Educated  Patient    Methods  Explanation;Demonstration;Verbal cues;Handout    Comprehension  Verbalized understanding;Returned demonstration;Verbal cues required       PT Short Term Goals - 02/22/19 1749      PT SHORT TERM GOAL #1   Title  Patient to be independent with initial HEP.    Time  3    Period  Weeks    Status  Achieved    Target Date  03/14/19        PT Long Term Goals - 02/22/19 1749      PT LONG TERM GOAL #1   Title  Patient to be independent with advanced HEP.    Time  6    Period  Weeks    Status  On-going      PT LONG TERM GOAL #2   Title  Patient to demonstrate L knee AROM/PROM 0-120 degrees.    Time  6    Period  Weeks    Status  On-going      PT LONG TERM GOAL #3   Title  Patient to demonstrate reciprocal stair climbing up/down 13 steps with 1 handrail as needed with <3/10 pain and good stability.    Time  6    Period  Weeks    Status  On-going      PT LONG TERM GOAL #4   Title  Patient to demonstrate B LE strength >=  4+/5.    Time  6    Period  Weeks    Status  On-going      PT LONG TERM GOAL #5   Title  Patient to demonstrate symmetrical weight shift, knee flexion, and step length with ambulation with LRAD.    Time  6    Period  Weeks    Status  On-going            Plan -  03/07/19 1326    Clinical Impression Statement  Brandi Lutz doing well today reporting she has been performing standing knee flexion lunge stretch for ROM at home.  MT and therex focused on improving flexion ROM with pt. educated on value of squat and weight bearing activities for creating functional knee flexion stretch with pt. verbalizing understanding.  Pt. able to demo significant improvement in L knee flexion ROM today AROM 81 dg, PROM 86 dg following manual mobs, patellar mobs, functional flexion stretches.  Pt. strongly encouraged to continue aggressive knee flexion ROM activities at home as to progress toward 90 dg flexion by early next week.  Ended visit with ice/compression to L knee to reduce post-exercise swelling and pain.  Pt. gait pattern without AD much improved after skilled instruction for improved weight shift and heel strike with pt. verbalizing she will practice this at home over weekend.   Personal Factors and Comorbidities  Age;Comorbidity 3+;Time since onset of injury/illness/exacerbation;Past/Current Experience;Fitness;Profession    Comorbidities  hx colon CA, L UKA 2016, anemia, migraines, gastric bypass, DM, depression, anxiety, L knee arthroscopy, L ACL repair    Rehab Potential  Good    PT Treatment/Interventions  ADLs/Self Care Home Management;Electrical Stimulation;Cryotherapy;Moist Heat;Balance training;Therapeutic exercise;Therapeutic activities;Functional mobility training;Stair training;Gait training;Ultrasound;Neuromuscular re-education;Patient/family education;Manual techniques;Vasopneumatic Device;Taping;Energy conservation;Dry needling;Passive range of motion;Scar mobilization    PT Next Visit Plan  Functional knee flexion stretching; MT for improved flexion ROM; modalities prn    Consulted and Agree with Plan of Care  Patient       Patient will benefit from skilled therapeutic intervention in order to improve the following deficits and impairments:  Hypomobility,  Increased edema, Decreased scar mobility, Decreased knowledge of precautions, Decreased activity tolerance, Decreased strength, Pain, Difficulty walking, Decreased balance, Decreased range of motion, Improper body mechanics, Postural dysfunction, Impaired flexibility  Visit Diagnosis: Acute pain of left knee  Stiffness of left knee, not elsewhere classified  Muscle weakness (generalized)  Difficulty in walking, not elsewhere classified     Problem List Patient Active Problem List   Diagnosis Date Noted  . Status post total left knee replacement 01/29/2019  . Primary osteoarthritis of left knee 01/02/2019  . IDA (iron deficiency anemia) 03/18/2016  . 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 Status post left partial knee replacement 08/05/2014  . Other iron deficiency anemias 02/05/2013  . Gastric bypass status for obesity 02/05/2013    Bess Harvest, PTA 03/07/19 6:37 PM   [facility] [address]  [place], [place], [address] Phone: [phone]   Fax:  [phone]  Name: Brandi Lutz MRN: [id number] Date of Birth: January 24, 1976

## 2019-03-08 ENCOUNTER — Other Ambulatory Visit: Payer: Self-pay

## 2019-03-08 ENCOUNTER — Encounter: Payer: Self-pay | Admitting: Physical Therapy

## 2019-03-08 ENCOUNTER — Ambulatory Visit: Payer: Managed Care, Other (non HMO) | Admitting: Physical Therapy

## 2019-03-08 DIAGNOSIS — M25562 Pain in left knee: Secondary | ICD-10-CM

## 2019-03-08 DIAGNOSIS — M6281 Muscle weakness (generalized): Secondary | ICD-10-CM

## 2019-03-08 DIAGNOSIS — R262 Difficulty in walking, not elsewhere classified: Secondary | ICD-10-CM

## 2019-03-08 DIAGNOSIS — M25662 Stiffness of left knee, not elsewhere classified: Secondary | ICD-10-CM

## 2019-03-08 NOTE — Therapy (Signed)
[facility] [address]  [place], [place], [address] Phone: [phone]   Fax:  [phone]  Physical Therapy Treatment  Patient Details  Name: Brandi Lutz MRN: [id number] Date of Birth: [birth year] Referring Provider (PT): Frankey Shown, MD   Encounter Date: 03/08/2019  PT End of Session - 03/08/19 1354    Visit Number  8    Number of Visits  15    Date for PT Re-Evaluation  04/04/19    Authorization Type  [employer]    PT Start Time  1315    PT Stop Time  1354    PT Time Calculation (min)  39 min    Activity Tolerance  Patient tolerated treatment well;Patient limited by pain    Behavior During Therapy  [facility] for tasks assessed/performed       Past Medical History:  Diagnosis Date  . Anxiety   . Arthritis   . Cancer ([place])    colon  . Depression    takes Wellbutrin and Zoloft daily  . Diabetes mellitus without complication ([place])    diet and gastric surgery controlled  . Gastric bypass status for obesity 02/05/2013  . Headache   . History of colon polyps    benign  . History of migraine    none since high school  . Joint pain   . Joint swelling   . Other specified iron deficiency anemias 02/05/2013   2 iron infusions since gastric bypass;no abnormal reaction noted  . PONV (postoperative nausea and vomiting)   . 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 APPENDECTOMY    . ARTHROSCOPIC REPAIR ACL Left 20 yrs ago  . CHOLECYSTECTOMY    . COLECTOMY    . COLONOSCOPY    . GASTRIC BYPASS    . herniated bowel repair    . KNEE ARTHROSCOPY Left   . PARTIAL KNEE ARTHROPLASTY Left 08/05/2014   Procedure: LEFT UNICOMPARTMENTAL KNEE;  Surgeon: Marianna Payment, MD;  Location: [place];  Service: Orthopedics;  Laterality: Left;  . TOTAL KNEE ARTHROPLASTY Left 01/29/2019  . TOTAL KNEE REVISION Left 01/29/2019   Procedure: CONVERSION OF LEFT  UNICOMPARTMENTAL KNEE ARTHROPLASTY TO LEFT TOTAL KNEE ARTHROPLASTY;  Surgeon: Leandrew Koyanagi, MD;  Location: [place];  Service: Orthopedics;  Laterality: Left;    There were no vitals filed for this visit.  Subjective Assessment - 03/08/19 1317    Subjective  Doing well. has noticed that she is getting more motion in her knee.    Pertinent History  colon CA, L UKA 2016, anemia, migraines, gastric bypass, DM, depression, anxiety, L knee arthroscopy, L ACL repair    Patient Stated Goals  be able to climb the stairs so that i can work from home    Currently in Pain?  Yes    Pain Score  1     Pain Location  Knee    Pain Orientation  Left    Pain Descriptors / Indicators  Dull;Aching    Pain Type  Acute pain                       [facility] Adult PT Treatment/Exercise - 03/08/19 0001      Knee/Hip Exercises: Aerobic   Recumbent Bike  lvl 1, 6 min -partial rev      Knee/Hip Exercises: Machines for Strengthening   Cybex Knee Extension  B LEs  2x10 5#   c/o difficulty    Cybex Knee Flexion  B LEs 2x10 10#   cues to increased use of L LE     Knee/Hip Exercises: Standing   Forward Step Up  Left;10 reps;Step Height: 6";Hand Hold: 2    Forward Step Up Limitations  2x10; lateral step down; good effort to maintain pressure over L LE    Wall Squat  1 set;10 reps    Wall Squat Limitations  good effort and form      Manual Therapy   Manual Therapy  Joint mobilization;Muscle Energy Technique    Manual therapy comments  supine    Joint Mobilization  L patellar mobilizations grade III M/L and up/down- most hypomobility in superior/inferior directions; posterior tibial joint mobs grade IV to tolerance with prolonged L flexion holds             PT Education - 03/08/19 1354    Education Details  update to HEP    Person(s) Educated  Patient    Methods  Explanation;Demonstration;Tactile cues;Verbal cues;Handout    Comprehension  Verbalized understanding;Returned demonstration        PT Short Term Goals - 02/22/19 1749      PT SHORT TERM GOAL #1   Title  Patient to be independent with initial HEP.    Time  3    Period  Weeks    Status  Achieved    Target Date  03/14/19        PT Long Term Goals - 02/22/19 1749      PT LONG TERM GOAL #1   Title  Patient to be independent with advanced HEP.    Time  6    Period  Weeks    Status  On-going      PT LONG TERM GOAL #2   Title  Patient to demonstrate L knee AROM/PROM 0-120 degrees.    Time  6    Period  Weeks    Status  On-going      PT LONG TERM GOAL #3   Title  Patient to demonstrate reciprocal stair climbing up/down 13 steps with 1 handrail as needed with <3/10 pain and good stability.    Time  6    Period  Weeks    Status  On-going      PT LONG TERM GOAL #4   Title  Patient to demonstrate B LE strength >=4+/5.    Time  6    Period  Weeks    Status  On-going      PT LONG TERM GOAL #5   Title  Patient to demonstrate symmetrical weight shift, knee flexion, and step length with ambulation with LRAD.    Time  6    Period  Weeks    Status  On-going            Plan - 03/08/19 1355    Clinical Impression Statement  Patient reporting no new complaints. Focused beginning of session on manual therapy for improvement in L knee ROM and joint mobility. Manual therapy adjusted to patient's tolerance for improved comfort. Introduced Patent examiner with patient apprehensive to use L LE and with more difficulty with knee extension. Worked on lateral step up/downs with patient demonstrating good effort to maintain weight over L LE throughout. Introduced wall squats with patient demonstrating even weight shift and visible muscle fatigue. Declined modalities and with no complaints at end of session.    Personal Factors and Comorbidities  Age;Comorbidity 3+;Time  since onset of injury/illness/exacerbation;Past/Current Experience;Fitness;Profession    Comorbidities  hx colon CA, L UKA 2016, anemia,  migraines, gastric bypass, DM, depression, anxiety, L knee arthroscopy, L ACL repair    Rehab Potential  Good    PT Treatment/Interventions  ADLs/Self Care Home Management;Electrical Stimulation;Cryotherapy;Moist Heat;Balance training;Therapeutic exercise;Therapeutic activities;Functional mobility training;Stair training;Gait training;Ultrasound;Neuromuscular re-education;Patient/family education;Manual techniques;Vasopneumatic Device;Taping;Energy conservation;Dry needling;Passive range of motion;Scar mobilization    PT Next Visit Plan  Functional knee flexion stretching; MT for improved flexion ROM; modalities prn    Consulted and Agree with Plan of Care  Patient       Patient will benefit from skilled therapeutic intervention in order to improve the following deficits and impairments:  Hypomobility, Increased edema, Decreased scar mobility, Decreased knowledge of precautions, Decreased activity tolerance, Decreased strength, Pain, Difficulty walking, Decreased balance, Decreased range of motion, Improper body mechanics, Postural dysfunction, Impaired flexibility  Visit Diagnosis: Acute pain of left knee  Stiffness of left knee, not elsewhere classified  Muscle weakness (generalized)  Difficulty in walking, not elsewhere classified     Problem List Patient Active Problem List   Diagnosis Date Noted  . Status post total left knee replacement 01/29/2019  . Primary osteoarthritis of left knee 01/02/2019  . IDA (iron deficiency anemia) 03/18/2016  . 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 Status post left partial knee replacement 08/05/2014  . Other iron deficiency anemias 02/05/2013  . Gastric bypass status for obesity 02/05/2013     Janene Harvey, PT, DPT 03/08/19 3:10 PM   [facility] [address]  [place], [place], [address] Phone: [phone]   Fax:  [phone]  Name: Brandi Lutz MRN:  [id number] Date of Birth: Jun 16, 1975

## 2019-03-12 ENCOUNTER — Ambulatory Visit: Payer: Managed Care, Other (non HMO) | Admitting: Physical Therapy

## 2019-03-12 ENCOUNTER — Encounter: Payer: Self-pay | Admitting: Physical Therapy

## 2019-03-12 ENCOUNTER — Other Ambulatory Visit: Payer: Self-pay

## 2019-03-12 DIAGNOSIS — R262 Difficulty in walking, not elsewhere classified: Secondary | ICD-10-CM

## 2019-03-12 DIAGNOSIS — M25562 Pain in left knee: Secondary | ICD-10-CM

## 2019-03-12 DIAGNOSIS — M6281 Muscle weakness (generalized): Secondary | ICD-10-CM

## 2019-03-12 DIAGNOSIS — M25662 Stiffness of left knee, not elsewhere classified: Secondary | ICD-10-CM

## 2019-03-12 NOTE — Therapy (Signed)
[facility] [place] [address]  [place], [place], [address] Phone: [phone]   Fax:  [phone]  Physical Therapy Progress Note  Patient Details  Name: Brandi Lutz MRN: [id number] Date of Birth: 26-Jun-1975 Referring Provider (PT): Frankey Shown, MD   Encounter Date: 03/12/2019  PT End of Session - 03/12/19 1655    Visit Number  9    Number of Visits  15    Date for PT Re-Evaluation  04/04/19    Authorization Type  [employer]    PT Start Time  1316    PT Stop Time  1401    PT Time Calculation (min)  45 min    Activity Tolerance  Patient tolerated treatment well;Patient limited by pain    Behavior During Therapy  [facility] for tasks assessed/performed       Past Medical History:  Diagnosis Date  . Anxiety   . Arthritis   . Cancer ([place])    colon  . Depression    takes Wellbutrin and Zoloft daily  . Diabetes mellitus without complication ([place])    diet and gastric surgery controlled  . Gastric bypass status for obesity 02/05/2013  . Headache   . History of colon polyps    benign  . History of migraine    none since high school  . Joint pain   . Joint swelling   . Other specified iron deficiency anemias 02/05/2013   2 iron infusions since gastric bypass;no abnormal reaction noted  . PONV (postoperative nausea and vomiting)   . 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 APPENDECTOMY    . ARTHROSCOPIC REPAIR ACL Left 20 yrs ago  . CHOLECYSTECTOMY    . COLECTOMY    . COLONOSCOPY    . GASTRIC BYPASS    . herniated bowel repair    . KNEE ARTHROSCOPY Left   . PARTIAL KNEE ARTHROPLASTY Left 08/05/2014   Procedure: LEFT UNICOMPARTMENTAL KNEE;  Surgeon: Marianna Payment, MD;  Location: [facility];  Service: Orthopedics;  Laterality: Left;  . TOTAL KNEE ARTHROPLASTY Left 01/29/2019  . TOTAL KNEE REVISION Left 01/29/2019   Procedure: CONVERSION OF LEFT  UNICOMPARTMENTAL KNEE ARTHROPLASTY TO LEFT TOTAL KNEE ARTHROPLASTY;  Surgeon: Leandrew Koyanagi, MD;  Location: [facility];  Service: Orthopedics;  Laterality: Left;    There were no vitals filed for this visit.  Subjective Assessment - 03/12/19 1317    Subjective  Things are going alright. Was able to go up the stairs, but still having trouble going down. Reports that she has improved in her knee bend, ability to walk without SPC, and edema. Reports 65% improvement. Would like to continued working on stamina, gait, stairs, and knee bend.    Pertinent History  colon CA, L UKA 2016, anemia, migraines, gastric bypass, DM, depression, anxiety, L knee arthroscopy, L ACL repair    Patient Stated Goals  be able to climb the stairs so that i can work from home    Currently in Pain?  Yes    Pain Score  1     Pain Location  Knee    Pain Orientation  Left    Pain Descriptors / Indicators  --   "bone pain"   Pain Type  Acute pain         [facility] PT Assessment - 03/12/19 0001      Observation/Other Assessments   Focus on Therapeutic Outcomes (  FOTO)   Knee: 59 (41 limited, 39% predicted)      AROM   Left Knee Extension  1    Left Knee Flexion  80      PROM   Left Knee Extension  0    Left Knee Flexion  85      Strength   Right Hip Flexion  5/5    Right Hip ABduction  4+/5    Right Hip ADduction  4+/5    Left Hip Flexion  5/5    Left Hip ABduction  4/5    Left Hip ADduction  4/5    Right Knee Flexion  4+/5    Right Knee Extension  5/5    Left Knee Flexion  4/5    Left Knee Extension  4+/5    Right Ankle Dorsiflexion  4+/5    Right Ankle Plantar Flexion  4+/5    Left Ankle Dorsiflexion  4+/5    Left Ankle Plantar Flexion  4+/5                   [facility] Adult PT Treatment/Exercise - 03/12/19 0001      Ambulation/Gait   Ambulation/Gait  Yes    Ambulation/Gait Assistance  7: Independent    Ambulation Distance (Feet)  90 Feet    Assistive device  None    Gait Pattern   Step-through pattern;Decreased hip/knee flexion - left;Decreased weight shift to left;Left flexed knee in stance    Ambulation Surface  Level;Outdoor    Gait velocity  WNL    Stairs  Yes    Stair Management Technique  One rail Right;One rail Left    Number of Stairs  13    Height of Stairs  8    Gait Comments  able to reciprocally ascend with 1 handrail and without circumduction; performing step-to when descending      Knee/Hip Exercises: Seated   Hamstring Curl  Strengthening;Left;1 set;10 reps    Hamstring Limitations  blue TB      Knee/Hip Exercises: Supine   Heel Slides  AAROM;Left;1 set;10 reps    Heel Slides Limitations  10x5" to tolerance with strap and orange pball      Manual Therapy   Manual Therapy  Joint mobilization;Muscle Energy Technique    Manual therapy comments  supine    Joint Mobilization  L patellar mobilizations grade III M/L and up/down- most hypomobility in superior/inferior directions; posterior tibial joint mobs grade IV to tolerance with prolonged L flexion holds             PT Education - 03/12/19 1654    Education Details  review and consolidation of HEP; discussion on objective progress with PT    Person(s) Educated  Patient    Methods  Explanation;Demonstration;Tactile cues;Verbal cues;Handout    Comprehension  Verbalized understanding;Returned demonstration       PT Short Term Goals - 03/12/19 1656      PT SHORT TERM GOAL #1   Title  Patient to be independent with initial HEP.    Time  3    Period  Weeks    Status  Achieved    Target Date  03/14/19        PT Long Term Goals - 03/12/19 1656      PT LONG TERM GOAL #1   Title  Patient to be independent with advanced HEP.    Time  6    Period  Weeks    Status  On-going  met for current     PT LONG TERM GOAL #2   Title  Patient to demonstrate L knee AROM/PROM 0-120 degrees.    Time  6    Period  Weeks    Status  On-going   L knee AROM 1-80 degrees, PROM 0-85 degrees     PT  LONG TERM GOAL #3   Title  Patient to demonstrate reciprocal stair climbing up/down 13 steps with 1 handrail as needed with <3/10 pain and good stability.    Time  6    Period  Weeks    Status  On-going   able to demonstrate step-to R LE dominant pattern when descending stairs, reciprocal pattern when ascending stairs, without SPC and with 1 handrail support     PT LONG TERM GOAL #4   Title  Patient to demonstrate B LE strength >=4+/5.    Time  6    Period  Weeks    Status  Partially Met   improving in B hip flexion, B knee extension, L knee flexion, B ankle dorsiflexion and plantarflexion     PT LONG TERM GOAL #5   Title  Patient to demonstrate symmetrical weight shift, knee flexion, and step length with ambulation with LRAD.    Time  6    Period  Weeks    Status  Partially Met   showing improvement in weight acceptance on L LE and knee flexion, with lack of TKE remaining at heel strike without AD           Plan - 03/12/19 1702    Clinical Impression Statement  Patient reported 65% improvement in L knee since initial eval. Notes that she has improved in her ROM, ability to ambulate without SPC, and amount of edema. Would like to continue working on ROM, gait pattern, and ability to navigate stairs. L knee measured 1-80 degrees AROM and PROM 0-85 degrees. Strength testing revealed improved B hip flexion, B knee extension, L knee flexion, B ankle dorsiflexion and plantarflexion. Patient showing improvement in weight acceptance on L LE and knee flexion with ambulation but still demonstrating lack of TKE at heel strike without AD. Stair navigation is improving- now able to demonstrate reciprocal pattern ascending and step-to pattern descending. Patient tolerated patellar and knee flexion mobilizations with report of discomfort but able to tolerate. Reviewed and consolidated HEP for max benefit with patient reported good understanding. No complaints at end of session. Patient is  demonstrating good improvements with therapy thus far, would continue to benefit from skilled PT services to address remaining impairments and return to PLOF.    Personal Factors and Comorbidities  --    Comorbidities  hx colon CA, L UKA 2016, anemia, migraines, gastric bypass, DM, depression, anxiety, L knee arthroscopy, L ACL repair    Rehab Potential  Good    PT Treatment/Interventions  ADLs/Self Care Home Management;Electrical Stimulation;Cryotherapy;Moist Heat;Balance training;Therapeutic exercise;Therapeutic activities;Functional mobility training;Stair training;Gait training;Ultrasound;Neuromuscular re-education;Patient/family education;Manual techniques;Vasopneumatic Device;Taping;Energy conservation;Dry needling;Passive range of motion;Scar mobilization    PT Next Visit Plan  Functional knee flexion stretching; MT for improved flexion ROM; modalities prn    Consulted and Agree with Plan of Care  Patient       Patient will benefit from skilled therapeutic intervention in order to improve the following deficits and impairments:  Hypomobility, Increased edema, Decreased scar mobility, Decreased knowledge of precautions, Decreased activity tolerance, Decreased strength, Pain, Difficulty walking, Decreased balance, Decreased range of motion, Improper body mechanics, Postural dysfunction, Impaired flexibility  Visit Diagnosis: Acute pain of left knee  Stiffness of left knee, not elsewhere classified  Muscle weakness (generalized)  Difficulty in walking, not elsewhere classified     Problem List Patient Active Problem List   Diagnosis Date Noted  . Status post total left knee replacement 01/29/2019  . Primary osteoarthritis of left knee 01/02/2019  . IDA (iron deficiency anemia) 03/18/2016  . 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 Status post left partial knee replacement 08/05/2014  . Other iron deficiency anemias 02/05/2013  . Gastric bypass status for obesity 02/05/2013      Janene Harvey, PT, DPT 03/12/19 5:12 PM   [facility] [address]  [place], [place], [address] Phone: [phone]   Fax:  [phone]  Name: Brandi Lutz MRN: [id number] Date of Birth: 1975/08/22

## 2019-03-13 ENCOUNTER — Encounter: Payer: Self-pay | Admitting: Orthopaedic Surgery

## 2019-03-13 ENCOUNTER — Ambulatory Visit (INDEPENDENT_AMBULATORY_CARE_PROVIDER_SITE_OTHER): Payer: Managed Care, Other (non HMO) | Admitting: Orthopaedic Surgery

## 2019-03-13 ENCOUNTER — Ambulatory Visit (INDEPENDENT_AMBULATORY_CARE_PROVIDER_SITE_OTHER): Payer: Managed Care, Other (non HMO)

## 2019-03-13 DIAGNOSIS — Z96652 Presence of left artificial knee joint: Secondary | ICD-10-CM

## 2019-03-13 NOTE — Progress Notes (Signed)
Post-Op Visit Note   Patient: Brandi Lutz           Date of Birth: April 29, 1976           MRN: [id number] Visit Date: 03/13/2019 PCP: Benito Mccreedy, MD   Assessment & Plan:  Visit Diagnoses:  1. S/P TKR (total knee replacement), left     Plan: Patient is 6 weeks status post conversion of a partial knee replacement to a total knee replacement.  She states that overall she has been progressing although slowly.  She denies any arthritic pain that she previously had.  [name] making progress with PT although she is frustrated that she is not getting more flexion.  She continues to be very motivated and disciplined with her exercises. On examination her surgical scar is fully healed.  She has moderate swelling.  She has full extension.  She has about 85 degrees of flexion.  I do not feel any physical impediments to more flexion therefore I feel that is likely the swelling and pain that is limiting her range of motion.  Her x-rays demonstrate a stable prosthesis without any problems.  I would like to see her back in another month for recheck.  In the meantime she will continue with aggressive PT and home exercises.    I did encourage her to take tramadol before PT sessions to help facilitate range of motion and participation.  Questions encouraged and answered.  Follow-Up Instructions: Return in about 4 weeks (around 04/10/2019).   Orders:  Orders Placed This Encounter  Procedures  . XR Knee 1-2 Views Left   No orders of the defined types were placed in this encounter.   Imaging: Xr Knee 1-2 Views Left  Result Date: 03/13/2019 Stable total knee replacement in good alignment.    PMFS History: Patient Active Problem List   Diagnosis Date Noted  . Status post total left knee replacement 01/29/2019  . Primary osteoarthritis of left knee 01/02/2019  . IDA (iron deficiency anemia) 03/18/2016  . 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 Status post left partial knee replacement  08/05/2014  . Other iron deficiency anemias 02/05/2013  . Gastric bypass status for obesity 02/05/2013   Past Medical History:  Diagnosis Date  . Anxiety   . Arthritis   . Cancer ([place])    colon  . Depression    takes Wellbutrin and Zoloft daily  . Diabetes mellitus without complication ([place])    diet and gastric surgery controlled  . Gastric bypass status for obesity 02/05/2013  . Headache   . History of colon polyps    benign  . History of migraine    none since high school  . Joint pain   . Joint swelling   . Other specified iron deficiency anemias 02/05/2013   2 iron infusions since gastric bypass;no abnormal reaction noted  . PONV (postoperative nausea and vomiting)   . S/P left unicompartmental knee replacement 08/05/2014    No family history on file.  Past Surgical History:  Procedure Laterality Date  . APPENDECTOMY    . ARTHROSCOPIC REPAIR ACL Left 20 yrs ago  . CHOLECYSTECTOMY    . COLECTOMY    . COLONOSCOPY    . GASTRIC BYPASS    . herniated bowel repair    . KNEE ARTHROSCOPY Left   . PARTIAL KNEE ARTHROPLASTY Left 08/05/2014   Procedure: LEFT UNICOMPARTMENTAL KNEE;  Surgeon: Marianna Payment, MD;  Location: [facility];  Service: Orthopedics;  Laterality: Left;  .  TOTAL KNEE ARTHROPLASTY Left 01/29/2019  . TOTAL KNEE REVISION Left 01/29/2019   Procedure: CONVERSION OF LEFT UNICOMPARTMENTAL KNEE ARTHROPLASTY TO LEFT TOTAL KNEE ARTHROPLASTY;  Surgeon: Leandrew Koyanagi, MD;  Location: [facility];  Service: Orthopedics;  Laterality: Left;   Social History   Occupational History  . Not on file  Tobacco Use  . Smoking status: Never Smoker  . Smokeless tobacco: Never Used  Substance and Sexual Activity  . Alcohol use: Yes    Alcohol/week: 0.0 standard drinks    Comment: rarely  . Drug use: No  . Sexual activity: Not Currently

## 2019-03-14 ENCOUNTER — Other Ambulatory Visit: Payer: Self-pay

## 2019-03-14 ENCOUNTER — Ambulatory Visit: Payer: Managed Care, Other (non HMO)

## 2019-03-14 DIAGNOSIS — M25662 Stiffness of left knee, not elsewhere classified: Secondary | ICD-10-CM

## 2019-03-14 DIAGNOSIS — R262 Difficulty in walking, not elsewhere classified: Secondary | ICD-10-CM

## 2019-03-14 DIAGNOSIS — M25562 Pain in left knee: Secondary | ICD-10-CM | POA: Diagnosis not present

## 2019-03-14 DIAGNOSIS — M6281 Muscle weakness (generalized): Secondary | ICD-10-CM

## 2019-03-14 NOTE — Therapy (Signed)
[facility] [address]  [address] [place], [place], [address] Phone: [phone]   Fax:  [phone]  Physical Therapy Treatment  Patient Details  Name: Brandi Lutz MRN: [id number] Date of Birth: 01/01/1976 Referring Provider (PT): Naiping Xu, MD   Encounter Date: 03/14/2019  PT End of Session - 03/14/19 1323    Visit Number  10    Number of Visits  15    Date for PT Re-Evaluation  04/04/19    Authorization Type  [employer]    PT Start Time  1316    PT Stop Time  1411    PT Time Calculation (min)  55 min    Activity Tolerance  Patient tolerated treatment well;Patient limited by pain    Behavior During Therapy  [facility] for tasks assessed/performed       Past Medical History:  Diagnosis Date  . Anxiety   . Arthritis   . Cancer ([facility])    colon  . Depression    takes Wellbutrin and Zoloft daily  . Diabetes mellitus without complication ([facility])    diet and gastric surgery controlled  . Gastric bypass status for obesity 02/05/2013  . Headache   . History of colon polyps    benign  . History of migraine    none since high school  . Joint pain   . Joint swelling   . Other specified iron deficiency anemias 02/05/2013   2 iron infusions since gastric bypass;no abnormal reaction noted  . PONV (postoperative nausea and vomiting)   . 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 APPENDECTOMY    . ARTHROSCOPIC REPAIR ACL Left 20 yrs ago  . CHOLECYSTECTOMY    . COLECTOMY    . COLONOSCOPY    . GASTRIC BYPASS    . herniated bowel repair    . KNEE ARTHROSCOPY Left   . PARTIAL KNEE ARTHROPLASTY Left 08/05/2014   Procedure: LEFT UNICOMPARTMENTAL KNEE;  Surgeon: Naiping Michael Xu, MD;  [facility];  Service: Orthopedics;  Laterality: Left;  . TOTAL KNEE ARTHROPLASTY Left 01/29/2019  . TOTAL KNEE REVISION Left 01/29/2019   Procedure: CONVERSION OF LEFT  UNICOMPARTMENTAL KNEE ARTHROPLASTY TO LEFT TOTAL KNEE ARTHROPLASTY;  Surgeon: Xu, Naiping M, MD;  Location: [facility];  Service: Orthopedics;  Laterality: Left;    There were no vitals filed for this visit.  Subjective Assessment - 03/14/19 1322    Subjective  reports she was down yesterday after hearing MD say at recent f/u she may need to have a L knee manip for ROM purposes.    Pertinent History  colon CA, L UKA 2016, anemia, migraines, gastric bypass, DM, depression, anxiety, L knee arthroscopy, L ACL repair    Patient Stated Goals  be able to climb the stairs so that i can work from home    Currently in Pain?  Yes    Pain Score  4     Pain Location  Knee    Pain Orientation  Left    Pain Descriptors / Indicators  --   "bone pain"   Pain Type  Acute pain    Pain Onset  More than a month ago    Pain Frequency  Intermittent    Aggravating Factors   end range knee flexion    Pain Relieving Factors  rest    Multiple Pain Sites  No           [facility] PT Assessment - 03/14/19 0001      AROM   AROM Assessment Site  Knee    Right/Left Knee  Left    Left Knee Flexion  82      PROM   PROM Assessment Site  Knee    Right/Left Knee  Left    Left Knee Flexion  91                   [facility] Adult PT Treatment/Exercise - 03/14/19 0001      Knee/Hip Exercises: Stretches   Hip Flexor Stretch  Left;2 reps;30 seconds    Hip Flexor Stretch Limitations  mod thomas stretch to tolerance    Knee: Self-Stretch to increase Flexion  Left    Knee: Self-Stretch Limitations  5" x 10 reps       Knee/Hip Exercises: Aerobic   Recumbent Bike  lvl 1, 6 min -partial rev      Knee/Hip Exercises: Standing   Functional Squat  10 reps;5 seconds    Functional Squat Limitations  functional flexion stretch into knee flexion       Knee/Hip Exercises: Supine   Short Arc Quad Sets  Left;10 reps    Short Arc Quad Sets Limitations  5" hold       Vasopneumatic   Number Minutes Vasopneumatic   15 minutes     Vasopnuematic Location   Knee    Vasopneumatic Pressure  Medium    Vasopneumatic Temperature   coldest      Manual Therapy   Manual Therapy  Joint mobilization;Muscle Energy Technique    Manual therapy comments  supine and seated     Joint Mobilization  L patellar mobilizations grade III M/L and up/down- most hypomobility in superior/inferior directions; posterior tibial joint mobs grade IV with seat belt assistance     Muscle Energy Technique  L knee flexion contract/relax stretch x 5 rounds with therapist                PT Short Term Goals - 03/12/19 1656      PT SHORT TERM GOAL #1   Title  Patient to be independent with initial HEP.    Time  3    Period  Weeks    Status  Achieved    Target Date  03/14/19        PT Long Term Goals - 03/12/19 1656      PT LONG TERM GOAL #1   Title  Patient to be independent with advanced HEP.    Time  6    Period  Weeks    Status  On-going   met for current     PT LONG TERM GOAL #2   Title  Patient to demonstrate L knee AROM/PROM 0-120 degrees.    Time  6    Period  Weeks    Status  On-going   L knee AROM 1-80 degrees, PROM 0-85 degrees     PT LONG TERM GOAL #3   Title  Patient to demonstrate reciprocal stair climbing up/down 13 steps with 1 handrail as needed with <3/10 pain and good stability.    Time  6    Period  Weeks    Status  On-going   able to demonstrate step-to R LE dominant pattern when descending stairs, reciprocal pattern when ascending stairs, without SPC and with 1 handrail support     PT LONG TERM GOAL #4   Title  Patient to demonstrate B LE strength >=  4+/5.    Time  6    Period  Weeks    Status  Partially Met   improving in B hip flexion, B knee extension, L knee flexion, B ankle dorsiflexion and plantarflexion     PT LONG TERM GOAL #5   Title  Patient to demonstrate symmetrical weight shift, knee flexion, and step length with ambulation with LRAD.    Time  6    Period  Weeks    Status  Partially  Met   showing improvement in weight acceptance on L LE and knee flexion, with lack of TKE remaining at heel strike without AD           Plan - 03/14/19 1347    Clinical Impression Statement  Pt. reporting she didn't do much exercise yesterday after getting down about MD instructions that she may need a possible manipulation for ROM of her knee if she cannot make quicker ROM progress.  Session today with aggressive knee flexion MT and therex focused on functional flexion stretching in UE support lunge positioning and increased depth with functional counter squat for flexion ROM.  Seat belt assisted knee flexion A/P mobs initiated today along with aggressive prone contract/relax PNF stretching for quad flexibility for improved flexion ROM.  Pt. progressing PROM when measured today from 85dg when last measured two days ago to 91 dg PROM flexion post-manual therapy.  Pt. with high reported pain levels during end range flexion stretches today however does quickly recover with rest and strongly encouraged by therapist as to good progress with flexion ROM today.  inferior/superior patellar mobility somewhat improved after aggressive patellar mobs today with therapist.  Pt. visually encouraged to end session today and verbalizing she will return to regular HEP flexion focused activities and try to increase her intensity at home.  Ended visit with ice/compression to knee to reduce post-exercise swelling and pain.  Pt. knee flexion ROM progressing.    Personal Factors and Comorbidities  Age;Comorbidity 3+;Time since onset of injury/illness/exacerbation;Past/Current Experience;Fitness;Profession    Comorbidities  hx colon CA, L UKA 2016, anemia, migraines, gastric bypass, DM, depression, anxiety, L knee arthroscopy, L ACL repair    Rehab Potential  Good    PT Treatment/Interventions  ADLs/Self Care Home Management;Electrical Stimulation;Cryotherapy;Moist Heat;Balance training;Therapeutic exercise;Therapeutic  activities;Functional mobility training;Stair training;Gait training;Ultrasound;Neuromuscular re-education;Patient/family education;Manual techniques;Vasopneumatic Device;Taping;Energy conservation;Dry needling;Passive range of motion;Scar mobilization    PT Next Visit Plan  Functional knee flexion stretching; MT for improved flexion ROM; modalities prn    Consulted and Agree with Plan of Care  Patient       Patient will benefit from skilled therapeutic intervention in order to improve the following deficits and impairments:  Hypomobility, Increased edema, Decreased scar mobility, Decreased knowledge of precautions, Decreased activity tolerance, Decreased strength, Pain, Difficulty walking, Decreased balance, Decreased range of motion, Improper body mechanics, Postural dysfunction, Impaired flexibility  Visit Diagnosis: Acute pain of left knee  Stiffness of left knee, not elsewhere classified  Muscle weakness (generalized)  Difficulty in walking, not elsewhere classified     Problem List Patient Active Problem List   Diagnosis Date Noted  . Status post total left knee replacement 01/29/2019  . Primary osteoarthritis of left knee 01/02/2019  . IDA (iron deficiency anemia) 03/18/2016  . 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 Status post left partial knee replacement 08/05/2014  . Other iron deficiency anemias 02/05/2013  . Gastric bypass status for obesity 02/05/2013    Micah Denny, PTA 03/14/19 5:30 PM   [facility] [address]  [address] [place], [place], [address] Phone: [phone]   Fax:  [phone]  Name: Brandi Lutz MRN: [id number] Date of Birth: 12/05/1975   

## 2019-03-15 ENCOUNTER — Ambulatory Visit: Payer: Managed Care, Other (non HMO)

## 2019-03-20 ENCOUNTER — Ambulatory Visit: Payer: Managed Care, Other (non HMO) | Attending: Physician Assistant

## 2019-03-20 ENCOUNTER — Other Ambulatory Visit: Payer: Self-pay

## 2019-03-20 DIAGNOSIS — M25662 Stiffness of left knee, not elsewhere classified: Secondary | ICD-10-CM | POA: Insufficient documentation

## 2019-03-20 DIAGNOSIS — R262 Difficulty in walking, not elsewhere classified: Secondary | ICD-10-CM | POA: Diagnosis present

## 2019-03-20 DIAGNOSIS — M6281 Muscle weakness (generalized): Secondary | ICD-10-CM | POA: Diagnosis present

## 2019-03-20 DIAGNOSIS — M25562 Pain in left knee: Secondary | ICD-10-CM | POA: Diagnosis present

## 2019-03-20 NOTE — Therapy (Signed)
[facility] [address]  [place], [place], [address] Phone: [phone]   Fax:  [phone]  Physical Therapy Treatment  Patient Details  Name: Brandi Lutz MRN: [id number] Date of Birth: [birth year] Referring Provider (PT): Frankey Shown, MD   Encounter Date: 03/20/2019  PT End of Session - 03/20/19 1329    Visit Number  11    Number of Visits  15    Date for PT Re-Evaluation  04/04/19    Authorization Type  [employer]    PT Start Time  9021    PT Stop Time  1410    PT Time Calculation (min)  53 min    Activity Tolerance  Patient tolerated treatment well;Patient limited by pain    Behavior During Therapy  [facility] for tasks assessed/performed       Past Medical History:  Diagnosis Date  . Anxiety   . Arthritis   . Cancer ([place])    colon  . Depression    takes Wellbutrin and Zoloft daily  . Diabetes mellitus without complication ([place])    diet and gastric surgery controlled  . Gastric bypass status for obesity 02/05/2013  . Headache   . History of colon polyps    benign  . History of migraine    none since high school  . Joint pain   . Joint swelling   . Other specified iron deficiency anemias 02/05/2013   2 iron infusions since gastric bypass;no abnormal reaction noted  . PONV (postoperative nausea and vomiting)   . 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 APPENDECTOMY    . ARTHROSCOPIC REPAIR ACL Left 20 yrs ago  . CHOLECYSTECTOMY    . COLECTOMY    . COLONOSCOPY    . GASTRIC BYPASS    . herniated bowel repair    . KNEE ARTHROSCOPY Left   . PARTIAL KNEE ARTHROPLASTY Left 08/05/2014   Procedure: LEFT UNICOMPARTMENTAL KNEE;  Surgeon: Marianna Payment, MD;  Location: [facility];  Service: Orthopedics;  Laterality: Left;  . TOTAL KNEE ARTHROPLASTY Left 01/29/2019  . TOTAL KNEE REVISION Left 01/29/2019   Procedure: CONVERSION OF LEFT  UNICOMPARTMENTAL KNEE ARTHROPLASTY TO LEFT TOTAL KNEE ARTHROPLASTY;  Surgeon: Leandrew Koyanagi, MD;  Location: [facility];  Service: Orthopedics;  Laterality: Left;    There were no vitals filed for this visit.  Subjective Assessment - 03/20/19 1321    Subjective  Pt. reporting she feels her L knee flexion may be better today.    Pertinent History  colon CA, L UKA 2016, anemia, migraines, gastric bypass, DM, depression, anxiety, L knee arthroscopy, L ACL repair    Patient Stated Goals  be able to climb the stairs so that i can work from home    Currently in Pain?  Yes    Pain Score  4     Pain Location  Knee    Pain Orientation  Left    Pain Descriptors / Indicators  --   "bone pain"   Pain Type  Acute pain    Pain Onset  More than a month ago    Pain Frequency  Intermittent    Aggravating Factors   end range knee flexion    Pain Relieving Factors  rest    Multiple Pain Sites  No         [facility] PT Assessment - 03/20/19 0001  Assessment   Medical Diagnosis  s/p L TKA    Referring Provider (PT)  Frankey Shown, MD    Onset Date/Surgical Date  01/29/19    Next MD Visit  04/10/19    Prior Therapy  yes      AROM   AROM Assessment Site  Knee    Right/Left Knee  Left    Left Knee Flexion  85      PROM   PROM Assessment Site  Knee    Right/Left Knee  Left    Left Knee Flexion  94                   [facility] Adult PT Treatment/Exercise - 03/20/19 0001      Knee/Hip Exercises: Stretches   Control and instrumentation engineer reps;30 seconds    Quad Stretch Limitations  prone with strap + bolster under thigh     Hip Flexor Stretch  Left;2 reps;30 seconds    Hip Flexor Stretch Limitations  mod thomas stretch to tolerance    Knee: Self-Stretch to increase Flexion  Left    Knee: Self-Stretch Limitations  5" x 10 reps standing at counter and stool       Knee/Hip Exercises: Aerobic   Recumbent Bike  lvl 1, 6 min -partial rev      Vasopneumatic   Number Minutes Vasopneumatic   10 minutes     Vasopnuematic Location   Knee    Vasopneumatic Pressure  Medium    Vasopneumatic Temperature   coldest      Manual Therapy   Manual Therapy  Joint mobilization;Muscle Energy Technique    Manual therapy comments  supine and seated     Joint Mobilization  L patellar mobilizations grade III M/L and up/down- most hypomobility in superior/inferior directions; posterior tibial joint mobs grade IV with seat belt assistance     Soft tissue mobilization  Scar massage with pt. instructed in proper technique     Muscle Energy Technique  L knee flexion contract/relax stretch x 5 rounds with therapist                PT Short Term Goals - 03/12/19 1656      PT SHORT TERM GOAL #1   Title  Patient to be independent with initial HEP.    Time  3    Period  Weeks    Status  Achieved    Target Date  03/14/19        PT Long Term Goals - 03/12/19 1656      PT LONG TERM GOAL #1   Title  Patient to be independent with advanced HEP.    Time  6    Period  Weeks    Status  On-going   met for current     PT LONG TERM GOAL #2   Title  Patient to demonstrate L knee AROM/PROM 0-120 degrees.    Time  6    Period  Weeks    Status  On-going   L knee AROM 1-80 degrees, PROM 0-85 degrees     PT LONG TERM GOAL #3   Title  Patient to demonstrate reciprocal stair climbing up/down 13 steps with 1 handrail as needed with <3/10 pain and good stability.    Time  6    Period  Weeks    Status  On-going   able to demonstrate step-to R LE dominant pattern when descending stairs, reciprocal pattern when ascending stairs, without SPC and with  1 handrail support     PT LONG TERM GOAL #4   Title  Patient to demonstrate B LE strength >=4+/5.    Time  6    Period  Weeks    Status  Partially Met   improving in B hip flexion, B knee extension, L knee flexion, B ankle dorsiflexion and plantarflexion     PT LONG TERM GOAL #5   Title  Patient to demonstrate symmetrical weight shift, knee flexion, and step  length with ambulation with LRAD.    Time  6    Period  Weeks    Status  Partially Met   showing improvement in weight acceptance on L LE and knee flexion, with lack of TKE remaining at heel strike without AD           Plan - 03/20/19 1341    Clinical Impression Statement  Session focused on exercises to promote flexion ROM both standing weight bearing knee flexion stretches and supine LE flexibility activities.  MT focused on improving patellar mobility, L knee A/P mobs, and seatbelt assisted mobs for improved flexion ROM.  Nicloe able to demo 3 dg improvement in L knee flexion since last visit with PROM flexion to 94 dg and has been making steady flexion ROM progress over last few visits.  Pt. seems to understand urgency of focusing on improving her flexion ROM at home and verbalizing more aggressive flexion-based stretching at home.  Ended visit with ice/compression to L knee to reduce swelling and pain.  Progressing toward LTG #2.    Personal Factors and Comorbidities  Age;Comorbidity 3+;Time since onset of injury/illness/exacerbation;Past/Current Experience;Fitness;Profession    Examination-Activity Limitations  Bathing;Sit;Bed Mobility;Sleep;Bend;Squat;Caring for Others;Stairs;Carry;Stand;Toileting;Dressing;Transfers;Lift;Locomotion Level    Rehab Potential  Good    PT Treatment/Interventions  ADLs/Self Care Home Management;Electrical Stimulation;Cryotherapy;Moist Heat;Balance training;Therapeutic exercise;Therapeutic activities;Functional mobility training;Stair training;Gait training;Ultrasound;Neuromuscular re-education;Patient/family education;Manual techniques;Vasopneumatic Device;Taping;Energy conservation;Dry needling;Passive range of motion;Scar mobilization    PT Next Visit Plan  Functional knee flexion stretching; MT for improved flexion ROM; modalities prn    Consulted and Agree with Plan of Care  Patient       Patient will benefit from skilled therapeutic intervention in  order to improve the following deficits and impairments:  Hypomobility, Increased edema, Decreased scar mobility, Decreased knowledge of precautions, Decreased activity tolerance, Decreased strength, Pain, Difficulty walking, Decreased balance, Decreased range of motion, Improper body mechanics, Postural dysfunction, Impaired flexibility  Visit Diagnosis: Acute pain of left knee  Stiffness of left knee, not elsewhere classified  Muscle weakness (generalized)  Difficulty in walking, not elsewhere classified     Problem List Patient Active Problem List   Diagnosis Date Noted  . Status post total left knee replacement 01/29/2019  . Primary osteoarthritis of left knee 01/02/2019  . IDA (iron deficiency anemia) 03/18/2016  . 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 Status post left partial knee replacement 08/05/2014  . Other iron deficiency anemias 02/05/2013  . Gastric bypass status for obesity 02/05/2013    Bess Harvest, PTA 03/20/19 [facility] [address]  [place], [place], [address] Phone: [phone]   Fax:  [phone]  Name: VERLYN DANNENBERG MRN: [id number] Date of Birth: [birth year]

## 2019-03-23 ENCOUNTER — Other Ambulatory Visit: Payer: Self-pay

## 2019-03-23 ENCOUNTER — Ambulatory Visit: Payer: Managed Care, Other (non HMO)

## 2019-03-23 DIAGNOSIS — M25662 Stiffness of left knee, not elsewhere classified: Secondary | ICD-10-CM

## 2019-03-23 DIAGNOSIS — M6281 Muscle weakness (generalized): Secondary | ICD-10-CM

## 2019-03-23 DIAGNOSIS — M25562 Pain in left knee: Secondary | ICD-10-CM

## 2019-03-23 DIAGNOSIS — R262 Difficulty in walking, not elsewhere classified: Secondary | ICD-10-CM

## 2019-03-23 NOTE — Therapy (Signed)
[facility] [address]  [place], [place], [address] Phone: [phone]   Fax:  [phone]  Physical Therapy Treatment  Patient Details  Name: Brandi Lutz MRN: [id number] Date of Birth: [birth year] Referring Provider (PT): Frankey Shown, MD   Encounter Date: 03/23/2019  PT End of Session - 03/23/19 0812    Visit Number  12    Number of Visits  15    Date for PT Re-Evaluation  04/04/19    Authorization Type  [employer]ign[employer]    PT Start Time  0801    PT Stop Time  0840    PT Time Calculation (min)  39 min    Activity Tolerance  Patient tolerated treatment well;Patient limited by pain    Behavior During Therapy  [facility] for tasks assessed/performed       Past Medical History:  Diagnosis Date  . Anxiety   . Arthritis   . Cancer ([place])    colon  . Depression    takes Wellbutrin and Zoloft daily  . Diabetes mellitus without complication ([place])    diet and gastric surgery controlled  . Gastric bypass status for obesity 02/05/2013  . Headache   . History of colon polyps    benign  . History of migraine    none since high school  . Joint pain   . Joint swelling   . Other specified iron deficiency anemias 02/05/2013   2 iron infusions since gastric bypass;no abnormal reaction noted  . PONV (postoperative nausea and vomiting)   . 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 APPENDECTOMY    . ARTHROSCOPIC REPAIR ACL Left 20 yrs ago  . CHOLECYSTECTOMY    . COLECTOMY    . COLONOSCOPY    . GASTRIC BYPASS    . herniated bowel repair    . KNEE ARTHROSCOPY Left   . PARTIAL KNEE ARTHROPLASTY Left 08/05/2014   Procedure: LEFT UNICOMPARTMENTAL KNEE;  Surgeon: Marianna Payment, MD;  Location: [facility];  Service: Orthopedics;  Laterality: Left;  . TOTAL KNEE ARTHROPLASTY Left 01/29/2019  . TOTAL KNEE REVISION Left 01/29/2019   Procedure: CONVERSION OF LEFT  UNICOMPARTMENTAL KNEE ARTHROPLASTY TO LEFT TOTAL KNEE ARTHROPLASTY;  Surgeon: Leandrew Koyanagi, MD;  Location: [facility];  Service: Orthopedics;  Laterality: Left;    There were no vitals filed for this visit.  Subjective Assessment - 03/23/19 0810    Subjective  Pt. reporting she has been aggressively working on her flexion ROM at home.    Pertinent History  colon CA, L UKA 2016, anemia, migraines, gastric bypass, DM, depression, anxiety, L knee arthroscopy, L ACL repair    Patient Stated Goals  be able to climb the stairs so that i can work from home    Currently in Pain?  Yes    Pain Score  3     Pain Location  Knee    Pain Orientation  Left    Pain Descriptors / Indicators  --   "bone pain"   Pain Type  Acute pain    Pain Onset  More than a month ago    Pain Frequency  Intermittent    Aggravating Factors   end range knee flexion    Multiple Pain Sites  No         [facility] PT Assessment - 03/23/19 0001      Assessment   Next MD Visit  04/10/19      AROM   AROM Assessment Site  Knee    Right/Left Knee  Left    Left Knee Flexion  94      PROM   PROM Assessment Site  Knee    Right/Left Knee  Left    Left Knee Flexion  98                   [facility] Adult PT Treatment/Exercise - 03/23/19 0001      Knee/Hip Exercises: Stretches   Control and instrumentation engineer reps;30 seconds    Quad Stretch Limitations  prone with strap + bolster under thigh     Hip Flexor Stretch  Left;2 reps;30 seconds    Hip Flexor Stretch Limitations  mod thomas stretch to tolerance    Knee: Self-Stretch to increase Flexion  Left    Knee: Self-Stretch Limitations  5" x 10 reps standing at counter and stool       Knee/Hip Exercises: Aerobic   Recumbent Bike  lvl 1, 6 min -partial rev      Knee/Hip Exercises: Machines for Strengthening   Cybex Leg Press  B LE's: 25# x 15 resp       Knee/Hip Exercises: Supine   Short Arc Quad Sets  Left;10 reps    Short Arc Quad Sets Limitations  5" hold    Straight Leg  Raises  Left;10 reps    Straight Leg Raises Limitations  2#      Manual Therapy   Manual Therapy  Joint mobilization;Muscle Energy Technique    Manual therapy comments  supine, standing      Muscle Energy Technique  L knee flexion contract/relax stretch x 5 rounds with therapist                PT Short Term Goals - 03/12/19 1656      PT SHORT TERM GOAL #1   Title  Patient to be independent with initial HEP.    Time  3    Period  Weeks    Status  Achieved    Target Date  03/14/19        PT Long Term Goals - 03/12/19 1656      PT LONG TERM GOAL #1   Title  Patient to be independent with advanced HEP.    Time  6    Period  Weeks    Status  On-going   met for current     PT LONG TERM GOAL #2   Title  Patient to demonstrate L knee AROM/PROM 0-120 degrees.    Time  6    Period  Weeks    Status  On-going   L knee AROM 1-80 degrees, PROM 0-85 degrees     PT LONG TERM GOAL #3   Title  Patient to demonstrate reciprocal stair climbing up/down 13 steps with 1 handrail as needed with <3/10 pain and good stability.    Time  6    Period  Weeks    Status  On-going   able to demonstrate step-to R LE dominant pattern when descending stairs, reciprocal pattern when ascending stairs, without SPC and with 1 handrail support     PT LONG TERM GOAL #4   Title  Patient to demonstrate B LE strength >=4+/5.    Time  6    Period  Weeks    Status  Partially Met   improving in B hip flexion, B knee extension, L knee flexion, B  ankle dorsiflexion and plantarflexion     PT LONG TERM GOAL #5   Title  Patient to demonstrate symmetrical weight shift, knee flexion, and step length with ambulation with LRAD.    Time  6    Period  Weeks    Status  Partially Met   showing improvement in weight acceptance on L LE and knee flexion, with lack of TKE remaining at heel strike without AD           Plan - 03/23/19 0844    Clinical Impression Statement  Pt. noting she needed to leave  early from session today thus modalities deferred at end of session.  Able to demo improved L knee AROM flexion to 94 dg, PROM flexion 98 dg today following heavy manual therapy and functional knee flexion stretching with therex.  Pt. reporting she has been more aggressive with HEP stretches and seemed encouraged about knee flexion progress to end visit.  Pt. very aware of upcoming MD visit at end of month and knows she needs to improve her flexion ROM.  Strongly encouraged pt. to continue being aggressive with standing lunge stretch with B UE support and functional squat at home and reviewed proper technique with superior/inferior patellar mobs for pt. home performance.  Pt. verbalized/demo'd understanding.  Pt. leaving session pain free.  Pt. progressing well toward LTG #2.    Comorbidities  hx colon CA, L UKA 2016, anemia, migraines, gastric bypass, DM, depression, anxiety, L knee arthroscopy, L ACL repair    Rehab Potential  Good    PT Treatment/Interventions  ADLs/Self Care Home Management;Electrical Stimulation;Cryotherapy;Moist Heat;Balance training;Therapeutic exercise;Therapeutic activities;Functional mobility training;Stair training;Gait training;Ultrasound;Neuromuscular re-education;Patient/family education;Manual techniques;Vasopneumatic Device;Taping;Energy conservation;Dry needling;Passive range of motion;Scar mobilization    PT Next Visit Plan  Functional knee flexion stretching; MT for improved flexion ROM; modalities prn    Consulted and Agree with Plan of Care  Patient       Patient will benefit from skilled therapeutic intervention in order to improve the following deficits and impairments:  Hypomobility, Increased edema, Decreased scar mobility, Decreased knowledge of precautions, Decreased activity tolerance, Decreased strength, Pain, Difficulty walking, Decreased balance, Decreased range of motion, Improper body mechanics, Postural dysfunction, Impaired flexibility  Visit  Diagnosis: Acute pain of left knee  Stiffness of left knee, not elsewhere classified  Muscle weakness (generalized)  Difficulty in walking, not elsewhere classified     Problem List Patient Active Problem List   Diagnosis Date Noted  . Status post total left knee replacement 01/29/2019  . Primary osteoarthritis of left knee 01/02/2019  . IDA (iron deficiency anemia) 03/18/2016  . 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 Status post left partial knee replacement 08/05/2014  . Other iron deficiency anemias 02/05/2013  . Gastric bypass status for obesity 02/05/2013    Bess Harvest, PTA 03/23/19 9:48 AM   [facility] [address]  [place], [place], [address] Phone: [phone]   Fax:  [phone]  Name: RUDENE POULSEN MRN: [id number] Date of Birth: May 09, 1976

## 2019-03-26 ENCOUNTER — Ambulatory Visit (INDEPENDENT_AMBULATORY_CARE_PROVIDER_SITE_OTHER): Payer: Managed Care, Other (non HMO) | Admitting: Family Medicine

## 2019-03-26 ENCOUNTER — Ambulatory Visit: Payer: Managed Care, Other (non HMO)

## 2019-03-26 ENCOUNTER — Encounter: Payer: Self-pay | Admitting: Family Medicine

## 2019-03-26 ENCOUNTER — Ambulatory Visit: Payer: Self-pay

## 2019-03-26 ENCOUNTER — Other Ambulatory Visit: Payer: Self-pay

## 2019-03-26 ENCOUNTER — Telehealth: Payer: Self-pay | Admitting: Orthopaedic Surgery

## 2019-03-26 DIAGNOSIS — Z96652 Presence of left artificial knee joint: Secondary | ICD-10-CM

## 2019-03-26 DIAGNOSIS — M25562 Pain in left knee: Secondary | ICD-10-CM

## 2019-03-26 NOTE — Progress Notes (Signed)
Office Visit Note   Patient: Brandi Lutz           Date of Birth: 25-Nov-1975           MRN: [id number] Visit Date: 03/26/2019 Requested by: Benito Mccreedy, MD [id number] [place],  [place] [address] PCP: Benito Mccreedy, MD  Subjective: Chief Complaint  Patient presents with  . Left Knee - Pain    Fell this morning - walking on wet leaves. Did not fall on knee, but the left leg twisted out to the side and the right leg buckled under her. The left knee is feeling tight. Walking with a slight limp - grabbed the can to use today.    HPI: [name] here with left knee pain.  This morning she was walking on wet leaves, slipped and fell with her left knee twisting sideways and her right knee buckling under her.  Able to get back up and walk, but has had pain on the medial side of her knee since then.  [name] 2 months status post left knee replacement and is doing physical therapy to work on range of motion, has flexion of 98 degrees.  [name] trying to avoid manipulation.              ROS: No fever or chills.  All other systems were reviewed and are negative.  Objective: Vital Signs: There were no vitals taken for this visit.  Physical Exam:  General:  Alert and oriented, in no acute distress. Pulm:  Breathing unlabored. Psy:  Normal mood, congruent affect. Skin: No bruising Left knee: 1+ effusion with no warmth.  Tender on the medial joint line.  No laxity of varus or valgus stress.  Full active extension and flexion of around 100 degrees.  Imaging: X-rays left knee: Surgical hardware is intact, no sign of loosening or fracture.  Assessment & Plan: 1.  Status post fall with left knee pain, no obvious fracture seen. -Reassurance, continue with physical therapy.  Follow-up in 2 weeks as previously scheduled with Dr. Erlinda Hong.     Procedures: No procedures performed  No notes on file     PMFS History: Patient Active Problem List   Diagnosis Date Noted  .  Status post total left knee replacement 01/29/2019  . Primary osteoarthritis of left knee 01/02/2019  . IDA (iron deficiency anemia) 03/18/2016  . 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 Chronic diarrhea 09/11/2015  . Family history of colon cancer 09/11/2015  . History of colon polyps 09/11/2015  . Status post left partial knee replacement 08/05/2014  . Other iron deficiency anemias 02/05/2013  . Gastric bypass status for obesity 02/05/2013   Past Medical History:  Diagnosis Date  . Anxiety   . Arthritis   . Cancer ([place])    colon  . Depression    takes Wellbutrin and Zoloft daily  . Diabetes mellitus without complication ([place])    diet and gastric surgery controlled  . Gastric bypass status for obesity 02/05/2013  . Headache   . History of colon polyps    benign  . History of migraine    none since high school  . Joint pain   . Joint swelling   . Other specified iron deficiency anemias 02/05/2013   2 iron infusions since gastric bypass;no abnormal reaction noted  . PONV (postoperative nausea and vomiting)   . S/P left unicompartmental knee replacement 08/05/2014    History reviewed. No pertinent family history.  Past  Surgical History:  Procedure Laterality Date  . APPENDECTOMY    . ARTHROSCOPIC REPAIR ACL Left 20 yrs ago  . CHOLECYSTECTOMY    . COLECTOMY    . COLONOSCOPY    . GASTRIC BYPASS    . herniated bowel repair    . KNEE ARTHROSCOPY Left   . PARTIAL KNEE ARTHROPLASTY Left 08/05/2014   Procedure: LEFT UNICOMPARTMENTAL KNEE;  Surgeon: Marianna Payment, MD;  Location: [facility];  Service: Orthopedics;  Laterality: Left;  . TOTAL KNEE ARTHROPLASTY Left 01/29/2019  . TOTAL KNEE REVISION Left 01/29/2019   Procedure: CONVERSION OF LEFT UNICOMPARTMENTAL KNEE ARTHROPLASTY TO LEFT TOTAL KNEE ARTHROPLASTY;  Surgeon: Leandrew Koyanagi, MD;  Location: [facility];  Service: Orthopedics;  Laterality: Left;   Social History   Occupational History  . Not on file  Tobacco Use  . Smoking  status: Never Smoker  . Smokeless tobacco: Never Used  Substance and Sexual Activity  . Alcohol use: Yes    Alcohol/week: 0.0 standard drinks    Comment: rarely  . Drug use: No  . Sexual activity: Not Currently

## 2019-03-29 ENCOUNTER — Ambulatory Visit: Payer: Managed Care, Other (non HMO)

## 2019-03-29 ENCOUNTER — Other Ambulatory Visit: Payer: Self-pay

## 2019-03-29 DIAGNOSIS — M25662 Stiffness of left knee, not elsewhere classified: Secondary | ICD-10-CM

## 2019-03-29 DIAGNOSIS — M25562 Pain in left knee: Secondary | ICD-10-CM

## 2019-03-29 DIAGNOSIS — R262 Difficulty in walking, not elsewhere classified: Secondary | ICD-10-CM

## 2019-03-29 DIAGNOSIS — M6281 Muscle weakness (generalized): Secondary | ICD-10-CM

## 2019-03-29 NOTE — Therapy (Addendum)
[facility] [address]  [place], [place], [address] Phone: [phone]   Fax:  [phone]  Physical Therapy Treatment  Patient Details  Name: Brandi Lutz MRN: [id number] Date of Birth: 1975-07-14 Referring Provider (PT): Frankey Shown, MD   Encounter Date: 03/29/2019  PT End of Session - 03/29/19 1322    Visit Number  13    Number of Visits  15    Date for PT Re-Evaluation  04/04/19    Authorization Type  [employer]    PT Start Time  1315    PT Stop Time  1400    PT Time Calculation (min)  45 min    Activity Tolerance  Patient tolerated treatment well;Patient limited by pain    Behavior During Therapy  [facility] for tasks assessed/performed       Past Medical History:  Diagnosis Date  . Anxiety   . Arthritis   . Cancer ([place])    colon  . Depression    takes Wellbutrin and Zoloft daily  . Diabetes mellitus without complication ([place])    diet and gastric surgery controlled  . Gastric bypass status for obesity 02/05/2013  . Headache   . History of colon polyps    benign  . History of migraine    none since high school  . Joint pain   . Joint swelling   . Other specified iron deficiency anemias 02/05/2013   2 iron infusions since gastric bypass;no abnormal reaction noted  . PONV (postoperative nausea and vomiting)   . 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 APPENDECTOMY    . ARTHROSCOPIC REPAIR ACL Left 20 yrs ago  . CHOLECYSTECTOMY    . COLECTOMY    . COLONOSCOPY    . GASTRIC BYPASS    . herniated bowel repair    . KNEE ARTHROSCOPY Left   . PARTIAL KNEE ARTHROPLASTY Left 08/05/2014   Procedure: LEFT UNICOMPARTMENTAL KNEE;  Surgeon: Marianna Payment, MD;  Location: [facility];  Service: Orthopedics;  Laterality: Left;  . TOTAL KNEE ARTHROPLASTY Left 01/29/2019  . TOTAL KNEE REVISION Left 01/29/2019   Procedure: CONVERSION OF LEFT  UNICOMPARTMENTAL KNEE ARTHROPLASTY TO LEFT TOTAL KNEE ARTHROPLASTY;  Surgeon: Leandrew Koyanagi, MD;  Location: [facility];  Service: Orthopedics;  Laterality: Left;    There were no vitals filed for this visit.  Subjective Assessment - 03/29/19 1317    Subjective  Pt. noting she fell on Monday in her garage after slipping on "wet leaves" and did not lange on L knee.  Has had some L knee soreness since then which has improved.  Saw MD after fall with X-rays negative for fracture.    Pertinent History  colon CA, L UKA 2016, anemia, migraines, gastric bypass, DM, depression, anxiety, L knee arthroscopy, L ACL repair    Patient Stated Goals  be able to climb the stairs so that i can work from home    Currently in Pain?  Yes    Pain Score  4     Pain Location  Knee    Pain Orientation  Left    Pain Descriptors / Indicators  Sore    Pain Type  Acute pain    Pain Onset  More than a month ago    Aggravating Factors   End range knee flexion    Pain Relieving Factors  rest    Multiple Pain Sites  No         [facility] PT Assessment - 03/29/19 0001      Assessment   Medical Diagnosis  s/p L TKA    Referring Provider (PT)  Frankey Shown, MD    Onset Date/Surgical Date  01/29/19    Next MD Visit  04/10/19    Prior Therapy  yes      AROM   AROM Assessment Site  Knee    Right/Left Knee  Left    Left Knee Flexion  95      PROM   PROM Assessment Site  Knee    Right/Left Knee  Left    Left Knee Flexion  100                   [facility] Adult PT Treatment/Exercise - 03/29/19 0001      Ambulation/Gait   Ambulation/Gait  --    Ambulation/Gait Assistance  --    Stairs  Yes    Stairs Assistance  5: Supervision    Stair Management Technique  One rail Right;Alternating pattern;Step to pattern    Number of Stairs  13    Height of Stairs  8    Gait Comments  Cues for step-over step pattern - most difficulty descending       Knee/Hip Exercises: Stretches   Sports administrator  Left;1 rep;60 seconds     Quad Stretch Limitations  prone with strap + bolster under thigh     Hip Flexor Stretch  Left;2 reps;30 seconds    Hip Flexor Stretch Limitations  mod thomas stretch to tolerance    Knee: Self-Stretch to increase Flexion  Left    Knee: Self-Stretch Limitations  5" x 10 reps standing at counter and stool     ITB Stretch  Left;1 rep;30 seconds    ITB Stretch Limitations  strap       Knee/Hip Exercises: Aerobic   Recumbent Bike  lvl 1, 6 min -partial rev      Knee/Hip Exercises: Standing   Lateral Step Up  Left;10 reps;Step Height: 6"    Lateral Step Up Limitations  with TM UE support     Forward Step Up  Left;10 reps;Step Height: 6";Hand Hold: 1    Forward Step Up Limitations  with TM UE support     Other Standing Knee Exercises  sidestepping with yellow TB around ankles 2 x 30 ft      Manual Therapy   Manual Therapy  Joint mobilization;Muscle Energy Technique;Soft tissue mobilization    Manual therapy comments  supine, standing      Joint Mobilization  L patellar mobilizations grade III M/L and up/down     Soft tissue mobilization  strumming to quad in mod thomas position in area of tenderness near proximal incision     Muscle Energy Technique  L knee flexion contract/relax stretch x 5 rounds with therapist              PT Education - 03/29/19 1452    Education Details  HEP update; side stepping with yellow TB issued to pt.,    Person(s) Educated  Patient    Methods  Explanation;Demonstration;Verbal cues;Handout    Comprehension  Verbalized understanding;Returned demonstration;Verbal cues required       PT Short Term Goals - 03/12/19 1656      PT SHORT TERM GOAL #1   Title  Patient to be independent with initial HEP.    Time  3    Period  Weeks    Status  Achieved    Target Date  03/14/19        PT Long Term Goals - 03/12/19 1656      PT LONG TERM GOAL #1   Title  Patient to be independent with advanced HEP.    Time  6    Period  Weeks    Status  On-going    met for current     PT LONG TERM GOAL #2   Title  Patient to demonstrate L knee AROM/PROM 0-120 degrees.    Time  6    Period  Weeks    Status  On-going   L knee AROM 1-80 degrees, PROM 0-85 degrees     PT LONG TERM GOAL #3   Title  Patient to demonstrate reciprocal stair climbing up/down 13 steps with 1 handrail as needed with <3/10 pain and good stability.    Time  6    Period  Weeks    Status  On-going   able to demonstrate step-to R LE dominant pattern when descending stairs, reciprocal pattern when ascending stairs, without SPC and with 1 handrail support     PT LONG TERM GOAL #4   Title  Patient to demonstrate B LE strength >=4+/5.    Time  6    Period  Weeks    Status  Partially Met   improving in B hip flexion, B knee extension, L knee flexion, B ankle dorsiflexion and plantarflexion     PT LONG TERM GOAL #5   Title  Patient to demonstrate symmetrical weight shift, knee flexion, and step length with ambulation with LRAD.    Time  6    Period  Weeks    Status  Partially Met   showing improvement in weight acceptance on L LE and knee flexion, with lack of TKE remaining at heel strike without AD           Plan - 03/29/19 1341    Clinical Impression Statement  Brandi Lutz reporting she slipped and fell in her garage on Monday and denies landing on L knee.  Saw MD later that day and did X-rays which were negative for fracture.  Pt. reporting she just has some bruising on R knee and hip today.  Tolerated continued focus on L knee flexion ROM exercises in standing with functional flexion and MT + LE stretching.  Pt. able to demo improved L knee ROM to PROM 100 dg and AROM flexion of 95 dg.  Pt. reports she was too sore to perform flexion focused HEP at home however strongly encouraged to perform HEP daily now that soreness is improving for continued flexion progress in upcoming week.  Pt. verbalized her next personal short-term goal is to bend the knee at 110 dg over next few  weeks.  Pt. denies need for ice as she left session today as she had somewhere she needed to be.  Will continue to progress toward LTG #2 for increased flexion ROM.    Personal Factors and Comorbidities  Age;Comorbidity 3+;Time since onset of injury/illness/exacerbation;Past/Current Experience;Fitness;Profession    Comorbidities  hx colon CA, L UKA 2016, anemia, migraines, gastric bypass, DM, depression, anxiety, L knee arthroscopy, L ACL repair    Rehab Potential  Good    PT Treatment/Interventions  ADLs/Self Care Home Management;Electrical Stimulation;Cryotherapy;Moist Heat;Balance training;Therapeutic exercise;Therapeutic activities;Functional mobility training;Stair training;Gait training;Ultrasound;Neuromuscular re-education;Patient/family education;Manual techniques;Vasopneumatic Device;Taping;Energy conservation;Dry needling;Passive range of motion;Scar mobilization    PT Next Visit Plan  Functional knee  flexion stretching; MT for improved flexion ROM; modalities prn    Consulted and Agree with Plan of Care  Patient       Patient will benefit from skilled therapeutic intervention in order to improve the following deficits and impairments:  Hypomobility, Increased edema, Decreased scar mobility, Decreased knowledge of precautions, Decreased activity tolerance, Decreased strength, Pain, Difficulty walking, Decreased balance, Decreased range of motion, Improper body mechanics, Postural dysfunction, Impaired flexibility  Visit Diagnosis: Acute pain of left knee  Stiffness of left knee, not elsewhere classified  Muscle weakness (generalized)  Difficulty in walking, not elsewhere classified     Problem List Patient Active Problem List   Diagnosis Date Noted  . Status post total left knee replacement 01/29/2019  . Primary osteoarthritis of left knee 01/02/2019  . IDA (iron deficiency anemia) 03/18/2016  . 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 Chronic diarrhea 09/11/2015  . Family  history of colon cancer 09/11/2015  . History of colon polyps 09/11/2015  . Status post left partial knee replacement 08/05/2014  . Other iron deficiency anemias 02/05/2013  . Gastric bypass status for obesity 02/05/2013    Bess Harvest, PTA 03/29/19 2:53 PM   [facility] [address]  [place], [place], [address] Phone: [phone]   Fax:  [phone]  Name: Brandi Lutz MRN: [id number] Date of Birth: [birth year]

## 2019-04-02 ENCOUNTER — Encounter: Payer: Self-pay | Admitting: Physical Therapy

## 2019-04-02 ENCOUNTER — Other Ambulatory Visit: Payer: Self-pay

## 2019-04-02 ENCOUNTER — Ambulatory Visit: Payer: Managed Care, Other (non HMO) | Admitting: Physical Therapy

## 2019-04-02 DIAGNOSIS — M25562 Pain in left knee: Secondary | ICD-10-CM | POA: Diagnosis not present

## 2019-04-02 DIAGNOSIS — R262 Difficulty in walking, not elsewhere classified: Secondary | ICD-10-CM

## 2019-04-02 DIAGNOSIS — M6281 Muscle weakness (generalized): Secondary | ICD-10-CM

## 2019-04-02 DIAGNOSIS — M25662 Stiffness of left knee, not elsewhere classified: Secondary | ICD-10-CM

## 2019-04-02 NOTE — Therapy (Signed)
[facility] [address]  [place], [place], [address] Phone: [phone]   Fax:  [phone]  Physical Therapy Treatment  Patient Details  Name: Brandi Lutz MRN: [id number] Date of Birth: 14-Feb-1976 Referring Provider (Brandi Lutz): Frankey Shown, MD   Encounter Date: 04/02/2019  Brandi Lutz End of Session - 04/02/19 1443    Visit Number  14    Number of Visits  15    Date for Brandi Lutz Re-Evaluation  04/04/19    Authorization Type  [employer]    Brandi Lutz Start Time  1400    Brandi Lutz Stop Time  1442    Brandi Lutz Time Calculation (min)  42 min    Activity Tolerance  Patient tolerated treatment well;Patient limited by pain    Behavior During Therapy  [facility] for tasks assessed/performed       Past Medical History:  Diagnosis Date  . Anxiety   . Arthritis   . Cancer ([place])    colon  . Depression    takes Wellbutrin and Zoloft daily  . Diabetes mellitus without complication ([place])    diet and gastric surgery controlled  . Gastric bypass status for obesity 02/05/2013  . Headache   . History of colon polyps    benign  . History of migraine    none since high school  . Joint pain   . Joint swelling   . Other specified iron deficiency anemias 02/05/2013   2 iron infusions since gastric bypass;no abnormal reaction noted  . PONV (postoperative nausea and vomiting)   . 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 APPENDECTOMY    . ARTHROSCOPIC REPAIR ACL Left 20 yrs ago  . CHOLECYSTECTOMY    . COLECTOMY    . COLONOSCOPY    . GASTRIC BYPASS    . herniated bowel repair    . KNEE ARTHROSCOPY Left   . PARTIAL KNEE ARTHROPLASTY Left 08/05/2014   Procedure: LEFT UNICOMPARTMENTAL KNEE;  Surgeon: Marianna Payment, MD;  Location: [facility];  Service: Orthopedics;  Laterality: Left;  . TOTAL KNEE ARTHROPLASTY Left 01/29/2019  . TOTAL KNEE REVISION Left 01/29/2019   Procedure: CONVERSION OF LEFT  UNICOMPARTMENTAL KNEE ARTHROPLASTY TO LEFT TOTAL KNEE ARTHROPLASTY;  Surgeon: Leandrew Koyanagi, MD;  Location: [facility];  Service: Orthopedics;  Laterality: Left;    There were no vitals filed for this visit.  Subjective Assessment - 04/02/19 1402    Subjective  Feeling unsteady on her feet since she fell- having trouble getting the knee straight during gait. L knee was swollen after fall, but is getting better.    Pertinent History  colon CA, L UKA 2016, anemia, migraines, gastric bypass, DM, depression, anxiety, L knee arthroscopy, L ACL repair    Patient Stated Goals  be able to climb the stairs so that i can work from home    Currently in Pain?  No/denies         [facility] Brandi Lutz Assessment - 04/02/19 0001      AROM   Left Knee Extension  2    Left Knee Flexion  95      PROM   Left Knee Extension  0    Left Knee Flexion  100                   [facility] Adult Brandi Lutz Treatment/Exercise - 04/02/19 0001      Knee/Hip Exercises: Stretches   Passive  Hamstring Stretch  Left;2 reps;30 seconds    Passive Hamstring Stretch Limitations  supine with strap    Hip Flexor Stretch  Left;2 reps;30 seconds    Hip Flexor Stretch Limitations  mod thomas stretch to tolerance      Knee/Hip Exercises: Aerobic   Recumbent Bike  lvl 1, 6 min -partial rev      Knee/Hip Exercises: Standing   Step Down  Left;1 set;10 reps;Hand Hold: 2;Step Height: 4"    Step Down Limitations  lateral step downs; heavy UE support on counter and visible quad shaking      Knee/Hip Exercises: Supine   Quad Sets  AROM;Left;1 set;10 reps    Quad Sets Limitations  10x5" with 1/2 bolster under ankles      Manual Therapy   Manual Therapy  Joint mobilization;Muscle Energy Technique;Soft tissue mobilization    Manual therapy comments  supine    Joint Mobilization  L patellar mobilizations grade III M/L and up/down- good mobility; L knee extension mobs grade III/IV with 1/2 bolster under ankles; L knee flexion seatbelt mobs IV to  tolerance             Brandi Lutz Education - 04/02/19 1443    Education Details  update to HEP    Person(s) Educated  Patient    Methods  Explanation;Demonstration;Tactile cues;Verbal cues;Handout    Comprehension  Verbalized understanding;Returned demonstration       Brandi Lutz Short Term Goals - 03/12/19 1656      Brandi Lutz SHORT TERM GOAL #1   Title  Patient to be independent with initial HEP.    Time  3    Period  Weeks    Status  Achieved    Target Date  03/14/19        Brandi Lutz Long Term Goals - 03/12/19 1656      Brandi Lutz LONG TERM GOAL #1   Title  Patient to be independent with advanced HEP.    Time  6    Period  Weeks    Status  On-going   met for current     Brandi Lutz LONG TERM GOAL #2   Title  Patient to demonstrate L knee AROM/PROM 0-120 degrees.    Time  6    Period  Weeks    Status  On-going   L knee AROM 1-80 degrees, PROM 0-85 degrees     Brandi Lutz LONG TERM GOAL #3   Title  Patient to demonstrate reciprocal stair climbing up/down 13 steps with 1 handrail as needed with <3/10 pain and good stability.    Time  6    Period  Weeks    Status  On-going   able to demonstrate step-to R LE dominant pattern when descending stairs, reciprocal pattern when ascending stairs, without SPC and with 1 handrail support     Brandi Lutz LONG TERM GOAL #4   Title  Patient to demonstrate B LE strength >=4+/5.    Time  6    Period  Weeks    Status  Partially Met   improving in B hip flexion, B knee extension, L knee flexion, B ankle dorsiflexion and plantarflexion     Brandi Lutz LONG TERM GOAL #5   Title  Patient to demonstrate symmetrical weight shift, knee flexion, and step length with ambulation with LRAD.    Time  6    Period  Weeks    Status  Partially Met   showing improvement in weight acceptance on L LE and knee flexion, with lack of  TKE remaining at heel strike without AD           Plan - 04/02/19 1443    Clinical Impression Statement  Patient reporting more difficulty extending L knee during ambulation  since recent fall, causing her to feel less steady on her feet. Advised patient to increase ice and elevation practice at home to help with edema. Worked on achieving full knee extension with patellar and knee extension mobs as well as hamstring stretching. Patient able to demonstrate 2-95 degrees AROM and 0-100 degrees PROM after manual therapy. Patient demonstrating quad shaking and heavy UE support on counter top with lateral step downs. Updated HEP with this exercise as it was well-tolerated today. Patient reported understanding. Declined modalities and with no complaints at end of session.    Personal Factors and Comorbidities  Age;Comorbidity 3+;Time since onset of injury/illness/exacerbation;Past/Current Experience;Fitness;Profession    Comorbidities  hx colon CA, L UKA 2016, anemia, migraines, gastric bypass, DM, depression, anxiety, L knee arthroscopy, L ACL repair    Rehab Potential  Good    Brandi Lutz Treatment/Interventions  ADLs/Self Care Home Management;Electrical Stimulation;Cryotherapy;Moist Heat;Balance training;Therapeutic exercise;Therapeutic activities;Functional mobility training;Stair training;Gait training;Ultrasound;Neuromuscular re-education;Patient/family education;Manual techniques;Vasopneumatic Device;Taping;Energy conservation;Dry needling;Passive range of motion;Scar mobilization    Brandi Lutz Next Visit Plan  Functional knee flexion stretching; MT for improved flexion ROM; modalities prn    Consulted and Agree with Plan of Care  Patient       Patient will benefit from skilled therapeutic intervention in order to improve the following deficits and impairments:  Hypomobility, Increased edema, Decreased scar mobility, Decreased knowledge of precautions, Decreased activity tolerance, Decreased strength, Pain, Difficulty walking, Decreased balance, Decreased range of motion, Improper body mechanics, Postural dysfunction, Impaired flexibility  Visit Diagnosis: Acute pain of left  knee  Stiffness of left knee, not elsewhere classified  Muscle weakness (generalized)  Difficulty in walking, not elsewhere classified     Problem List Patient Active Problem List   Diagnosis Date Noted  . Status post total left knee replacement 01/29/2019  . Primary osteoarthritis of left knee 01/02/2019  . IDA (iron deficiency anemia) 03/18/2016  . 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 Chronic diarrhea 09/11/2015  . Family history of colon cancer 09/11/2015  . History of colon polyps 09/11/2015  . Status post left partial knee replacement 08/05/2014  . Other iron deficiency anemias 02/05/2013  . Gastric bypass status for obesity 02/05/2013     Brandi Lutz, Brandi Lutz, Brandi Lutz 04/02/19 2:48 PM   [facility] [address]  [place], [place], [address] Phone: [phone]   Fax:  [phone]  Name: TRENIYAH LYNN MRN: [id number] Date of Birth: 1976/04/29

## 2019-04-05 ENCOUNTER — Telehealth: Payer: Self-pay | Admitting: Orthopaedic Surgery

## 2019-04-05 ENCOUNTER — Ambulatory Visit: Payer: Managed Care, Other (non HMO) | Admitting: Physical Therapy

## 2019-04-05 ENCOUNTER — Encounter: Payer: Self-pay | Admitting: Physical Therapy

## 2019-04-05 ENCOUNTER — Other Ambulatory Visit: Payer: Self-pay

## 2019-04-05 DIAGNOSIS — M6281 Muscle weakness (generalized): Secondary | ICD-10-CM

## 2019-04-05 DIAGNOSIS — R262 Difficulty in walking, not elsewhere classified: Secondary | ICD-10-CM

## 2019-04-05 DIAGNOSIS — M25662 Stiffness of left knee, not elsewhere classified: Secondary | ICD-10-CM

## 2019-04-05 DIAGNOSIS — M25562 Pain in left knee: Secondary | ICD-10-CM | POA: Diagnosis not present

## 2019-04-05 NOTE — Therapy (Signed)
[facility] [address]  [place], [place], [address] Phone: [phone]   Fax:  [phone]  Physical Therapy Treatment  Patient Details  Name: Brandi Lutz MRN: [id number] Date of Birth: 05/08/1976 Referring Provider (PT): Frankey Shown, MD   Encounter Date: 04/05/2019  PT End of Session - 04/05/19 1821    Visit Number  15    Number of Visits  23    Date for PT Re-Evaluation  05/03/19    Authorization Type  [employer]    PT Start Time  7482    PT Stop Time  1356    PT Time Calculation (min)  39 min    Activity Tolerance  Patient tolerated treatment well;Patient limited by pain    Behavior During Therapy  [facility] for tasks assessed/performed       Past Medical History:  Diagnosis Date  . Anxiety   . Arthritis   . Cancer ([place])    colon  . Depression    takes Wellbutrin and Zoloft daily  . Diabetes mellitus without complication ([place])    diet and gastric surgery controlled  . Gastric bypass status for obesity 02/05/2013  . Headache   . History of colon polyps    benign  . History of migraine    none since high school  . Joint pain   . Joint swelling   . Other specified iron deficiency anemias 02/05/2013   2 iron infusions since gastric bypass;no abnormal reaction noted  . PONV (postoperative nausea and vomiting)   . 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 APPENDECTOMY    . ARTHROSCOPIC REPAIR ACL Left 20 yrs ago  . CHOLECYSTECTOMY    . COLECTOMY    . COLONOSCOPY    . GASTRIC BYPASS    . herniated bowel repair    . KNEE ARTHROSCOPY Left   . PARTIAL KNEE ARTHROPLASTY Left 08/05/2014   Procedure: LEFT UNICOMPARTMENTAL KNEE;  Surgeon: Marianna Payment, MD;  Location: [facility];  Service: Orthopedics;  Laterality: Left;  . TOTAL KNEE ARTHROPLASTY Left 01/29/2019  . TOTAL KNEE REVISION Left 01/29/2019   Procedure: CONVERSION OF LEFT  UNICOMPARTMENTAL KNEE ARTHROPLASTY TO LEFT TOTAL KNEE ARTHROPLASTY;  Surgeon: Leandrew Koyanagi, MD;  Location: [facility];  Service: Orthopedics;  Laterality: Left;    There were no vitals filed for this visit.  Subjective Assessment - 04/05/19 1318    Subjective  Her MD's office called to reschedule her appointment to 11/03. Reports 85% improvement in L knee. Would like to work on ROM, strength, and take walks.    Pertinent History  colon CA, L UKA 2016, anemia, migraines, gastric bypass, DM, depression, anxiety, L knee arthroscopy, L ACL repair    Patient Stated Goals  be able to climb the stairs so that i can work from home    Currently in Pain?  Yes    Pain Score  2     Pain Location  Knee    Pain Orientation  Left;Lateral    Pain Descriptors / Indicators  --   stiffness   Pain Type  Acute pain         [facility] PT Assessment - 04/05/19 0001      Assessment   Medical Diagnosis  s/p L TKA    Referring Provider (PT)  Frankey Shown, MD    Onset Date/Surgical Date  01/29/19  Observation/Other Assessments   Focus on Therapeutic Outcomes (FOTO)   Knee: 62 (38 limited, 39% predicted)      AROM   Left Knee Extension  2    Left Knee Flexion  96      PROM   Left Knee Extension  0    Left Knee Flexion  102      Strength   Right Hip Flexion  5/5    Right Hip ABduction  4+/5    Right Hip ADduction  4+/5    Left Hip Flexion  5/5    Left Hip ABduction  4+/5   discomfort   Left Hip ADduction  4+/5    Right Knee Flexion  5/5    Right Knee Extension  5/5    Left Knee Flexion  3+/5   medial knee pain   Left Knee Extension  5/5    Right Ankle Dorsiflexion  4+/5    Right Ankle Plantar Flexion  4+/5    Left Ankle Dorsiflexion  4+/5    Left Ankle Plantar Flexion  4+/5                   [facility] Adult PT Treatment/Exercise - 04/05/19 0001      Ambulation/Gait   Stairs  Yes    Stairs Assistance  5: Supervision    Stair Management Technique  One rail Right;Alternating  pattern;Step to pattern    Number of Stairs  13    Height of Stairs  8    Gait Comments  attempted alternating reciprocal pattern when descending, but limited by pain; able to step reciprocally up stairs      Knee/Hip Exercises: Stretches   Hip Flexor Stretch  Left;2 reps;30 seconds    Hip Flexor Stretch Limitations  mod thomas stretch to tolerance      Knee/Hip Exercises: Aerobic   Recumbent Bike  lvl 1, 6 min -partial rev      Manual Therapy   Manual Therapy  Joint mobilization;Muscle Energy Technique;Soft tissue mobilization    Manual therapy comments  supine    Joint Mobilization  L patellar mobilizations grade III M/L and up/down- good mobility; L knee extension mobs grade III/IV with 1/2 bolster under ankles; L proximal tibial mobs grade III/IV for knee flexion with prolonged knee flexion holds             PT Education - 04/05/19 1820    Education Details  update to HEP; discussion on objective progress with PT thus far as well as remaining impairments; advised patient to return to [facility] use while outside as to avoid changes to gait pattern d/t fear of falling    Person(s) Educated  Patient    Methods  Explanation;Demonstration;Tactile cues;Verbal cues;Handout    Comprehension  Verbalized understanding;Returned demonstration       PT Short Term Goals - 04/05/19 1826      PT SHORT TERM GOAL #1   Title  Patient to be independent with initial HEP.    Time  3    Period  Weeks    Status  Achieved    Target Date  03/14/19        PT Long Term Goals - 04/05/19 1826      PT LONG TERM GOAL #1   Title  Patient to be independent with advanced HEP.    Time  4    Period  Weeks    Status  On-going   met for current   Target Date  05/03/19      PT LONG TERM GOAL #2   Title  Patient to demonstrate L knee AROM/PROM 0-120 degrees.    Time  4    Period  Weeks    Status  On-going   AROM 2-96 degrees and PROM 0-102 degrees   Target Date  05/03/19      PT LONG TERM GOAL #3    Title  Patient to demonstrate reciprocal stair climbing up/down 13 steps with 1 handrail as needed with <3/10 pain and good stability.    Time  4    Period  Weeks    Status  On-going   still performing step-to when descending d/t pain   Target Date  05/03/19      PT LONG TERM GOAL #4   Title  Patient to demonstrate B LE strength >=4+/5.    Time  4    Period  Weeks    Status  Partially Met   improvements in L hip abduction/adduction, L knee extension, and R knee flexion strength- L knee flexion strength now most limiting   Target Date  05/03/19      PT LONG TERM GOAL #5   Title  Patient to demonstrate symmetrical weight shift, knee flexion, and step length with ambulation with LRAD.    Time  4    Period  Weeks    Status  Partially Met   demonstrating decreased step length on R LE with ambulation without AD d/t fear of falling   Target Date  05/03/19            Plan - 04/05/19 1825    Clinical Impression Statement  Patient reported 85% improvement in L knee since initial eval. Would like to continue working on her ROM, strength, and ability to walk outside. Patient has demonstrated improvements in L hip abduction/adduction, L knee extension, and R knee flexion strength- L knee flexion strength now most limiting. Patient has now reached L knee AROM 2-96 degrees and PROM 0-102 degrees. Good patellar and tibiofemoral mobility demonstrated throughout manual therapy today, suggesting that patient's limited ROM is more d/t limited pain tolerance rather than limited mobility. Patient also limited by pain while descending stairs, thus resorting to performing step-to patient when descending, and alternating reciprocal pattern when ascending. Patient is now also demonstrating decreased step length on R LE with ambulation without AD d/t fear of falling, but able to consciously self-correct. Advised patient to return to [facility] use while outside as to avoid changes to gait pattern d/t fear of  falling, and to remove SPC once comfortable. Updated HEP with HS curl in order to address remaining weakness. Patient reported understanding and with no complaints at end of session> patient showing slow but stead progress, with recent progress slowed by patient's recent fall and limited pain tolerance. Would benefit from additional skilled PT services 2x/week for 4 weeks to address remaining goals.    Personal Factors and Comorbidities  Age;Comorbidity 3+;Time since onset of injury/illness/exacerbation;Past/Current Experience;Fitness;Profession    Comorbidities  hx colon CA, L UKA 2016, anemia, migraines, gastric bypass, DM, depression, anxiety, L knee arthroscopy, L ACL repair    Rehab Potential  Good    PT Frequency  2x / week    PT Duration  4 weeks    PT Treatment/Interventions  ADLs/Self Care Home Management;Electrical Stimulation;Cryotherapy;Moist Heat;Balance training;Therapeutic exercise;Therapeutic activities;Functional mobility training;Stair training;Gait training;Ultrasound;Neuromuscular re-education;Patient/family education;Manual techniques;Vasopneumatic Device;Taping;Energy conservation;Dry needling;Passive range of motion;Scar mobilization    PT Next Visit Plan  Functional knee  flexion stretching; MT for improved flexion ROM; modalities prn    Consulted and Agree with Plan of Care  Patient       Patient will benefit from skilled therapeutic intervention in order to improve the following deficits and impairments:  Hypomobility, Increased edema, Decreased scar mobility, Decreased knowledge of precautions, Decreased activity tolerance, Decreased strength, Pain, Difficulty walking, Decreased balance, Decreased range of motion, Improper body mechanics, Postural dysfunction, Impaired flexibility  Visit Diagnosis: Acute pain of left knee  Stiffness of left knee, not elsewhere classified  Muscle weakness (generalized)  Difficulty in walking, not elsewhere classified     Problem  List Patient Active Problem List   Diagnosis Date Noted  . Status post total left knee replacement 01/29/2019  . Primary osteoarthritis of left knee 01/02/2019  . IDA (iron deficiency anemia) 03/18/2016  . 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 Chronic diarrhea 09/11/2015  . Family history of colon cancer 09/11/2015  . History of colon polyps 09/11/2015  . Status post left partial knee replacement 08/05/2014  . Other iron deficiency anemias 02/05/2013  . Gastric bypass status for obesity 02/05/2013     Janene Harvey, PT, DPT 04/05/19 6:29 PM   [facility] [address]  [place], [place], [address] Phone: [phone]   Fax:  [phone]  Name: Brandi Lutz MRN: [id number] Date of Birth: [birth year]

## 2019-04-05 NOTE — Telephone Encounter (Signed)
Please advise 

## 2019-04-05 NOTE — Telephone Encounter (Signed)
Dr. Erlinda Hong will need to evaluate her before decides on manipulation.  We can extend std until f/u appt

## 2019-04-05 NOTE — Telephone Encounter (Signed)
I called pt to reschedule her appt for 10/27 due to the move but pt explained that she needs to know if dr.xu can extend her short term disability and also determine next steps for pt. She would also like to know if dr.xu is going to want to proceed with doing the manipulation on the pt and physical therapy after? Pt says she needs to know this ASAP for her employer since that is what her appt was for on 10/27 but has now been moved to 11/3.    [phone]

## 2019-04-10 ENCOUNTER — Ambulatory Visit: Payer: Managed Care, Other (non HMO) | Admitting: Orthopaedic Surgery

## 2019-04-10 ENCOUNTER — Other Ambulatory Visit: Payer: Self-pay

## 2019-04-10 ENCOUNTER — Encounter: Payer: Self-pay | Admitting: Physical Therapy

## 2019-04-10 ENCOUNTER — Ambulatory Visit: Payer: Managed Care, Other (non HMO) | Admitting: Physical Therapy

## 2019-04-10 DIAGNOSIS — M25562 Pain in left knee: Secondary | ICD-10-CM | POA: Diagnosis not present

## 2019-04-10 DIAGNOSIS — R262 Difficulty in walking, not elsewhere classified: Secondary | ICD-10-CM

## 2019-04-10 DIAGNOSIS — M6281 Muscle weakness (generalized): Secondary | ICD-10-CM

## 2019-04-10 DIAGNOSIS — M25662 Stiffness of left knee, not elsewhere classified: Secondary | ICD-10-CM

## 2019-04-10 NOTE — Therapy (Signed)
[facility] [address]  [place], [place], [address] Phone: [phone]   Fax:  [phone]  Physical Therapy Treatment  Patient Details  Name: Brandi Lutz MRN: [id number] Date of Birth: 1976-05-19 Referring Provider (PT): Frankey Shown, MD   Encounter Date: 04/10/2019  PT End of Session - 04/10/19 1530    Visit Number  16    Number of Visits  23    Date for PT Re-Evaluation  05/03/19    Authorization Type  [employer]    PT Start Time  1404    PT Stop Time  1443    PT Time Calculation (min)  39 min    Activity Tolerance  Patient tolerated treatment well;Patient limited by pain    Behavior During Therapy  [facility] for tasks assessed/performed       Past Medical History:  Diagnosis Date  . Anxiety   . Arthritis   . Cancer ([place])    colon  . Depression    takes Wellbutrin and Zoloft daily  . Diabetes mellitus without complication ([place])    diet and gastric surgery controlled  . Gastric bypass status for obesity 02/05/2013  . Headache   . History of colon polyps    benign  . History of migraine    none since high school  . Joint pain   . Joint swelling   . Other specified iron deficiency anemias 02/05/2013   2 iron infusions since gastric bypass;no abnormal reaction noted  . PONV (postoperative nausea and vomiting)   . 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 APPENDECTOMY    . ARTHROSCOPIC REPAIR ACL Left 20 yrs ago  . CHOLECYSTECTOMY    . COLECTOMY    . COLONOSCOPY    . GASTRIC BYPASS    . herniated bowel repair    . KNEE ARTHROSCOPY Left   . PARTIAL KNEE ARTHROPLASTY Left 08/05/2014   Procedure: LEFT UNICOMPARTMENTAL KNEE;  Surgeon: Marianna Payment, MD;  Location: [facility];  Service: Orthopedics;  Laterality: Left;  . TOTAL KNEE ARTHROPLASTY Left 01/29/2019  . TOTAL KNEE REVISION Left 01/29/2019   Procedure: CONVERSION OF LEFT  UNICOMPARTMENTAL KNEE ARTHROPLASTY TO LEFT TOTAL KNEE ARTHROPLASTY;  Surgeon: Leandrew Koyanagi, MD;  Location: [facility];  Service: Orthopedics;  Laterality: Left;    There were no vitals filed for this visit.  Subjective Assessment - 04/10/19 1405    Subjective  Feeling like her knee is stiffening up on her this afternoon. Notices that weather changes flare her up.    Pertinent History  colon CA, L UKA 2016, anemia, migraines, gastric bypass, DM, depression, anxiety, L knee arthroscopy, L ACL repair    Patient Stated Goals  be able to climb the stairs so that i can work from home    Currently in Pain?  Yes    Pain Score  3     Pain Location  Knee    Pain Orientation  Left    Pain Descriptors / Indicators  Aching    Pain Type  Acute pain                       [facility] Adult PT Treatment/Exercise - 04/10/19 0001      Knee/Hip Exercises: Stretches   Hip Flexor Stretch  Left;2 reps;30 seconds    Hip Flexor Stretch Limitations  mod thomas stretch to tolerance  Knee/Hip Exercises: Aerobic   Recumbent Bike  lvl 1, 6 min -partial rev      Knee/Hip Exercises: Standing   Knee Flexion  Strengthening;Left;1 set;10 reps    Knee Flexion Limitations  3# HS curl on L LE at TM rail   c/o muscle fatigue   Wall Squat  10 reps;2 sets    Wall Squat Limitations  2nd set with red TB above knees; cues to increase L wt shift    Other Standing Knee Exercises  sidestepping with red TB around ankles 2x20"    Other Standing Knee Exercises  L knee flexion stretch at TM rail 10x5" to tolerance      Manual Therapy   Manual Therapy  Joint mobilization;Muscle Energy Technique;Soft tissue mobilization    Manual therapy comments  supine    Joint Mobilization  L patellar mobilizations grade III M/L and up/down- good mobility; L knee extension mobs grade III with 1/2 bolster under ankles; L knee flexion mobs with seatbelt grade III/IV to tolerance             PT Education - 04/10/19 1529     Education Details  advised to continue with HEP for max benefit    Person(s) Educated  Patient    Methods  Explanation;Demonstration;Tactile cues;Verbal cues    Comprehension  Verbalized understanding       PT Short Term Goals - 04/05/19 1826      PT SHORT TERM GOAL #1   Title  Patient to be independent with initial HEP.    Time  3    Period  Weeks    Status  Achieved    Target Date  03/14/19        PT Long Term Goals - 04/05/19 1826      PT LONG TERM GOAL #1   Title  Patient to be independent with advanced HEP.    Time  4    Period  Weeks    Status  On-going   met for current   Target Date  05/03/19      PT LONG TERM GOAL #2   Title  Patient to demonstrate L knee AROM/PROM 0-120 degrees.    Time  4    Period  Weeks    Status  On-going   AROM 2-96 degrees and PROM 0-102 degrees   Target Date  05/03/19      PT LONG TERM GOAL #3   Title  Patient to demonstrate reciprocal stair climbing up/down 13 steps with 1 handrail as needed with <3/10 pain and good stability.    Time  4    Period  Weeks    Status  On-going   still performing step-to when descending d/t pain   Target Date  05/03/19      PT LONG TERM GOAL #4   Title  Patient to demonstrate B LE strength >=4+/5.    Time  4    Period  Weeks    Status  Partially Met   improvements in L hip abduction/adduction, L knee extension, and R knee flexion strength- L knee flexion strength now most limiting   Target Date  05/03/19      PT LONG TERM GOAL #5   Title  Patient to demonstrate symmetrical weight shift, knee flexion, and step length with ambulation with LRAD.    Time  4    Period  Weeks    Status  Partially Met   demonstrating decreased step length on R LE with  ambulation without AD d/t fear of falling   Target Date  05/03/19            Plan - 04/10/19 1530    Clinical Impression Statement  Patient reporting increased stiffness in L knee this afternoon. Patient tolerated manual therapy for improvement  in joint mobility and knee flexion and extension ROM. Patient reporting that she took pain meds before session, thus better able to tolerate knee flexion mobs today.  Followed through with LE stretching for continued focus on knee ROM. Worked on standing HS strengthening with light ankle weight with patient demonstrating weakness but able to tolerate. Required cues to shift weight towards L with wall squats d/t avoidance of using this side. Advised patient to continue performing HEP at home for max benefit as patient admitting to limited compliance- patient reported understanding. No complaints at end of session.    Personal Factors and Comorbidities  Age;Comorbidity 3+;Time since onset of injury/illness/exacerbation;Past/Current Experience;Fitness;Profession    Comorbidities  hx colon CA, L UKA 2016, anemia, migraines, gastric bypass, DM, depression, anxiety, L knee arthroscopy, L ACL repair    Rehab Potential  Good    PT Frequency  2x / week    PT Duration  4 weeks    PT Treatment/Interventions  ADLs/Self Care Home Management;Electrical Stimulation;Cryotherapy;Moist Heat;Balance training;Therapeutic exercise;Therapeutic activities;Functional mobility training;Stair training;Gait training;Ultrasound;Neuromuscular re-education;Patient/family education;Manual techniques;Vasopneumatic Device;Taping;Energy conservation;Dry needling;Passive range of motion;Scar mobilization    PT Next Visit Plan  Functional knee flexion stretching; MT for improved flexion ROM; modalities prn    Consulted and Agree with Plan of Care  Patient       Patient will benefit from skilled therapeutic intervention in order to improve the following deficits and impairments:  Hypomobility, Increased edema, Decreased scar mobility, Decreased knowledge of precautions, Decreased activity tolerance, Decreased strength, Pain, Difficulty walking, Decreased balance, Decreased range of motion, Improper body mechanics, Postural dysfunction,  Impaired flexibility  Visit Diagnosis: Acute pain of left knee  Stiffness of left knee, not elsewhere classified  Muscle weakness (generalized)  Difficulty in walking, not elsewhere classified     Problem List Patient Active Problem List   Diagnosis Date Noted  . Status post total left knee replacement 01/29/2019  . Primary osteoarthritis of left knee 01/02/2019  . IDA (iron deficiency anemia) 03/18/2016  . 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 Chronic diarrhea 09/11/2015  . Family history of colon cancer 09/11/2015  . History of colon polyps 09/11/2015  . Status post left partial knee replacement 08/05/2014  . Other iron deficiency anemias 02/05/2013  . Gastric bypass status for obesity 02/05/2013     Janene Harvey, PT, DPT 04/10/19 3:32 PM   [facility] [address]  [place], [place], [address] Phone: [phone]   Fax:  [phone]  Name: Brandi Lutz MRN: [id number] Date of Birth: 16-Sep-1975

## 2019-04-11 NOTE — Telephone Encounter (Signed)
I called patient no answer LMOM with details below.

## 2019-04-12 ENCOUNTER — Other Ambulatory Visit: Payer: Self-pay | Admitting: Physician Assistant

## 2019-04-12 ENCOUNTER — Telehealth: Payer: Self-pay | Admitting: Orthopaedic Surgery

## 2019-04-12 ENCOUNTER — Other Ambulatory Visit: Payer: Self-pay

## 2019-04-12 ENCOUNTER — Other Ambulatory Visit: Payer: Self-pay | Admitting: Orthopaedic Surgery

## 2019-04-12 ENCOUNTER — Ambulatory Visit: Payer: Managed Care, Other (non HMO)

## 2019-04-12 DIAGNOSIS — M6281 Muscle weakness (generalized): Secondary | ICD-10-CM

## 2019-04-12 DIAGNOSIS — M25562 Pain in left knee: Secondary | ICD-10-CM | POA: Diagnosis not present

## 2019-04-12 DIAGNOSIS — M25662 Stiffness of left knee, not elsewhere classified: Secondary | ICD-10-CM

## 2019-04-12 DIAGNOSIS — R262 Difficulty in walking, not elsewhere classified: Secondary | ICD-10-CM

## 2019-04-12 NOTE — Telephone Encounter (Signed)
Looks like she may actually not need a refill.  Can you check?

## 2019-04-12 NOTE — Therapy (Signed)
[facility] [address]  [place], [place], [address] Phone: [phone]   Fax:  [phone]  Physical Therapy Treatment  Patient Details  Name: Brandi Lutz MRN: [id number] Date of Birth: [birth year] Referring Provider (PT): Frankey Shown, MD   Encounter Date: 04/12/2019  PT End of Session - 04/12/19 0855    Visit Number  17    Number of Visits  23    Date for PT Re-Evaluation  05/03/19    Authorization Type  [employer]ign[employer]    PT Start Time  0849    PT Stop Time  0928    PT Time Calculation (min)  39 min    Activity Tolerance  Patient tolerated treatment well;Patient limited by pain    Behavior During Therapy  [facility] for tasks assessed/performed       Past Medical History:  Diagnosis Date  . Anxiety   . Arthritis   . Cancer ([place])    colon  . Depression    takes Wellbutrin and Zoloft daily  . Diabetes mellitus without complication ([place])    diet and gastric surgery controlled  . Gastric bypass status for obesity 02/05/2013  . Headache   . History of colon polyps    benign  . History of migraine    none since high school  . Joint pain   . Joint swelling   . Other specified iron deficiency anemias 02/05/2013   2 iron infusions since gastric bypass;no abnormal reaction noted  . PONV (postoperative nausea and vomiting)   . 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 APPENDECTOMY    . ARTHROSCOPIC REPAIR ACL Left 20 yrs ago  . CHOLECYSTECTOMY    . COLECTOMY    . COLONOSCOPY    . GASTRIC BYPASS    . herniated bowel repair    . KNEE ARTHROSCOPY Left   . PARTIAL KNEE ARTHROPLASTY Left 08/05/2014   Procedure: LEFT UNICOMPARTMENTAL KNEE;  Surgeon: Marianna Payment, MD;  Location: [facility];  Service: Orthopedics;  Laterality: Left;  . TOTAL KNEE ARTHROPLASTY Left 01/29/2019  . TOTAL KNEE REVISION Left 01/29/2019   Procedure: CONVERSION OF LEFT  UNICOMPARTMENTAL KNEE ARTHROPLASTY TO LEFT TOTAL KNEE ARTHROPLASTY;  Surgeon: Leandrew Koyanagi, MD;  Location: [facility];  Service: Orthopedics;  Laterality: Left;    There were no vitals filed for this visit.  Subjective Assessment - 04/12/19 0854    Subjective  Pt. noting pain meds is helping to control the pain.    Pertinent History  colon CA, L UKA 2016, anemia, migraines, gastric bypass, DM, depression, anxiety, L knee arthroscopy, L ACL repair    Patient Stated Goals  be able to climb the stairs so that i can work from home    Currently in Pain?  Yes    Pain Score  2     Pain Location  Knee    Pain Orientation  Left    Pain Descriptors / Indicators  Aching    Pain Type  Acute pain    Pain Onset  More than a month ago    Pain Frequency  Intermittent    Multiple Pain Sites  No         [facility] PT Assessment - 04/12/19 0001      Assessment   Medical Diagnosis  s/p L TKA    Referring Provider (PT)  Frankey Shown, MD    Onset Date/Surgical  Date  01/29/19    Hand Dominance  Right    Next MD Visit  04/17/19    Prior Therapy  yes      AROM   Left Knee Extension  2    Left Knee Flexion  112   Pt. noting much improved pain levels 4/10 with pain meds      PROM   Left Knee Extension  0    Left Knee Flexion  115   Pt. noting knee pain up to 5/10 improved with pain meds      Strength   Right Hip Flexion  5/5    Right Hip ABduction  4+/5    Right Hip ADduction  4+/5    Left Hip Flexion  5/5    Left Hip ABduction  4+/5    Left Hip ADduction  4+/5    Right Knee Flexion  5/5    Right Knee Extension  5/5    Left Knee Flexion  3+/5   discomfort in GS/HS   Left Knee Extension  5/5    Right Ankle Dorsiflexion  4+/5    Right Ankle Plantar Flexion  4+/5    Left Ankle Dorsiflexion  4+/5    Left Ankle Plantar Flexion  4+/5                   [facility] Adult PT Treatment/Exercise - 04/12/19 0001      Ambulation/Gait   Stairs  Yes    Stairs Assistance  5: Supervision    Stair  Management Technique  One rail Right;Alternating pattern;Step to pattern    Number of Stairs  13    Height of Stairs  8    Gait Comments  Pt. able to ascend/descend stairs with one rail use reciprocally with 2/10 pain at most descending however prefering step-to pattern due to apprehension and with visible L quad weakness       Knee/Hip Exercises: Stretches   Passive Hamstring Stretch  Left;2 reps;30 seconds   due to complaint of HS tightness    Passive Hamstring Stretch Limitations  seated     Quad Stretch  Left;1 rep;60 seconds    Quad Stretch Limitations  prone with strap + bolster under thigh     Hip Flexor Stretch  Left;2 reps;30 seconds    Hip Flexor Stretch Limitations  mod thomas stretch to tolerance    Knee: Self-Stretch to increase Flexion  Left    Knee: Self-Stretch Limitations  5" x 10 reps standing at counter and stool     Gastroc Stretch  Left;2 reps;30 seconds   due to complaint of L calf tenderness/tightness   Gastroc Stretch Limitations  runners stretch into wall       Knee/Hip Exercises: Aerobic   Recumbent Bike  lvl 1, 6 min - full revolutions       Knee/Hip Exercises: Machines for Strengthening   Cybex Knee Flexion  B LE's 20# x 10 reps       Knee/Hip Exercises: Standing   Heel Raises  Both;15 reps    Heel Raises Limitations  at TM rail    Lateral Step Up  Left;10 reps;Step Height: 8";Hand Hold: 2   Improved eccentric control with increased step height today    Lateral Step Up Limitations  at TM rail       Manual Therapy   Manual Therapy  Joint mobilization;Muscle Energy Technique;Soft tissue mobilization    Manual therapy comments  supine and seated     Joint  Mobilization  L knee flexion mobs with seatbelt grade III/IV to tolerance    Muscle Energy Technique  L knee flexion contract/relax stretch x 2 rounds with therapist                PT Short Term Goals - 04/05/19 1826      PT SHORT TERM GOAL #1   Title  Patient to be independent with initial  HEP.    Time  3    Period  Weeks    Status  Achieved    Target Date  03/14/19        PT Long Term Goals - 04/12/19 0914      PT LONG TERM GOAL #1   Title  Patient to be independent with advanced HEP.    Time  4    Period  Weeks    Status  Partially Met   met for current     PT LONG TERM GOAL #2   Title  Patient to demonstrate L knee AROM/PROM 0-120 degrees.    Time  4    Period  Weeks    Status  Partially Met   AROM 2-112 degrees and PROM 0-115 degrees     PT LONG TERM GOAL #3   Title  Patient to demonstrate reciprocal stair climbing up/down 13 steps with 1 handrail as needed with <3/10 pain and good stability.    Time  4    Period  Weeks    Status  Partially Met   04/12/19: limited comfidence and control reciprocally descneindg however able to perform reciprocal navigation ascending/descnending with 1 rail use and 2/10 pain at most     PT LONG TERM GOAL #4   Title  Patient to demonstrate B LE strength >=4+/5.    Time  4    Period  Weeks    Status  Partially Met   04/12/19: remaining weakness in L HS     PT LONG TERM GOAL #5   Title  Patient to demonstrate symmetrical weight shift, knee flexion, and step length with ambulation with LRAD.    Time  4    Period  Weeks    Status  Partially Met   04/12/19: Improved heel strike and L weight shift however still with forward flexed posture           Plan - 04/12/19 0938    Clinical Impression Statement  Pt. has made good progress with physical therapy.  With improved tolerance for flexion-based therex and manual therapy focusing on improving flexion ROM as she notes she has been taking her "heavy" pain medications for 1 week.  Able to demo improved L knee ROM today AROM 2-112 dg, PROM 0-115 dg.  LTG #2 partially achieved.  Demonstrating progress toward LTG #3 partially achieved as pt. able to navigate stairs with 1 rail use reciprocally with 2/10 pain at most however some quad/hip weakness noted and pt. apprehensive  preferring descending with step-to pattern.  LTG #4 partially achieved with MMT of LE today primary weakness remaining in L HS and some tenderness in L HS/GS area thus addressed with LE stretching today.  Pt. demonstrating much improved gait pattern with improved hip/knee flexion mechanics and L weight shift which she attributes, again, to being consistent with prescribed pain medications.  Pt. has made excellent knee flexion ROM progress today with improved pain tolerance and progressing well with physical therapy.    Personal Factors and Comorbidities  Age;Comorbidity 3+;Time since onset of injury/illness/exacerbation;Past/Current Experience;Fitness;Profession  Comorbidities  hx colon CA, L UKA 2016, anemia, migraines, gastric bypass, DM, depression, anxiety, L knee arthroscopy, L ACL repair    Rehab Potential  Good    PT Treatment/Interventions  ADLs/Self Care Home Management;Electrical Stimulation;Cryotherapy;Moist Heat;Balance training;Therapeutic exercise;Therapeutic activities;Functional mobility training;Stair training;Gait training;Ultrasound;Neuromuscular re-education;Patient/family education;Manual techniques;Vasopneumatic Device;Taping;Energy conservation;Dry needling;Passive range of motion;Scar mobilization    PT Next Visit Plan  Functional knee flexion stretching; MT for improved flexion ROM; modalities prn    Consulted and Agree with Plan of Care  Patient       Patient will benefit from skilled therapeutic intervention in order to improve the following deficits and impairments:  Hypomobility, Increased edema, Decreased scar mobility, Decreased knowledge of precautions, Decreased activity tolerance, Decreased strength, Pain, Difficulty walking, Decreased balance, Decreased range of motion, Improper body mechanics, Postural dysfunction, Impaired flexibility  Visit Diagnosis: Acute pain of left knee  Stiffness of left knee, not elsewhere classified  Muscle weakness  (generalized)  Difficulty in walking, not elsewhere classified     Problem List Patient Active Problem List   Diagnosis Date Noted  . Status post total left knee replacement 01/29/2019  . Primary osteoarthritis of left knee 01/02/2019  . IDA (iron deficiency anemia) 03/18/2016  . 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 Chronic diarrhea 09/11/2015  . Family history of colon cancer 09/11/2015  . History of colon polyps 09/11/2015  . Status post left partial knee replacement 08/05/2014  . Other iron deficiency anemias 02/05/2013  . Gastric bypass status for obesity 02/05/2013    Bess Harvest, PTA 04/12/19 1:14 PM    [facility] [address]  [place], [place], [address] Phone: [phone]   Fax:  [phone]  Name: JEREMIE ABDELAZIZ MRN: [id number] Date of Birth: 05-31-1976

## 2019-04-12 NOTE — Telephone Encounter (Signed)
Ment to send it to [facility]

## 2019-04-12 NOTE — Telephone Encounter (Signed)
Patient called needing Rx refilled (Oxycodone) The number to contact patient is [phone]

## 2019-04-13 ENCOUNTER — Telehealth: Payer: Self-pay | Admitting: Orthopaedic Surgery

## 2019-04-13 ENCOUNTER — Other Ambulatory Visit: Payer: Self-pay | Admitting: Orthopaedic Surgery

## 2019-04-13 ENCOUNTER — Other Ambulatory Visit: Payer: Self-pay | Admitting: Physician Assistant

## 2019-04-13 MED ORDER — HYDROCODONE-ACETAMINOPHEN 5-325 MG PO TABS: 1.0000 | ORAL_TABLET | Freq: Two times a day (BID) | ORAL | 0 refills | Status: DC | PRN

## 2019-04-13 MED ORDER — OXYCODONE HCL 5 MG PO TABS: 5.0000 mg | ORAL_TABLET | Freq: Every day | ORAL | 0 refills | Status: DC | PRN

## 2019-04-13 NOTE — Telephone Encounter (Signed)
Duplicate message in chart sent to [place].

## 2019-04-13 NOTE — Telephone Encounter (Signed)
Pt called in checking on her prescription Oxycodone, please have that sent to [facility] in [place] [address]   [phone]

## 2019-04-13 NOTE — Telephone Encounter (Signed)
Message sent in error

## 2019-04-13 NOTE — Telephone Encounter (Signed)
We need to wean to norco at this point and I just sent in to [facility].  Needs to take sparingly

## 2019-04-13 NOTE — Telephone Encounter (Signed)
Patient called in reference to her Oxycodone.  I see in her chart that the zofran was sent in and she picked that up from the pharmacy, but has not heard anything about the other medication.  CB#[phone].  Thank you.

## 2019-04-13 NOTE — Telephone Encounter (Signed)
Duplicate message in chart sent to [place] [facility].

## 2019-04-13 NOTE — Telephone Encounter (Signed)
Can you please advise? Patient has left multiple messages today in regards to pain medication refill. Thanks.

## 2019-04-13 NOTE — Telephone Encounter (Signed)
I called patient and advised. 

## 2019-04-17 ENCOUNTER — Ambulatory Visit (INDEPENDENT_AMBULATORY_CARE_PROVIDER_SITE_OTHER): Payer: Managed Care, Other (non HMO)

## 2019-04-17 ENCOUNTER — Ambulatory Visit: Payer: Managed Care, Other (non HMO) | Attending: Physician Assistant

## 2019-04-17 ENCOUNTER — Ambulatory Visit (INDEPENDENT_AMBULATORY_CARE_PROVIDER_SITE_OTHER): Payer: Managed Care, Other (non HMO) | Admitting: Physician Assistant

## 2019-04-17 ENCOUNTER — Encounter: Payer: Self-pay | Admitting: Orthopaedic Surgery

## 2019-04-17 ENCOUNTER — Other Ambulatory Visit: Payer: Self-pay

## 2019-04-17 DIAGNOSIS — M25662 Stiffness of left knee, not elsewhere classified: Secondary | ICD-10-CM | POA: Diagnosis present

## 2019-04-17 DIAGNOSIS — M25562 Pain in left knee: Secondary | ICD-10-CM

## 2019-04-17 DIAGNOSIS — Z96652 Presence of left artificial knee joint: Secondary | ICD-10-CM

## 2019-04-17 DIAGNOSIS — R262 Difficulty in walking, not elsewhere classified: Secondary | ICD-10-CM | POA: Diagnosis present

## 2019-04-17 DIAGNOSIS — M6281 Muscle weakness (generalized): Secondary | ICD-10-CM

## 2019-04-17 NOTE — Therapy (Signed)
[facility] [address]  [place], [place], [address] Phone: [phone]   Fax:  [phone]  Physical Therapy Treatment  Patient Details  Name: Brandi Lutz MRN: [id number] Date of Birth: February 24, 1976 Referring Provider (PT): Frankey Shown, MD   Encounter Date: 04/17/2019  PT End of Session - 04/17/19 1531    Visit Number  18    Number of Visits  23    Date for PT Re-Evaluation  05/03/19    Authorization Type  Cigna    PT Start Time  1528    PT Stop Time  1608    PT Time Calculation (min)  40 min    Activity Tolerance  Patient tolerated treatment well;Patient limited by pain    Behavior During Therapy  [facility] for tasks assessed/performed       Past Medical History:  Diagnosis Date  . Anxiety   . Arthritis   . Cancer ([place])    colon  . Depression    takes Wellbutrin and Zoloft daily  . Diabetes mellitus without complication ([place])    diet and gastric surgery controlled  . Gastric bypass status for obesity 02/05/2013  . Headache   . History of colon polyps    benign  . History of migraine    none since high school  . Joint pain   . Joint swelling   . Other specified iron deficiency anemias 02/05/2013   2 iron infusions since gastric bypass;no abnormal reaction noted  . PONV (postoperative nausea and vomiting)   . 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 APPENDECTOMY    . ARTHROSCOPIC REPAIR ACL Left 20 yrs ago  . CHOLECYSTECTOMY    . COLECTOMY    . COLONOSCOPY    . GASTRIC BYPASS    . herniated bowel repair    . KNEE ARTHROSCOPY Left   . PARTIAL KNEE ARTHROPLASTY Left 08/05/2014   Procedure: LEFT UNICOMPARTMENTAL KNEE;  Surgeon: Marianna Payment, MD;  Location: [facility];  Service: Orthopedics;  Laterality: Left;  . TOTAL KNEE ARTHROPLASTY Left 01/29/2019  . TOTAL KNEE REVISION Left 01/29/2019   Procedure: CONVERSION OF LEFT  UNICOMPARTMENTAL KNEE ARTHROPLASTY TO LEFT TOTAL KNEE ARTHROPLASTY;  Surgeon: Leandrew Koyanagi, MD;  Location: [facility];  Service: Orthopedics;  Laterality: Left;    There were no vitals filed for this visit.  Subjective Assessment - 04/17/19 1536    Subjective  Pt. reporting MD called off manipulation for ROM as he was pleased with ROM improvement at recent f/u    Pertinent History  colon CA, L UKA 2016, anemia, migraines, gastric bypass, DM, depression, anxiety, L knee arthroscopy, L ACL repair    Patient Stated Goals  be able to climb the stairs so that i can work from home    Currently in Pain?  No/denies    Pain Score  0-No pain    Multiple Pain Sites  No                       [facility] Adult PT Treatment/Exercise - 04/17/19 0001      Knee/Hip Exercises: Stretches   Quad Stretch  Left;1 rep;60 seconds    Quad Stretch Limitations  prone with strap + bolster under thigh     Hip Flexor Stretch  Left;1 rep;60 seconds    Hip Flexor Stretch Limitations  mod thomas stretch to tolerance  Knee/Hip Exercises: Aerobic   Recumbent Bike  lvl 1, 6 min - full revolutions       Knee/Hip Exercises: Machines for Strengthening   Cybex Knee Flexion  B LE's 20# x 15 reps; B LE's x 15 reps; 15# focusing on control in 90/110 dg flexion ROM    Cybex Leg Press  B LE's: 30# x 15 resp       Knee/Hip Exercises: Standing   Heel Raises  Both;Left;10 reps    Heel Raises Limitations  B con/L ecc    Lateral Step Up  Left;10 reps;Step Height: 8";Hand Hold: 2    Lateral Step Up Limitations  at TM    Forward Step Up  Left;10 reps;Step Height: 8";Hand Hold: 1    Forward Step Up Limitations  at TM     Step Down  Left;10 reps;Hand Hold: 1;Step Height: 4"   Cues for slow eccentric lowering    Step Down Limitations  at TM rail     Functional Squat  15 reps;3 seconds    Functional Squat Limitations  TRX to wooden box     Other Standing Knee Exercises  B heel raise  x 15 reps                 PT Short Term Goals - 04/05/19 1826      PT SHORT TERM GOAL #1   Title  Patient to be independent with initial HEP.    Time  3    Period  Weeks    Status  Achieved    Target Date  03/14/19        PT Long Term Goals - 04/12/19 0914      PT LONG TERM GOAL #1   Title  Patient to be independent with advanced HEP.    Time  4    Period  Weeks    Status  Partially Met   met for current     PT LONG TERM GOAL #2   Title  Patient to demonstrate L knee AROM/PROM 0-120 degrees.    Time  4    Period  Weeks    Status  Partially Met   AROM 2-112 degrees and PROM 0-115 degrees     PT LONG TERM GOAL #3   Title  Patient to demonstrate reciprocal stair climbing up/down 13 steps with 1 handrail as needed with <3/10 pain and good stability.    Time  4    Period  Weeks    Status  Partially Met   04/12/19: limited comfidence and control reciprocally descneindg however able to perform reciprocal navigation ascending/descnending with 1 rail use and 2/10 pain at most     PT LONG TERM GOAL #4   Title  Patient to demonstrate B LE strength >=4+/5.    Time  4    Period  Weeks    Status  Partially Met   04/12/19: remaining weakness in L HS     PT LONG TERM GOAL #5   Title  Patient to demonstrate symmetrical weight shift, knee flexion, and step length with ambulation with LRAD.    Time  4    Period  Weeks    Status  Partially Met   04/12/19: Improved heel strike and L weight shift however still with forward flexed posture           Plan - 04/17/19 1531    Clinical Impression Statement  [place] reporting MD pleased with recent ROM progress and wishes to  pt. to continue "pushing" with therapy for improved ROM and strength.  Progressed functional flexion activities with increased depth on TRX cable squat to ~ 13" box, progressed to 8" lateral, forward step-ups requiring less UE support, and initiated 4" eccentric step-down with notable L quad weakness seen.  Pt. ended  session with 1/10 knee pain and applied ice/compression machine to L knee in anticipation of controlling post-exercise swelling and pain.  Pt. verbalized understanding of continued ROM and strengthening focus verbalizing she plans to continue daily HEP adherence.    Personal Factors and Comorbidities  Age;Comorbidity 3+;Time since onset of injury/illness/exacerbation;Past/Current Experience;Fitness;Profession    Examination-Activity Limitations  Bathing;Sit;Bed Mobility;Sleep;Bend;Squat;Caring for Others;Stairs;Carry;Stand;Toileting;Dressing;Transfers;Lift;Locomotion Level    Rehab Potential  Good    PT Treatment/Interventions  ADLs/Self Care Home Management;Electrical Stimulation;Cryotherapy;Moist Heat;Balance training;Therapeutic exercise;Therapeutic activities;Functional mobility training;Stair training;Gait training;Ultrasound;Neuromuscular re-education;Patient/family education;Manual techniques;Vasopneumatic Device;Taping;Energy conservation;Dry needling;Passive range of motion;Scar mobilization    PT Next Visit Plan  Functional knee flexion stretching; MT for improved flexion ROM; modalities prn    Consulted and Agree with Plan of Care  Patient       Patient will benefit from skilled therapeutic intervention in order to improve the following deficits and impairments:  Hypomobility, Increased edema, Decreased scar mobility, Decreased knowledge of precautions, Decreased activity tolerance, Decreased strength, Pain, Difficulty walking, Decreased balance, Decreased range of motion, Improper body mechanics, Postural dysfunction, Impaired flexibility  Visit Diagnosis: Acute pain of left knee  Stiffness of left knee, not elsewhere classified  Muscle weakness (generalized)  Difficulty in walking, not elsewhere classified     Problem List Patient Active Problem List   Diagnosis Date Noted  . Status post total left knee replacement 01/29/2019  . Primary osteoarthritis of left knee 01/02/2019   . IDA (iron deficiency anemia) 03/18/2016  . 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 Chronic diarrhea 09/11/2015  . Family history of colon cancer 09/11/2015  . History of colon polyps 09/11/2015  . Status post left partial knee replacement 08/05/2014  . Other iron deficiency anemias 02/05/2013  . Gastric bypass status for obesity 02/05/2013    Bess Harvest, PTA 04/17/19 5:09 PM    [facility] [address]  [place], [place], [address] Phone: [phone]   Fax:  [phone]  Name: KANDICE SCHMELTER MRN: [id number] Date of Birth: 1975/12/28

## 2019-04-17 NOTE — Progress Notes (Signed)
Post-Op Visit Note   Patient: Brandi Lutz           Date of Birth: 10-06-1975           MRN: [id number] Visit Date: 04/17/2019 PCP: Benito Mccreedy, MD   Assessment & Plan:  Chief Complaint:  Chief Complaint  Patient presents with  . Left Knee - Pain   Visit Diagnoses:  1. Status post left knee replacement     Plan: Patient is a pleasant 43 year old female who comes in today 80 days status post conversion left partial knee replacement to a total knee replacement, date of surgery 01/29/2019.  She has been doing well.  She has really been pushing things in outpatient physical therapy where she has regained a fair amount of flexion.  Examination of her left knee shows a fully healed surgical scar.  No complication.  Range of motion 0 to 110 degrees.  [name] stable valgus varus stress.  [name] neurovascular intact distally.  At this point, she will continue to push things with physical therapy.  We will refill her narcotics as needed to facilitate this.  She will follow-up with [place] in 3-1/2 months time when she is 6 months out from surgery.  If she notices that she is not continuing to progress or that she regresses, she will follow-up with [place] sooner.  Dental prophylaxis reinforced.  Call with concerns or questions in the meantime.  Follow-Up Instructions: Return in about 3 months (around 07/18/2019).   Orders:  Orders Placed This Encounter  Procedures  . XR Knee 1-2 Views Left   No orders of the defined types were placed in this encounter.   Imaging: No new imaging  PMFS History: Patient Active Problem List   Diagnosis Date Noted  . Status post total left knee replacement 01/29/2019  . Primary osteoarthritis of left knee 01/02/2019  . IDA (iron deficiency anemia) 03/18/2016  . 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 Chronic diarrhea 09/11/2015  . Family history of colon cancer 09/11/2015  . History of colon polyps 09/11/2015  . Status post left partial knee replacement  08/05/2014  . Other iron deficiency anemias 02/05/2013  . Gastric bypass status for obesity 02/05/2013   Past Medical History:  Diagnosis Date  . Anxiety   . Arthritis   . Cancer ([place])    colon  . Depression    takes Wellbutrin and Zoloft daily  . Diabetes mellitus without complication ([place])    diet and gastric surgery controlled  . Gastric bypass status for obesity 02/05/2013  . Headache   . History of colon polyps    benign  . History of migraine    none since high school  . Joint pain   . Joint swelling   . Other specified iron deficiency anemias 02/05/2013   2 iron infusions since gastric bypass;no abnormal reaction noted  . PONV (postoperative nausea and vomiting)   . S/P left unicompartmental knee replacement 08/05/2014    History reviewed. No pertinent family history.  Past Surgical History:  Procedure Laterality Date  . APPENDECTOMY    . ARTHROSCOPIC REPAIR ACL Left 20 yrs ago  . CHOLECYSTECTOMY    . COLECTOMY    . COLONOSCOPY    . GASTRIC BYPASS    . herniated bowel repair    . KNEE ARTHROSCOPY Left   . PARTIAL KNEE ARTHROPLASTY Left 08/05/2014   Procedure: LEFT UNICOMPARTMENTAL KNEE;  Surgeon: Marianna Payment, MD;  Location: [facility];  Service: Orthopedics;  Laterality: Left;  . TOTAL KNEE ARTHROPLASTY Left 01/29/2019  . TOTAL KNEE REVISION Left 01/29/2019   Procedure: CONVERSION OF LEFT UNICOMPARTMENTAL KNEE ARTHROPLASTY TO LEFT TOTAL KNEE ARTHROPLASTY;  Surgeon: Leandrew Koyanagi, MD;  Location: [facility];  Service: Orthopedics;  Laterality: Left;   Social History   Occupational History  . Not on file  Tobacco Use  . Smoking status: Never Smoker  . Smokeless tobacco: Never Used  Substance and Sexual Activity  . Alcohol use: Yes    Alcohol/week: 0.0 standard drinks    Comment: rarely  . Drug use: No  . Sexual activity: Not Currently

## 2019-04-19 ENCOUNTER — Ambulatory Visit: Payer: Managed Care, Other (non HMO)

## 2019-04-19 ENCOUNTER — Other Ambulatory Visit: Payer: Self-pay

## 2019-04-19 DIAGNOSIS — M25562 Pain in left knee: Secondary | ICD-10-CM | POA: Diagnosis not present

## 2019-04-19 DIAGNOSIS — R262 Difficulty in walking, not elsewhere classified: Secondary | ICD-10-CM

## 2019-04-19 DIAGNOSIS — M25662 Stiffness of left knee, not elsewhere classified: Secondary | ICD-10-CM

## 2019-04-19 DIAGNOSIS — M6281 Muscle weakness (generalized): Secondary | ICD-10-CM

## 2019-04-19 NOTE — Therapy (Signed)
[facility] [address]  [place], [place], [address] Phone: [phone]   Fax:  [phone]  Physical Therapy Treatment  Patient Details  Name: Brandi Lutz MRN: [id number] Date of Birth: Jul 26, 1975 Referring Provider (PT): Frankey Shown, MD   Encounter Date: 04/19/2019  PT End of Session - 04/19/19 1407    Visit Number  19    Number of Visits  23    Date for PT Re-Evaluation  05/03/19    Authorization Type  [employer]    PT Start Time  1401    PT Stop Time  1443    PT Time Calculation (min)  42 min    Activity Tolerance  Patient tolerated treatment well;Patient limited by pain    Behavior During Therapy  [facility] for tasks assessed/performed       Past Medical History:  Diagnosis Date  . Anxiety   . Arthritis   . Cancer ([place])    colon  . Depression    takes Wellbutrin and Zoloft daily  . Diabetes mellitus without complication ([place])    diet and gastric surgery controlled  . Gastric bypass status for obesity 02/05/2013  . Headache   . History of colon polyps    benign  . History of migraine    none since high school  . Joint pain   . Joint swelling   . Other specified iron deficiency anemias 02/05/2013   2 iron infusions since gastric bypass;no abnormal reaction noted  . PONV (postoperative nausea and vomiting)   . 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 APPENDECTOMY    . ARTHROSCOPIC REPAIR ACL Left 20 yrs ago  . CHOLECYSTECTOMY    . COLECTOMY    . COLONOSCOPY    . GASTRIC BYPASS    . herniated bowel repair    . KNEE ARTHROSCOPY Left   . PARTIAL KNEE ARTHROPLASTY Left 08/05/2014   Procedure: LEFT UNICOMPARTMENTAL KNEE;  Surgeon: Marianna Payment, MD;  Location: [place];  Service: Orthopedics;  Laterality: Left;  . TOTAL KNEE ARTHROPLASTY Left 01/29/2019  . TOTAL KNEE REVISION Left 01/29/2019   Procedure: CONVERSION OF LEFT  UNICOMPARTMENTAL KNEE ARTHROPLASTY TO LEFT TOTAL KNEE ARTHROPLASTY;  Surgeon: Leandrew Koyanagi, MD;  Location: [place];  Service: Orthopedics;  Laterality: Left;    There were no vitals filed for this visit.  Subjective Assessment - 04/19/19 1406    Subjective  Pt. reporting no new issues.    Pertinent History  colon CA, L UKA 2016, anemia, migraines, gastric bypass, DM, depression, anxiety, L knee arthroscopy, L ACL repair    Patient Stated Goals  be able to climb the stairs so that i can work from home    Currently in Pain?  No/denies    Pain Score  0-No pain    Pain Location  Knee    Pain Orientation  Left    Pain Descriptors / Indicators  Aching    Pain Type  Acute pain    Pain Onset  More than a month ago    Pain Frequency  Intermittent    Multiple Pain Sites  No         [facility] PT Assessment - 04/19/19 0001      Assessment   Medical Diagnosis  s/p L TKA    Referring Provider (PT)  Frankey Shown, MD    Onset Date/Surgical Date  01/29/19  Hand Dominance  Right    Next MD Visit  ~ early february       AROM   Right/Left Knee  Left    Left Knee Extension  1    Left Knee Flexion  116      PROM   Left Knee Extension  0    Left Knee Flexion  121                   [facility] Adult PT Treatment/Exercise - 04/19/19 0001      Knee/Hip Exercises: Stretches   Passive Hamstring Stretch  Left;1 rep;30 seconds    Passive Hamstring Stretch Limitations  supine with strap    Hip Flexor Stretch  Left;1 rep;60 seconds    ITB Stretch  Left;1 rep;30 seconds    ITB Stretch Limitations  supine strap       Knee/Hip Exercises: Aerobic   Recumbent Bike  lvl 1, 6 min - full revolutions       Knee/Hip Exercises: Standing   Hip Flexion  Right;Left;10 reps;Knee straight;Stengthening    Hip Flexion Limitations  yellow TB at ankle; 2 ski poles     Hip ADduction  Right;Left;10 reps;Strengthening    Hip ADduction Limitations  yellow TB at ankle; 2 ski poles     Hip Abduction  Right;Left;10  reps;Knee straight    Abduction Limitations  yellow TB at ankle; 2 ski poles     Hip Extension  Right;Left;10 reps;Knee straight;Stengthening    Extension Limitations  yellow TB at ankle; 2 ski poles     Wall Squat  10 reps;3 seconds    Wall Squat Limitations  at wall     SLS  L SLS 2 x 15 sec     Other Standing Knee Exercises  Alternating cone nock over/righting 2 x 7 cones              PT Education - 04/19/19 1450    Education Details  HEP update; 4-way hip kicker with yellow TB issued to pt.    Person(s) Educated  Patient    Methods  Explanation;Demonstration;Verbal cues;Handout    Comprehension  Verbalized understanding;Returned demonstration;Verbal cues required       PT Short Term Goals - 04/05/19 1826      PT SHORT TERM GOAL #1   Title  Patient to be independent with initial HEP.    Time  3    Period  Weeks    Status  Achieved    Target Date  03/14/19        PT Long Term Goals - 04/19/19 1419      PT LONG TERM GOAL #1   Title  Patient to be independent with advanced HEP.    Time  4    Period  Weeks    Status  Partially Met   met for current     PT LONG TERM GOAL #2   Title  Patient to demonstrate L knee AROM/PROM 0-120 degrees.    Time  4    Period  Weeks    Status  Partially Met   AROM 1-116 degrees and PROM 0-121 degrees; met for passive     PT LONG TERM GOAL #3   Title  Patient to demonstrate reciprocal stair climbing up/down 13 steps with 1 handrail as needed with <3/10 pain and good stability.    Time  4    Period  Weeks    Status  Partially Met   04/12/19: limited  comfidence and control reciprocally descneindg however able to perform reciprocal navigation ascending/descnending with 1 rail use and 2/10 pain at most     PT LONG TERM GOAL #4   Title  Patient to demonstrate B LE strength >=4+/5.    Time  4    Period  Weeks    Status  Partially Met   04/12/19: remaining weakness in L HS     PT LONG TERM GOAL #5   Title  Patient to  demonstrate symmetrical weight shift, knee flexion, and step length with ambulation with LRAD.    Time  4    Period  Weeks    Status  Partially Met   04/12/19: Improved heel strike and L weight shift however still with forward flexed posture           Plan - 04/19/19 1408    Clinical Impression Statement  Brandi Lutz reporting she feels better and was able to demo L knee AROM progress to 1-116 dg, PROM 0-121 dg.  Progressing toward LTG #2 partially achieved this goal.  Able to initiate yellow TB resisted 4-way hip kicker without issue today.  Pt. did demonstrate proximal hip weakness/instability with L SLS activities today however progressing well with TKA protocol.  Ended visit with pt. denying need for modalities thus deferred.  HEP updated.    Personal Factors and Comorbidities  Age;Comorbidity 3+;Time since onset of injury/illness/exacerbation;Past/Current Experience;Fitness;Profession    Rehab Potential  Good    PT Treatment/Interventions  ADLs/Self Care Home Management;Electrical Stimulation;Cryotherapy;Moist Heat;Balance training;Therapeutic exercise;Therapeutic activities;Functional mobility training;Stair training;Gait training;Ultrasound;Neuromuscular re-education;Patient/family education;Manual techniques;Vasopneumatic Device;Taping;Energy conservation;Dry needling;Passive range of motion;Scar mobilization    PT Next Visit Plan  Functional knee flexion stretching; MT for improved flexion ROM; modalities prn    Consulted and Agree with Plan of Care  Patient       Patient will benefit from skilled therapeutic intervention in order to improve the following deficits and impairments:  Hypomobility, Increased edema, Decreased scar mobility, Decreased knowledge of precautions, Decreased activity tolerance, Decreased strength, Pain, Difficulty walking, Decreased balance, Decreased range of motion, Improper body mechanics, Postural dysfunction, Impaired flexibility  Visit Diagnosis: Acute  pain of left knee  Stiffness of left knee, not elsewhere classified  Muscle weakness (generalized)  Difficulty in walking, not elsewhere classified     Problem List Patient Active Problem List   Diagnosis Date Noted  . Status post total left knee replacement 01/29/2019  . Primary osteoarthritis of left knee 01/02/2019  . IDA (iron deficiency anemia) 03/18/2016  . 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 Chronic diarrhea 09/11/2015  . Family history of colon cancer 09/11/2015  . History of colon polyps 09/11/2015  . Status post left partial knee replacement 08/05/2014  . Other iron deficiency anemias 02/05/2013  . Gastric bypass status for obesity 02/05/2013    Bess Harvest, PTA 04/19/19 2:52 PM    [facility] [address]  [place], [place], [address] Phone: [phone]   Fax:  [phone]  Name: Brandi Lutz MRN: [id number] Date of Birth: 10/13/1975

## 2019-04-24 ENCOUNTER — Ambulatory Visit: Payer: Managed Care, Other (non HMO)

## 2019-04-24 ENCOUNTER — Other Ambulatory Visit: Payer: Self-pay

## 2019-04-24 DIAGNOSIS — R262 Difficulty in walking, not elsewhere classified: Secondary | ICD-10-CM

## 2019-04-24 DIAGNOSIS — M25562 Pain in left knee: Secondary | ICD-10-CM | POA: Diagnosis not present

## 2019-04-24 DIAGNOSIS — M6281 Muscle weakness (generalized): Secondary | ICD-10-CM

## 2019-04-24 DIAGNOSIS — M25662 Stiffness of left knee, not elsewhere classified: Secondary | ICD-10-CM

## 2019-04-24 NOTE — Therapy (Addendum)
[facility] [address]  [place], [place], [address] Phone: [phone]   Fax:  [phone]  Physical Therapy Treatment  Patient Details  Name: Brandi Lutz MRN: [id number] Date of Birth: [birth year] Referring Provider (PT): Frankey Shown, MD   Encounter Date: 04/24/2019  PT End of Session - 04/24/19 1407    Visit Number  20    Number of Visits  23    Date for PT Re-Evaluation  05/03/19    Authorization Type  [employer]    PT Start Time  1400    PT Stop Time  1441    PT Time Calculation (min)  41 min    Activity Tolerance  Patient tolerated treatment well;Patient limited by pain    Behavior During Therapy  [facility] for tasks assessed/performed       Past Medical History:  Diagnosis Date  . Anxiety   . Arthritis   . Cancer ([place])    colon  . Depression    takes Wellbutrin and Zoloft daily  . Diabetes mellitus without complication ([place])    diet and gastric surgery controlled  . Gastric bypass status for obesity 02/05/2013  . Headache   . History of colon polyps    benign  . History of migraine    none since high school  . Joint pain   . Joint swelling   . Other specified iron deficiency anemias 02/05/2013   2 iron infusions since gastric bypass;no abnormal reaction noted  . PONV (postoperative nausea and vomiting)   . 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 APPENDECTOMY    . ARTHROSCOPIC REPAIR ACL Left 20 yrs ago  . CHOLECYSTECTOMY    . COLECTOMY    . COLONOSCOPY    . GASTRIC BYPASS    . herniated bowel repair    . KNEE ARTHROSCOPY Left   . PARTIAL KNEE ARTHROPLASTY Left 08/05/2014   Procedure: LEFT UNICOMPARTMENTAL KNEE;  Surgeon: Marianna Payment, MD;  Location: [place];  Service: Orthopedics;  Laterality: Left;  . TOTAL KNEE ARTHROPLASTY Left 01/29/2019  . TOTAL KNEE REVISION Left 01/29/2019   Procedure: CONVERSION OF LEFT  UNICOMPARTMENTAL KNEE ARTHROPLASTY TO LEFT TOTAL KNEE ARTHROPLASTY;  Surgeon: Leandrew Koyanagi, MD;  Location: [facility] [place];  Service: Orthopedics;  Laterality: Left;    There were no vitals filed for this visit.  Subjective Assessment - 04/24/19 1404    Subjective  Pt. reporting she is still trying to be consistent with her pain medications.    Pertinent History  colon CA, L UKA 2016, anemia, migraines, gastric bypass, DM, depression, anxiety, L knee arthroscopy, L ACL repair    How long can you stand comfortably?  30 min as long as she is leaning on counter    How long can you walk comfortably?  variable, but up to an hour    Patient Stated Goals  be able to climb the stairs so that i can work from home    Currently in Pain?  No/denies    Pain Score  0-No pain    Multiple Pain Sites  No         [facility] PT Assessment - 04/24/19 0001      Assessment   Medical Diagnosis  s/p L TKA    Referring Provider (PT)  Frankey Shown, MD    Onset Date/Surgical Date  01/29/19    Hand Dominance  Right  Next MD Visit  ~ early february     Prior Therapy  yes      Observation/Other Assessments   Focus on Therapeutic Outcomes (FOTO)   Knee: 59% (41% limitation)      AROM   AROM Assessment Site  Knee    Right/Left Knee  Left    Left Knee Flexion  118      PROM   PROM Assessment Site  Knee    Right/Left Knee  Left    Left Knee Extension  0    Left Knee Flexion  122      Strength   Strength Assessment Site  Hip;Knee;Ankle    Right/Left Hip  Right;Left    Right Hip Flexion  5/5    Right Hip ABduction  4+/5    Right Hip ADduction  4+/5    Left Hip Flexion  5/5    Left Hip ABduction  4+/5    Left Hip ADduction  4+/5    Right/Left Knee  Right;Left    Right Knee Flexion  5/5    Right Knee Extension  5/5    Left Knee Flexion  4-/5   L distal HS and proximal GS   Left Knee Extension  5/5    Right/Left Ankle  Right;Left    Right Ankle Dorsiflexion  5/5    Right Ankle Plantar Flexion  4+/5    Left  Ankle Dorsiflexion  5/5    Left Ankle Plantar Flexion  4+/5                   [facility] Adult PT Treatment/Exercise - 04/24/19 0001      Ambulation/Gait   Stairs  Yes    Stairs Assistance  5: Supervision    Stairs Assistance Details (indicate cue type and reason)  Pt. navigating stairs reciproally ascending with 1 rail use and good quad control however mild B hip drop likely due to ongoing hip weakness somewhat improved with cueing for "level" hips    Stair Management Technique  One rail Right;Alternating pattern    Number of Stairs  13      Knee/Hip Exercises: Stretches   Sports administrator  Left;1 rep;60 seconds    Quad Stretch Limitations  prone with strap + bolster under thigh     Hip Flexor Stretch  Left;1 rep;60 seconds    Hip Flexor Stretch Limitations  mod thomas stretch to tolerance      Knee/Hip Exercises: Aerobic   Recumbent Bike  lvl 1, 6 min - full revolutions       Knee/Hip Exercises: Standing   Lateral Step Up  Right;Left;10 reps;Step Height: 6";Hand Hold: 2    Lateral Step Up Limitations  at machine + hip hike and lateral step-down                PT Short Term Goals - 04/05/19 1826      PT SHORT TERM GOAL #1   Title  Patient to be independent with initial HEP.    Time  3    Period  Weeks    Status  Achieved    Target Date  03/14/19        PT Long Term Goals - 04/24/19 1412      PT LONG TERM GOAL #1   Title  Patient to be independent with advanced HEP.    Time  4    Period  Weeks    Status  Partially Met   met for current  PT LONG TERM GOAL #2   Title  Patient to demonstrate L knee AROM/PROM 0-120 degrees.    Time  4    Period  Weeks    Status  Partially Met   AROM 0-118 degrees and PROM 0-122 degrees; met for passive     PT LONG TERM GOAL #3   Title  Patient to demonstrate reciprocal stair climbing up/down 13 steps with 1 handrail as needed with <3/10 pain and good stability.    Time  4    Period  Weeks    Status  Partially Met    04/24/19:  achieved aside from lack of stability at lateral hip with slight hip drop     PT LONG TERM GOAL #4   Title  Patient to demonstrate B LE strength >=4+/5.    Time  4    Period  Weeks    Status  Partially Met   04/24/19: remaining weakness in L HS however improved from when last tested     PT LONG TERM GOAL #5   Title  Patient to demonstrate symmetrical weight shift, knee flexion, and step length with ambulation with LRAD.    Time  4    Period  Weeks    Status  Partially Met   04/24/19;  still with somewhat limited L knee hip/knee flexion at times           Plan - 04/24/19 1448    Clinical Impression Statement  Pt. demonstrating improved L knee AROM 0-118 dg, PROM 0-122 dg today nearly achieving LTG #2.  Partially achieved stairs goal (LTG #3) able to ascend/descend stairs reciprocally x 13 steps with good quad control however slight B hip drop indicating some functional lateral hip weakness.  Session focused on lateral hip strengthening which was tolerated well.  Some remaining L HS weakness as well which pt. encouraged to perform HEP for focus on ongoing HS strengthening and verbalized understanding.  LTG #4 partially met with MMT today.  Instructed pt. in increased L hip/knee flexion with gait as she still has circumduction slightly with ambulation without AD.  Pt. progressing well with therapy and on track to achieve all remaining LTGs.    Comorbidities  hx colon CA, L UKA 2016, anemia, migraines, gastric bypass, DM, depression, anxiety, L knee arthroscopy, L ACL repair    Rehab Potential  Good    PT Treatment/Interventions  ADLs/Self Care Home Management;Electrical Stimulation;Cryotherapy;Moist Heat;Balance training;Therapeutic exercise;Therapeutic activities;Functional mobility training;Stair training;Gait training;Ultrasound;Neuromuscular re-education;Patient/family education;Manual techniques;Vasopneumatic Device;Taping;Energy conservation;Dry needling;Passive range of  motion;Scar mobilization    PT Next Visit Plan  Functional knee flexion stretching; MT for improved flexion ROM; modalities prn    Consulted and Agree with Plan of Care  Patient       Patient will benefit from skilled therapeutic intervention in order to improve the following deficits and impairments:  Hypomobility, Increased edema, Decreased scar mobility, Decreased knowledge of precautions, Decreased activity tolerance, Decreased strength, Pain, Difficulty walking, Decreased balance, Decreased range of motion, Improper body mechanics, Postural dysfunction, Impaired flexibility  Visit Diagnosis: Acute pain of left knee  Stiffness of left knee, not elsewhere classified  Muscle weakness (generalized)  Difficulty in walking, not elsewhere classified     Problem List Patient Active Problem List   Diagnosis Date Noted  . Status post total left knee replacement 01/29/2019  . Primary osteoarthritis of left knee 01/02/2019  . IDA (iron deficiency anemia) 03/18/2016  . 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 Chronic diarrhea 09/11/2015  .  Family history of colon cancer 09/11/2015  . History of colon polyps 09/11/2015  . Status post left partial knee replacement 08/05/2014  . Other iron deficiency anemias 02/05/2013  . Gastric bypass status for obesity 02/05/2013    Bess Harvest, PTA 04/24/19 3:58 PM   [facility] [address]  [place], [place], [address] Phone: [phone]   Fax:  [phone]  Name: Brandi Lutz MRN: [id number] Date of Birth: 04/06/1976

## 2019-04-26 ENCOUNTER — Ambulatory Visit: Payer: Managed Care, Other (non HMO) | Admitting: Physical Therapy

## 2019-04-26 ENCOUNTER — Other Ambulatory Visit: Payer: Self-pay

## 2019-04-26 ENCOUNTER — Encounter: Payer: Self-pay | Admitting: Physical Therapy

## 2019-04-26 DIAGNOSIS — R262 Difficulty in walking, not elsewhere classified: Secondary | ICD-10-CM

## 2019-04-26 DIAGNOSIS — M25562 Pain in left knee: Secondary | ICD-10-CM | POA: Diagnosis not present

## 2019-04-26 DIAGNOSIS — M6281 Muscle weakness (generalized): Secondary | ICD-10-CM

## 2019-04-26 DIAGNOSIS — M25662 Stiffness of left knee, not elsewhere classified: Secondary | ICD-10-CM

## 2019-04-26 NOTE — Therapy (Signed)
[facility] [address]  [place], [place], [address] Phone: [phone]   Fax:  [phone]  Physical Therapy Treatment  Patient Details  Name: Brandi Lutz MRN: [id number] Date of Birth: 12/29/1975 Referring Provider (PT): Frankey Shown, MD   Encounter Date: 04/26/2019  PT End of Session - 04/26/19 1408    Visit Number  21    Number of Visits  23    Date for PT Re-Evaluation  05/03/19    Authorization Type  [employer]    PT Start Time  1323    PT Stop Time  1401    PT Time Calculation (min)  38 min    Activity Tolerance  Patient tolerated treatment well    Behavior During Therapy  [facility] for tasks assessed/performed       Past Medical History:  Diagnosis Date  . Anxiety   . Arthritis   . Cancer ([place])    colon  . Depression    takes Wellbutrin and Zoloft daily  . Diabetes mellitus without complication ([place])    diet and gastric surgery controlled  . Gastric bypass status for obesity 02/05/2013  . Headache   . History of colon polyps    benign  . History of migraine    none since high school  . Joint pain   . Joint swelling   . Other specified iron deficiency anemias 02/05/2013   2 iron infusions since gastric bypass;no abnormal reaction noted  . PONV (postoperative nausea and vomiting)   . 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 APPENDECTOMY    . ARTHROSCOPIC REPAIR ACL Left 20 yrs ago  . CHOLECYSTECTOMY    . COLECTOMY    . COLONOSCOPY    . GASTRIC BYPASS    . herniated bowel repair    . KNEE ARTHROSCOPY Left   . PARTIAL KNEE ARTHROPLASTY Left 08/05/2014   Procedure: LEFT UNICOMPARTMENTAL KNEE;  Surgeon: Marianna Payment, MD;  Location: [facility];  Service: Orthopedics;  Laterality: Left;  . TOTAL KNEE ARTHROPLASTY Left 01/29/2019  . TOTAL KNEE REVISION Left 01/29/2019   Procedure: CONVERSION OF LEFT UNICOMPARTMENTAL KNEE ARTHROPLASTY  TO LEFT TOTAL KNEE ARTHROPLASTY;  Surgeon: Leandrew Koyanagi, MD;  Location: [facility];  Service: Orthopedics;  Laterality: Left;    There were no vitals filed for this visit.  Subjective Assessment - 04/26/19 1324    Subjective  Noticing tenderness in her L hamstring, esp at night.    Pertinent History  colon CA, L UKA 2016, anemia, migraines, gastric bypass, DM, depression, anxiety, L knee arthroscopy, L ACL repair    Patient Stated Goals  be able to climb the stairs so that i can work from home    Currently in Pain?  Yes    Pain Score  2     Pain Location  Knee    Pain Orientation  Left;Posterior    Pain Descriptors / Indicators  Discomfort    Pain Type  Acute pain                       [facility] Adult PT Treatment/Exercise - 04/26/19 0001      Knee/Hip Exercises: Stretches   Passive Hamstring Stretch  Left;30 seconds;2 reps    Passive Hamstring Stretch Limitations  supine with strap      Knee/Hip Exercises: Aerobic   Recumbent Bike  lvl 1, 6  min - full revolutions       Knee/Hip Exercises: Standing   Forward Step Up  Left;10 reps;Step Height: 8";Hand Hold: 1    Forward Step Up Limitations  at TM; step up/back    Step Down  Left;10 reps;Hand Hold: 1;Step Height: 4"    Step Down Limitations  at TM rail; step up/over    SLS with Vectors  L LE to 5 cones with 1 ski pole 4 rounds      Knee/Hip Exercises: Seated   Hamstring Curl  Strengthening;Left;10 reps;2 sets    Hamstring Limitations  c/o lack of control with iuncreased reps      Knee/Hip Exercises: Prone   Hamstring Curl  1 set;10 reps    Hamstring Curl Limitations  1#      Manual Therapy   Manual Therapy  Soft tissue mobilization;Myofascial release    Manual therapy comments  prone    Soft tissue mobilization  L lateral HS and posterior knee             PT Education - 04/26/19 1408    Education Details  update to HEP    Person(s) Educated  Patient    Methods  Explanation;Demonstration;Tactile  cues;Verbal cues;Handout    Comprehension  Verbalized understanding;Returned demonstration       PT Short Term Goals - 04/05/19 1826      PT SHORT TERM GOAL #1   Title  Patient to be independent with initial HEP.    Time  3    Period  Weeks    Status  Achieved    Target Date  03/14/19        PT Long Term Goals - 04/24/19 1412      PT LONG TERM GOAL #1   Title  Patient to be independent with advanced HEP.    Time  4    Period  Weeks    Status  Partially Met   met for current     PT LONG TERM GOAL #2   Title  Patient to demonstrate L knee AROM/PROM 0-120 degrees.    Time  4    Period  Weeks    Status  Partially Met   AROM 0-118 degrees and PROM 0-122 degrees; met for passive     PT LONG TERM GOAL #3   Title  Patient to demonstrate reciprocal stair climbing up/down 13 steps with 1 handrail as needed with <3/10 pain and good stability.    Time  4    Period  Weeks    Status  Partially Met   04/24/19:  achieved aside from lack of stability at lateral hip with slight hip drop     PT LONG TERM GOAL #4   Title  Patient to demonstrate B LE strength >=4+/5.    Time  4    Period  Weeks    Status  Partially Met   04/24/19: remaining weakness in L HS however improved from when last tested     PT LONG TERM GOAL #5   Title  Patient to demonstrate symmetrical weight shift, knee flexion, and step length with ambulation with LRAD.    Time  4    Period  Weeks    Status  Partially Met   04/24/19;  still with somewhat limited L knee hip/knee flexion at times           Plan - 04/26/19 1409    Clinical Impression Statement  Patient reporting noticing tenderness in L hamstring,  especially at night. Notes that this pain has been ongoing but feels that she is more aware of it now that she isn't having as much knee pain. Began session with STM to L hamstring for hopeful improvement in pain levels. Patient demonstrating no soft tissue restriction in HS, but rather a small area of  restriction over superolateral aspect of posterior knee. Worked on sitting HS curls with patient remarking on lack of control with increased reps. Reporting difficulty with this exercise at home, thus administered green band to decrease level of resistance as patient reporting better ability with this band today. Demonstrated considerable UE reliance on ski pole with SLS activities and reported remaining hesitancy and fear of falling while walking on grass. Plan to improve patient's comfort while walking on compliant surfaces in future sessions. Patient reported understanding of addition to HEP and with no complaints at end of session.    Comorbidities  hx colon CA, L UKA 2016, anemia, migraines, gastric bypass, DM, depression, anxiety, L knee arthroscopy, L ACL repair    Rehab Potential  Good    PT Treatment/Interventions  ADLs/Self Care Home Management;Electrical Stimulation;Cryotherapy;Moist Heat;Balance training;Therapeutic exercise;Therapeutic activities;Functional mobility training;Stair training;Gait training;Ultrasound;Neuromuscular re-education;Patient/family education;Manual techniques;Vasopneumatic Device;Taping;Energy conservation;Dry needling;Passive range of motion;Scar mobilization    PT Next Visit Plan  gait training in grass for improved comfort    Consulted and Agree with Plan of Care  Patient       Patient will benefit from skilled therapeutic intervention in order to improve the following deficits and impairments:  Hypomobility, Increased edema, Decreased scar mobility, Decreased knowledge of precautions, Decreased activity tolerance, Decreased strength, Pain, Difficulty walking, Decreased balance, Decreased range of motion, Improper body mechanics, Postural dysfunction, Impaired flexibility  Visit Diagnosis: Acute pain of left knee  Stiffness of left knee, not elsewhere classified  Muscle weakness (generalized)  Difficulty in walking, not elsewhere classified     Problem  List Patient Active Problem List   Diagnosis Date Noted  . Status post total left knee replacement 01/29/2019  . Primary osteoarthritis of left knee 01/02/2019  . IDA (iron deficiency anemia) 03/18/2016  . 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 Chronic diarrhea 09/11/2015  . Family history of colon cancer 09/11/2015  . History of colon polyps 09/11/2015  . Status post left partial knee replacement 08/05/2014  . Other iron deficiency anemias 02/05/2013  . Gastric bypass status for obesity 02/05/2013     Janene Harvey, PT, DPT 04/26/19 2:12 PM   [facility] [address]  [place], [place], [address] Phone: [phone]   Fax:  [phone]  Name: KARA MELCHING MRN: [id number] Date of Birth: September 09, 1975

## 2019-05-01 ENCOUNTER — Ambulatory Visit: Payer: Managed Care, Other (non HMO)

## 2019-05-01 ENCOUNTER — Other Ambulatory Visit: Payer: Self-pay

## 2019-05-01 DIAGNOSIS — M25562 Pain in left knee: Secondary | ICD-10-CM | POA: Diagnosis not present

## 2019-05-01 DIAGNOSIS — M25662 Stiffness of left knee, not elsewhere classified: Secondary | ICD-10-CM

## 2019-05-01 DIAGNOSIS — R262 Difficulty in walking, not elsewhere classified: Secondary | ICD-10-CM

## 2019-05-01 DIAGNOSIS — M6281 Muscle weakness (generalized): Secondary | ICD-10-CM

## 2019-05-01 NOTE — Therapy (Signed)
[facility] [address]  [place], [place], [address] Phone: [phone]   Fax:  [phone]  Physical Therapy Treatment  Patient Details  Name: Brandi Lutz MRN: [id number] Date of Birth: 1976-05-09 Referring Provider (PT): Frankey Shown, MD   Encounter Date: 05/01/2019  PT End of Session - 05/01/19 1355    Visit Number  22    Number of Visits  23    Date for PT Re-Evaluation  05/03/19    Authorization Type  [employer]    PT Start Time  1316    PT Stop Time  1357    PT Time Calculation (min)  41 min    Activity Tolerance  Patient tolerated treatment well    Behavior During Therapy  [facility] for tasks assessed/performed       Past Medical History:  Diagnosis Date  . Anxiety   . Arthritis   . Cancer ([place])    colon  . Depression    takes Wellbutrin and Zoloft daily  . Diabetes mellitus without complication ([place])    diet and gastric surgery controlled  . Gastric bypass status for obesity 02/05/2013  . Headache   . History of colon polyps    benign  . History of migraine    none since high school  . Joint pain   . Joint swelling   . Other specified iron deficiency anemias 02/05/2013   2 iron infusions since gastric bypass;no abnormal reaction noted  . PONV (postoperative nausea and vomiting)   . 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 APPENDECTOMY    . ARTHROSCOPIC REPAIR ACL Left 20 yrs ago  . CHOLECYSTECTOMY    . COLECTOMY    . COLONOSCOPY    . GASTRIC BYPASS    . herniated bowel repair    . KNEE ARTHROSCOPY Left   . PARTIAL KNEE ARTHROPLASTY Left 08/05/2014   Procedure: LEFT UNICOMPARTMENTAL KNEE;  Surgeon: Marianna Payment, MD;  Location: [facility];  Service: Orthopedics;  Laterality: Left;  . TOTAL KNEE ARTHROPLASTY Left 01/29/2019  . TOTAL KNEE REVISION Left 01/29/2019   Procedure: CONVERSION OF LEFT UNICOMPARTMENTAL KNEE ARTHROPLASTY  TO LEFT TOTAL KNEE ARTHROPLASTY;  Surgeon: Leandrew Koyanagi, MD;  Location: [facility];  Service: Orthopedics;  Laterality: Left;    There were no vitals filed for this visit.  Subjective Assessment - 05/01/19 1322    Subjective  Notes she is still having some irritation under back of L knee.    Pertinent History  colon CA, L UKA 2016, anemia, migraines, gastric bypass, DM, depression, anxiety, L knee arthroscopy, L ACL repair    Patient Stated Goals  be able to climb the stairs so that i can work from home    Currently in Pain?  Yes    Pain Score  2    up to 6/10 at worst   Pain Location  Knee    Pain Orientation  Left;Posterior    Pain Descriptors / Indicators  Discomfort    Pain Type  Acute pain                       [facility] Adult PT Treatment/Exercise - 05/01/19 0001      Ambulation/Gait   Ambulation/Gait  Yes    Ambulation/Gait Assistance  5: Supervision    Ambulation/Gait Assistance Details  cues provided for heads up gait while ambulating in  grass x 300 ft and on slight slope down back     Ambulation Distance (Feet)  300 Feet    Assistive device  None    Ambulation Surface  Outdoor;Grass    Gait velocity  decreased   decreased gait speed with head down      Neuro Re-ed    Neuro Re-ed Details   Side stepping on blue foam balance beam x 2 laps down back with 1 ski pole support; Side stepping + cone nock over/righting x 4 cones on blue foam balance beam with 1 ski pole support      Knee/Hip Exercises: Stretches   Passive Hamstring Stretch  Left;30 seconds;2 reps    Passive Hamstring Stretch Limitations  supine with strap    Quad Stretch  Left;1 rep;60 seconds    Quad Stretch Limitations  prone with strap + bolster under thigh     Gastroc Stretch  Left;2 reps;30 seconds    Gastroc Stretch Limitations  supine with strap and standing runners stretch       Knee/Hip Exercises: Aerobic   Recumbent Bike  lvl 1, 6 min - full revolutions       Knee/Hip Exercises: Standing    Forward Lunges  Right;Left;10 reps    Forward Lunges Limitations  shallow lunge TRX      Manual Therapy   Manual Therapy  Soft tissue mobilization    Manual therapy comments  prone    Soft tissue mobilization  L lateral HS and posterior knee               PT Short Term Goals - 04/05/19 1826      PT SHORT TERM GOAL #1   Title  Patient to be independent with initial HEP.    Time  3    Period  Weeks    Status  Achieved    Target Date  03/14/19        PT Long Term Goals - 04/24/19 1412      PT LONG TERM GOAL #1   Title  Patient to be independent with advanced HEP.    Time  4    Period  Weeks    Status  Partially Met   met for current     PT LONG TERM GOAL #2   Title  Patient to demonstrate L knee AROM/PROM 0-120 degrees.    Time  4    Period  Weeks    Status  Partially Met   AROM 0-118 degrees and PROM 0-122 degrees; met for passive     PT LONG TERM GOAL #3   Title  Patient to demonstrate reciprocal stair climbing up/down 13 steps with 1 handrail as needed with <3/10 pain and good stability.    Time  4    Period  Weeks    Status  Partially Met   04/24/19:  achieved aside from lack of stability at lateral hip with slight hip drop     PT LONG TERM GOAL #4   Title  Patient to demonstrate B LE strength >=4+/5.    Time  4    Period  Weeks    Status  Partially Met   04/24/19: remaining weakness in L HS however improved from when last tested     PT LONG TERM GOAL #5   Title  Patient to demonstrate symmetrical weight shift, knee flexion, and step length with ambulation with LRAD.    Time  4    Period  Weeks  Status  Partially Met   04/24/19;  still with somewhat limited L knee hip/knee flexion at times           Plan - 05/01/19 1334    Clinical Impression Statement  Pt. doing well today.  Does note she would like to work on her balance today to improve comfort while ambulating on surfaces such as grass.  Session began with gait without AD in grass  with pt. with decreased gait speed and heads down posture.  Duration of session focused on standing balance on compliant surfaces and pt. tolerated well.  Does have difficulty maintaining balance on foam with L SLS.  Ended session with pt. pain free thus modalities deferred.    Comorbidities  hx colon CA, L UKA 2016, anemia, migraines, gastric bypass, DM, depression, anxiety, L knee arthroscopy, L ACL repair    Rehab Potential  Good    PT Treatment/Interventions  ADLs/Self Care Home Management;Electrical Stimulation;Cryotherapy;Moist Heat;Balance training;Therapeutic exercise;Therapeutic activities;Functional mobility training;Stair training;Gait training;Ultrasound;Neuromuscular re-education;Patient/family education;Manual techniques;Vasopneumatic Device;Taping;Energy conservation;Dry needling;Passive range of motion;Scar mobilization    Consulted and Agree with Plan of Care  Patient       Patient will benefit from skilled therapeutic intervention in order to improve the following deficits and impairments:  Hypomobility, Increased edema, Decreased scar mobility, Decreased knowledge of precautions, Decreased activity tolerance, Decreased strength, Pain, Difficulty walking, Decreased balance, Decreased range of motion, Improper body mechanics, Postural dysfunction, Impaired flexibility  Visit Diagnosis: Acute pain of left knee  Stiffness of left knee, not elsewhere classified  Muscle weakness (generalized)  Difficulty in walking, not elsewhere classified     Problem List Patient Active Problem List   Diagnosis Date Noted  . Status post total left knee replacement 01/29/2019  . Primary osteoarthritis of left knee 01/02/2019  . IDA (iron deficiency anemia) 03/18/2016  . 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 Chronic diarrhea 09/11/2015  . Family history of colon cancer 09/11/2015  . History of colon polyps 09/11/2015  . Status post left partial knee replacement 08/05/2014  . Other iron  deficiency anemias 02/05/2013  . Gastric bypass status for obesity 02/05/2013    Bess Harvest, PTA 05/01/19 [facility] [address]  [place], [place], [address] Phone: [phone]   Fax:  [phone]  Name: Brandi Lutz MRN: [id number] Date of Birth: Jul 23, 1975

## 2019-05-03 ENCOUNTER — Encounter: Payer: Self-pay | Admitting: Physical Therapy

## 2019-05-03 ENCOUNTER — Ambulatory Visit: Payer: Managed Care, Other (non HMO) | Admitting: Physical Therapy

## 2019-05-03 ENCOUNTER — Other Ambulatory Visit: Payer: Self-pay

## 2019-05-03 DIAGNOSIS — M25662 Stiffness of left knee, not elsewhere classified: Secondary | ICD-10-CM

## 2019-05-03 DIAGNOSIS — M25562 Pain in left knee: Secondary | ICD-10-CM | POA: Diagnosis not present

## 2019-05-03 DIAGNOSIS — R262 Difficulty in walking, not elsewhere classified: Secondary | ICD-10-CM

## 2019-05-03 DIAGNOSIS — M6281 Muscle weakness (generalized): Secondary | ICD-10-CM

## 2019-05-03 NOTE — Therapy (Signed)
[facility] [address]  [place], [place], [address] Phone: [phone]   Fax:  [phone]  Physical Therapy Treatment  Patient Details  Name: Brandi Lutz MRN: [id number] Date of Birth: [birth year] Referring Provider (PT): Frankey Shown, MD   Encounter Date: 05/03/2019  PT End of Session - 05/03/19 1354    Visit Number  23    Number of Visits  27    Date for PT Re-Evaluation  05/31/19    Authorization Type  [employer]    PT Start Time  1315    PT Stop Time  1348   pt requested to leave early   PT Time Calculation (min)  33 min    Activity Tolerance  Patient tolerated treatment well    Behavior During Therapy  [facility] for tasks assessed/performed       Past Medical History:  Diagnosis Date  . Anxiety   . Arthritis   . Cancer ([place])    colon  . Depression    takes Wellbutrin and Zoloft daily  . Diabetes mellitus without complication ([place])    diet and gastric surgery controlled  . Gastric bypass status for obesity 02/05/2013  . Headache   . History of colon polyps    benign  . History of migraine    none since high school  . Joint pain   . Joint swelling   . Other specified iron deficiency anemias 02/05/2013   2 iron infusions since gastric bypass;no abnormal reaction noted  . PONV (postoperative nausea and vomiting)   . 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 APPENDECTOMY    . ARTHROSCOPIC REPAIR ACL Left 20 yrs ago  . CHOLECYSTECTOMY    . COLECTOMY    . COLONOSCOPY    . GASTRIC BYPASS    . herniated bowel repair    . KNEE ARTHROSCOPY Left   . PARTIAL KNEE ARTHROPLASTY Left 08/05/2014   Procedure: LEFT UNICOMPARTMENTAL KNEE;  Surgeon: Marianna Payment, MD;  Location: [facility];  Service: Orthopedics;  Laterality: Left;  . TOTAL KNEE ARTHROPLASTY Left 01/29/2019  . TOTAL KNEE REVISION Left 01/29/2019   Procedure: CONVERSION OF LEFT  UNICOMPARTMENTAL KNEE ARTHROPLASTY TO LEFT TOTAL KNEE ARTHROPLASTY;  Surgeon: Leandrew Koyanagi, MD;  Location: [facility];  Service: Orthopedics;  Laterality: Left;    There were no vitals filed for this visit.  Subjective Assessment - 05/03/19 1316    Subjective  Feels that she would like additional sessions to work on baalnce as she still feels off balance walking in the grass or other complaint surfaces and inclines. This limits her ability to walk her dogs. Also feels worried about getting up out of the tub when taking a bath. Reports 93% improvement thus far.    Pertinent History  colon CA, L UKA 2016, anemia, migraines, gastric bypass, DM, depression, anxiety, L knee arthroscopy, L ACL repair    Patient Stated Goals  be able to climb the stairs so that i can work from home    Currently in Pain?  Yes    Pain Score  2     Pain Location  Knee    Pain Orientation  Left;Posterior    Pain Descriptors / Indicators  Discomfort    Pain Type  Acute pain         [facility] PT Assessment - 05/03/19 0001      Assessment  Medical Diagnosis  s/p L TKA    Referring Provider (PT)  Frankey Shown, MD    Onset Date/Surgical Date  01/29/19      Observation/Other Assessments   Focus on Therapeutic Outcomes (FOTO)   Knee: 57% (43% limitation)   *error was made during scoring     AROM   Left Knee Extension  1    Left Knee Flexion  120      PROM   Left Knee Extension  0    Left Knee Flexion  123      Strength   Right Hip Flexion  5/5    Right Hip ABduction  4+/5    Right Hip ADduction  4+/5    Left Hip Flexion  5/5    Left Hip ABduction  4+/5    Left Hip ADduction  4+/5    Right Knee Flexion  5/5    Right Knee Extension  5/5    Left Knee Flexion  4+/5   mild pain   Left Knee Extension  5/5    Right Ankle Dorsiflexion  5/5    Right Ankle Plantar Flexion  4+/5    Left Ankle Dorsiflexion  5/5    Left Ankle Plantar Flexion  4+/5      Ambulation/Gait   Gait Comments  demonstrating anterior trunk  lean when ascending steps, apprehension and use of toe-to-heel pattern when descending; demonstrated decreased arm swing and decreased L TKE at heel strike      Standardized Balance Assessment   Standardized Balance Assessment  --   scored 24/30 on FGA without AD                  [facility] Adult PT Treatment/Exercise - 05/03/19 0001      Ambulation/Gait   Ambulation/Gait  Yes    Ambulation/Gait Assistance  7: Independent    Ambulation Distance (Feet)  300 Feet    Assistive device  None    Stairs  Yes    Stairs Assistance  5: Supervision    Stair Management Technique  One rail Right;Alternating pattern;One rail Left    Number of Stairs  13      Knee/Hip Exercises: Stretches   Sports administrator  Left;1 rep;30 seconds    Quad Stretch Limitations  prone with strap    Hip Flexor Stretch  Left;1 rep;30 seconds    Hip Flexor Stretch Limitations  mod thomas stretch to tolerance      Knee/Hip Exercises: Aerobic   Recumbent Bike  lvl 2, 6 min - full revolutions              PT Education - 05/03/19 1353    Education Details  discussion on objective measurements and remaining impairments    Person(s) Educated  Patient    Methods  Explanation;Demonstration;Tactile cues;Verbal cues    Comprehension  Verbalized understanding       PT Short Term Goals - 05/03/19 1320      PT SHORT TERM GOAL #1   Title  Patient to be independent with initial HEP.    Time  3    Period  Weeks    Status  Achieved    Target Date  03/14/19        PT Long Term Goals - 05/03/19 1320      PT LONG TERM GOAL #1   Title  Patient to be independent with advanced HEP.    Time  4    Period  Weeks  Status  Partially Met   met for current   Target Date  05/31/19      PT LONG TERM GOAL #2   Title  Patient to demonstrate L knee AROM/PROM 0-120 degrees.    Time  4    Period  Weeks    Status  Partially Met   AROM 1-120 degrees and PROM 0-123 degrees; met for passive   Target Date  05/31/19       PT LONG TERM GOAL #3   Title  Patient to demonstrate reciprocal stair climbing up/down 13 steps with 1 handrail as needed with <3/10 pain and good stability.    Time  4    Period  Weeks    Status  Partially Met   demonstrating anterior trunk lean when ascending steps, apprehension and use of toe-to-heel pattern when descending   Target Date  05/31/19      PT LONG TERM GOAL #4   Title  Patient to demonstrate B LE strength >=4+/5.    Time  4    Period  Weeks    Status  Achieved      PT LONG TERM GOAL #5   Title  Patient to demonstrate symmetrical weight shift, knee flexion, and step length with ambulation with LRAD.    Time  4    Period  Weeks    Status  Partially Met   demonstrated decreased arm swing and decreased L TKE at heel strike   Target Date  05/31/19      Additional Long Term Goals   Additional Long Term Goals  Yes      PT LONG TERM GOAL #6   Title  Patient to report 75% improvement in confidence with her balance when walking her dog.    Time  4    Period  Weeks    Status  New    Target Date  05/31/19            Plan - 05/03/19 1354    Clinical Impression Statement  Patient reporting 93% improvement since initial eval. Does note that she would like to continue working towards regaining her balance as this is still something she struggles with. Would like to continue working on walking on compliant surfaces and being able to transfer out of her bathtub. Patient now reaching L knee AROM 1-120 degrees and PROM 0-123 degrees. Patient has now met strength goal, with all muscle groups reaching at least 4+/5. Patient still demonstrating anterior trunk lean when ascending steps and with apprehension, pain, and use of toe-to-heel pattern when descending stairs.  Patient's gait pattern is normalizing, however still demonstrating decreased arm swing and decreased L TKE at heel strike. Patient scored 24/30 on FGA without AD, demonstrating decreased risk of falls. Patient is on  her way to reaching all goals. Would benefit from additional skilled PT services 1x/week for 4 weeks to address remaining goals and return to PLOF.    Comorbidities  hx colon CA, L UKA 2016, anemia, migraines, gastric bypass, DM, depression, anxiety, L knee arthroscopy, L ACL repair    Rehab Potential  Good    PT Treatment/Interventions  ADLs/Self Care Home Management;Electrical Stimulation;Cryotherapy;Moist Heat;Balance training;Therapeutic exercise;Therapeutic activities;Functional mobility training;Stair training;Gait training;Ultrasound;Neuromuscular re-education;Patient/family education;Manual techniques;Vasopneumatic Device;Taping;Energy conservation;Dry needling;Passive range of motion;Scar mobilization    PT Next Visit Plan  progress balance and stair training; knee extension ROM    Consulted and Agree with Plan of Care  Patient       Patient will benefit  from skilled therapeutic intervention in order to improve the following deficits and impairments:  Hypomobility, Increased edema, Decreased scar mobility, Decreased knowledge of precautions, Decreased activity tolerance, Decreased strength, Pain, Difficulty walking, Decreased balance, Decreased range of motion, Improper body mechanics, Postural dysfunction, Impaired flexibility  Visit Diagnosis: Acute pain of left knee  Stiffness of left knee, not elsewhere classified  Muscle weakness (generalized)  Difficulty in walking, not elsewhere classified     Problem List Patient Active Problem List   Diagnosis Date Noted  . Status post total left knee replacement 01/29/2019  . Primary osteoarthritis of left knee 01/02/2019  . IDA (iron deficiency anemia) 03/18/2016  . 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 Chronic diarrhea 09/11/2015  . Family history of colon cancer 09/11/2015  . History of colon polyps 09/11/2015  . Status post left partial knee replacement 08/05/2014  . Other iron deficiency anemias 02/05/2013  . Gastric bypass  status for obesity 02/05/2013     Janene Harvey, PT, DPT 05/03/19 4:32 PM   [facility] [address]  [place], [place], [address] Phone: [phone]   Fax:  [phone]  Name: Brandi Lutz MRN: [id number] Date of Birth: April 17, 1976

## 2019-05-08 ENCOUNTER — Ambulatory Visit: Payer: Managed Care, Other (non HMO)

## 2019-05-08 ENCOUNTER — Other Ambulatory Visit: Payer: Self-pay

## 2019-05-08 DIAGNOSIS — R262 Difficulty in walking, not elsewhere classified: Secondary | ICD-10-CM

## 2019-05-08 DIAGNOSIS — M6281 Muscle weakness (generalized): Secondary | ICD-10-CM

## 2019-05-08 DIAGNOSIS — M25562 Pain in left knee: Secondary | ICD-10-CM

## 2019-05-08 DIAGNOSIS — M25662 Stiffness of left knee, not elsewhere classified: Secondary | ICD-10-CM

## 2019-05-08 NOTE — Therapy (Signed)
[facility] [address]  [place], [place], [address] Phone: [phone]   Fax:  [phone]  Physical Therapy Treatment  Patient Details  Name: Brandi Lutz MRN: [id number] Date of Birth: [birth year] Referring Provider (PT): Frankey Shown, MD   Encounter Date: 05/08/2019  PT End of Session - 05/08/19 1503    Visit Number  24    Number of Visits  27    Date for PT Re-Evaluation  05/31/19    Authorization Type  [employer]    PT Start Time  1448    PT Stop Time  1530    PT Time Calculation (min)  42 min    Activity Tolerance  Patient tolerated treatment well    Behavior During Therapy  [facility] for tasks assessed/performed       Past Medical History:  Diagnosis Date  . Anxiety   . Arthritis   . Cancer ([place])    colon  . Depression    takes Wellbutrin and Zoloft daily  . Diabetes mellitus without complication ([place])    diet and gastric surgery controlled  . Gastric bypass status for obesity 02/05/2013  . Headache   . History of colon polyps    benign  . History of migraine    none since high school  . Joint pain   . Joint swelling   . Other specified iron deficiency anemias 02/05/2013   2 iron infusions since gastric bypass;no abnormal reaction noted  . PONV (postoperative nausea and vomiting)   . 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 APPENDECTOMY    . ARTHROSCOPIC REPAIR ACL Left 20 yrs ago  . CHOLECYSTECTOMY    . COLECTOMY    . COLONOSCOPY    . GASTRIC BYPASS    . herniated bowel repair    . KNEE ARTHROSCOPY Left   . PARTIAL KNEE ARTHROPLASTY Left 08/05/2014   Procedure: LEFT UNICOMPARTMENTAL KNEE;  Surgeon: Marianna Payment, MD;  Location: [facility];  Service: Orthopedics;  Laterality: Left;  . TOTAL KNEE ARTHROPLASTY Left 01/29/2019  . TOTAL KNEE REVISION Left 01/29/2019   Procedure: CONVERSION OF LEFT UNICOMPARTMENTAL KNEE ARTHROPLASTY  TO LEFT TOTAL KNEE ARTHROPLASTY;  Surgeon: Leandrew Koyanagi, MD;  Location: [facility];  Service: Orthopedics;  Laterality: Left;    There were no vitals filed for this visit.  Subjective Assessment - 05/08/19 1458    Subjective  Pt. reporting some posterior knee irritation.  Considering speaking with MD regarding this persistent posterior knee pain.    Pertinent History  colon CA, L UKA 2016, anemia, migraines, gastric bypass, DM, depression, anxiety, L knee arthroscopy, L ACL repair    Patient Stated Goals  be able to climb the stairs so that i can work from home    Currently in Pain?  No/denies    Pain Score  0-No pain   up to 5/10 at worst   Pain Location  Knee    Pain Orientation  Left;Posterior;Lateral    Pain Descriptors / Indicators  Discomfort    Pain Type  Acute pain    Pain Onset  More than a month ago    Aggravating Factors   prolonged walking, prolonged sitting    Multiple Pain Sites  No                       [facility] Adult PT Treatment/Exercise - 05/08/19 0001  Neuro Re-ed    Neuro Re-ed Details   Side stepping on blue foam balance beam x 2 laps down back with 1 ski pole support; Side stepping + cone nock over/righting x 7 cones on blue foam balance beam with 1 ski pole support      Knee/Hip Exercises: Aerobic   Elliptical  3 min, lvl 1    Recumbent Bike  --    Nustep  3 min, lvl 3      Knee/Hip Exercises: Standing   Lateral Step Up  Left;10 reps;Hand Hold: 1;Step Height: 8"    Lateral Step Up Limitations  in stair well     Forward Step Up  Left;10 reps;Step Height: 8";Hand Hold: 1    Forward Step Up Limitations  in stair well     SLS with Vectors  B SLS + LE cone toe tap to cones 5 x 3 cones; 1 ski pole     Other Standing Knee Exercises  Side stepping with red looped TB at ankles 2 x 30 sec       Knee/Hip Exercises: Supine   Bridges  Both;10 reps    Bridges Limitations  HS bridge                PT Short Term Goals - 05/03/19 1320      PT  SHORT TERM GOAL #1   Title  Patient to be independent with initial HEP.    Time  3    Period  Weeks    Status  Achieved    Target Date  03/14/19        PT Long Term Goals - 05/03/19 1320      PT LONG TERM GOAL #1   Title  Patient to be independent with advanced HEP.    Time  4    Period  Weeks    Status  Partially Met   met for current   Target Date  05/31/19      PT LONG TERM GOAL #2   Title  Patient to demonstrate L knee AROM/PROM 0-120 degrees.    Time  4    Period  Weeks    Status  Partially Met   AROM 1-120 degrees and PROM 0-123 degrees; met for passive   Target Date  05/31/19      PT LONG TERM GOAL #3   Title  Patient to demonstrate reciprocal stair climbing up/down 13 steps with 1 handrail as needed with <3/10 pain and good stability.    Time  4    Period  Weeks    Status  Partially Met   demonstrating anterior trunk lean when ascending steps, apprehension and use of toe-to-heel pattern when descending   Target Date  05/31/19      PT LONG TERM GOAL #4   Title  Patient to demonstrate B LE strength >=4+/5.    Time  4    Period  Weeks    Status  Achieved      PT LONG TERM GOAL #5   Title  Patient to demonstrate symmetrical weight shift, knee flexion, and step length with ambulation with LRAD.    Time  4    Period  Weeks    Status  Partially Met   demonstrated decreased arm swing and decreased L TKE at heel strike   Target Date  05/31/19      Additional Long Term Goals   Additional Long Term Goals  Yes      PT  LONG TERM GOAL #6   Title  Patient to report 75% improvement in confidence with her balance when walking her dog.    Time  4    Period  Weeks    Status  New    Target Date  05/31/19            Plan - 05/08/19 1509    Clinical Impression Statement  Pt. doing well today.  Primary complaint is ongoing L posterior/lateral knee pain which seems to be in area of distal lateral HS.  Pt. mild/mod ttp in this area with STM and occasionally  irritated with increased pain with step-up activities, HS bridge in session.  Pain seems to be short-lasting however has recently become pt.'s primary complaint and seems slow to respond to MT.  This pain did not limit pt. with balance and stepping activities today.  Pt. demonstrating most difficulty with SLS on compliant surface with dynamic stepping activities.  Will continue to progress toward goals.    Comorbidities  hx colon CA, L UKA 2016, anemia, migraines, gastric bypass, DM, depression, anxiety, L knee arthroscopy, L ACL repair    Rehab Potential  Good    PT Treatment/Interventions  ADLs/Self Care Home Management;Electrical Stimulation;Cryotherapy;Moist Heat;Balance training;Therapeutic exercise;Therapeutic activities;Functional mobility training;Stair training;Gait training;Ultrasound;Neuromuscular re-education;Patient/family education;Manual techniques;Vasopneumatic Device;Taping;Energy conservation;Dry needling;Passive range of motion;Scar mobilization    PT Next Visit Plan  progress balance and stair training; knee extension ROM    Consulted and Agree with Plan of Care  Patient       Patient will benefit from skilled therapeutic intervention in order to improve the following deficits and impairments:  Hypomobility, Increased edema, Decreased scar mobility, Decreased knowledge of precautions, Decreased activity tolerance, Decreased strength, Pain, Difficulty walking, Decreased balance, Decreased range of motion, Improper body mechanics, Postural dysfunction, Impaired flexibility  Visit Diagnosis: Acute pain of left knee  Stiffness of left knee, not elsewhere classified  Muscle weakness (generalized)  Difficulty in walking, not elsewhere classified     Problem List Patient Active Problem List   Diagnosis Date Noted  . Status post total left knee replacement 01/29/2019  . Primary osteoarthritis of left knee 01/02/2019  . IDA (iron deficiency anemia) 03/18/2016  . Malabsorption  of iron 03/18/2016  . Chronic diarrhea 09/11/2015  . Family history of colon cancer 09/11/2015  . History of colon polyps 09/11/2015  . Status post left partial knee replacement 08/05/2014  . Other iron deficiency anemias 02/05/2013  . Gastric bypass status for obesity 02/05/2013    Bess Harvest, PTA 05/08/19 5:39 PM    [facility] [address]  [place], [place], [address] Phone: [phone]   Fax:  [phone]  Name: Brandi Lutz MRN: [id number] Date of Birth: [birth year]

## 2019-05-16 ENCOUNTER — Other Ambulatory Visit: Payer: Self-pay

## 2019-05-16 ENCOUNTER — Ambulatory Visit: Payer: Managed Care, Other (non HMO) | Attending: Physician Assistant

## 2019-05-16 DIAGNOSIS — M25562 Pain in left knee: Secondary | ICD-10-CM | POA: Insufficient documentation

## 2019-05-16 DIAGNOSIS — M6281 Muscle weakness (generalized): Secondary | ICD-10-CM | POA: Diagnosis present

## 2019-05-16 DIAGNOSIS — R262 Difficulty in walking, not elsewhere classified: Secondary | ICD-10-CM

## 2019-05-16 DIAGNOSIS — M25662 Stiffness of left knee, not elsewhere classified: Secondary | ICD-10-CM | POA: Diagnosis present

## 2019-05-16 NOTE — Therapy (Signed)
[facility] [place] [address]  [place], [place], [address] Phone: [phone]   Fax:  [phone]  Physical Therapy Treatment  Patient Details  Name: Brandi Lutz MRN: [id number] Date of Birth: 1976/02/10 Referring Provider (PT): Frankey Shown, MD   Encounter Date: 05/16/2019  PT End of Session - 05/16/19 1548    Visit Number  25    Number of Visits  27    Date for PT Re-Evaluation  05/31/19    Authorization Type  [employer]    PT Start Time  1532    PT Stop Time  1615   Ended visit with 10 min moist heat   PT Time Calculation (min)  43 min    Activity Tolerance  Patient tolerated treatment well    Behavior During Therapy  [facility] for tasks assessed/performed       Past Medical History:  Diagnosis Date  . Anxiety   . Arthritis   . Cancer ([place])    colon  . Depression    takes Wellbutrin and Zoloft daily  . Diabetes mellitus without complication ([place])    diet and gastric surgery controlled  . Gastric bypass status for obesity 02/05/2013  . Headache   . History of colon polyps    benign  . History of migraine    none since high school  . Joint pain   . Joint swelling   . Other specified iron deficiency anemias 02/05/2013   2 iron infusions since gastric bypass;no abnormal reaction noted  . PONV (postoperative nausea and vomiting)   . 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 APPENDECTOMY    . ARTHROSCOPIC REPAIR ACL Left 20 yrs ago  . CHOLECYSTECTOMY    . COLECTOMY    . COLONOSCOPY    . GASTRIC BYPASS    . herniated bowel repair    . KNEE ARTHROSCOPY Left   . PARTIAL KNEE ARTHROPLASTY Left 08/05/2014   Procedure: LEFT UNICOMPARTMENTAL KNEE;  Surgeon: Marianna Payment, MD;  Location: [facility];  Service: Orthopedics;  Laterality: Left;  . TOTAL KNEE ARTHROPLASTY Left 01/29/2019  . TOTAL KNEE REVISION Left 01/29/2019   Procedure: CONVERSION OF  LEFT UNICOMPARTMENTAL KNEE ARTHROPLASTY TO LEFT TOTAL KNEE ARTHROPLASTY;  Surgeon: Leandrew Koyanagi, MD;  Location: [facility];  Service: Orthopedics;  Laterality: Left;    There were no vitals filed for this visit.  Subjective Assessment - 05/16/19 1542    Subjective  Pt. noting she feels she may be having a mild alleric reaction to her pain medication.  Planning on contacting MD regarding this.    Pertinent History  colon CA, L UKA 2016, anemia, migraines, gastric bypass, DM, depression, anxiety, L knee arthroscopy, L ACL repair    Patient Stated Goals  be able to climb the stairs so that i can work from home    Currently in Pain?  No/denies    Pain Score  0-No pain    Multiple Pain Sites  No         [facility] PT Assessment - 05/16/19 0001      AROM   Right/Left Knee  Left    Left Knee Extension  1    Left Knee Flexion  121      PROM   Right/Left Knee  Left    Left Knee Extension  0    Left Knee Flexion  125  [facility] Adult PT Treatment/Exercise - 05/16/19 0001      Knee/Hip Exercises: Stretches   Passive Hamstring Stretch  Left;30 seconds;2 reps    Passive Hamstring Stretch Limitations  supine with strap    Quad Stretch  Left;1 rep;30 seconds    Quad Stretch Limitations  prone with strap    Hip Flexor Stretch  Left;1 rep;30 seconds    Hip Flexor Stretch Limitations  mod thomas stretch to tolerance      Knee/Hip Exercises: Aerobic   Recumbent Bike  lvl 2, 6 min - full revolutions       Knee/Hip Exercises: Standing   Hip Flexion  Left;Right;10 reps;Knee bent;Stengthening    Hip Flexion Limitations  2# at counter     Hip Abduction  Right;Left;10 reps;Knee straight    Abduction Limitations  2# at counter     Hip Extension  Right;Left;10 reps;Knee straight;Stengthening    Extension Limitations  2# at counter     Step Down  Left;10 reps;Step Height: 6";Hand Hold: 2    Step Down Limitations  step down at machine - difficulty     Functional Squat  15 reps;3  seconds    Functional Squat Limitations  TRX    Other Standing Knee Exercises  Side stepping with red looped TB at ankles 2 x 40 sec     Other Standing Knee Exercises  Toe walking, heel walking x 40 ft each       Knee/Hip Exercises: Supine   Bridges with Ball Squeeze  Both;15 reps;Strengthening      Knee/Hip Exercises: Sidelying   Hip ABduction  Left;15 reps;Strengthening    Hip ABduction Limitations  cues for positioning required                PT Short Term Goals - 05/03/19 1320      PT SHORT TERM GOAL #1   Title  Patient to be independent with initial HEP.    Time  3    Period  Weeks    Status  Achieved    Target Date  03/14/19        PT Long Term Goals - 05/03/19 1320      PT LONG TERM GOAL #1   Title  Patient to be independent with advanced HEP.    Time  4    Period  Weeks    Status  Partially Met   met for current   Target Date  05/31/19      PT LONG TERM GOAL #2   Title  Patient to demonstrate L knee AROM/PROM 0-120 degrees.    Time  4    Period  Weeks    Status  Partially Met   AROM 1-120 degrees and PROM 0-123 degrees; met for passive   Target Date  05/31/19      PT LONG TERM GOAL #3   Title  Patient to demonstrate reciprocal stair climbing up/down 13 steps with 1 handrail as needed with <3/10 pain and good stability.    Time  4    Period  Weeks    Status  Partially Met   demonstrating anterior trunk lean when ascending steps, apprehension and use of toe-to-heel pattern when descending   Target Date  05/31/19      PT LONG TERM GOAL #4   Title  Patient to demonstrate B LE strength >=4+/5.    Time  4    Period  Weeks    Status  Achieved        PT LONG TERM GOAL #5   Title  Patient to demonstrate symmetrical weight shift, knee flexion, and step length with ambulation with LRAD.    Time  4    Period  Weeks    Status  Partially Met   demonstrated decreased arm swing and decreased L TKE at heel strike   Target Date  05/31/19      Additional  Long Term Goals   Additional Long Term Goals  Yes      PT LONG TERM GOAL #6   Title  Patient to report 75% improvement in confidence with her balance when walking her dog.    Time  4    Period  Weeks    Status  New    Target Date  05/31/19            Plan - 05/16/19 1621    Clinical Impression Statement  Doing well with only complaint being ongoing lateral HS tenderness which bothers her at night.  Tolerated progression of 6" step-downs focused on improving step control per pt. request.  Also focused on improving proximal hip strengthening with good tolerance.  Pt. verbalizing that she feels she will be ready to transition to home program at end of current POC.    Comorbidities  hx colon CA, L UKA 2016, anemia, migraines, gastric bypass, DM, depression, anxiety, L knee arthroscopy, L ACL repair    Rehab Potential  Good    PT Treatment/Interventions  ADLs/Self Care Home Management;Electrical Stimulation;Cryotherapy;Moist Heat;Balance training;Therapeutic exercise;Therapeutic activities;Functional mobility training;Stair training;Gait training;Ultrasound;Neuromuscular re-education;Patient/family education;Manual techniques;Vasopneumatic Device;Taping;Energy conservation;Dry needling;Passive range of motion;Scar mobilization    PT Next Visit Plan  progress balance and stair training; knee extension ROM    Consulted and Agree with Plan of Care  Patient       Patient will benefit from skilled therapeutic intervention in order to improve the following deficits and impairments:  Hypomobility, Increased edema, Decreased scar mobility, Decreased knowledge of precautions, Decreased activity tolerance, Decreased strength, Pain, Difficulty walking, Decreased balance, Decreased range of motion, Improper body mechanics, Postural dysfunction, Impaired flexibility  Visit Diagnosis: Acute pain of left knee  Stiffness of left knee, not elsewhere classified  Muscle weakness (generalized)  Difficulty  in walking, not elsewhere classified     Problem List Patient Active Problem List   Diagnosis Date Noted  . Status post total left knee replacement 01/29/2019  . Primary osteoarthritis of left knee 01/02/2019  . IDA (iron deficiency anemia) 03/18/2016  . 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 Chronic diarrhea 09/11/2015  . Family history of colon cancer 09/11/2015  . History of colon polyps 09/11/2015  . Status post left partial knee replacement 08/05/2014  . Other iron deficiency anemias 02/05/2013  . Gastric bypass status for obesity 02/05/2013    Micah Denny, PTA 05/16/19 4:51 PM   [facility] [place] [address]  [address] [place], , [address] Phone: [phone]   Fax:  [phone]  Name: Beatrice A Woon MRN: [id number] Date of Birth: 06/13/1976   

## 2019-05-23 ENCOUNTER — Encounter: Payer: Self-pay | Admitting: Physical Therapy

## 2019-05-23 ENCOUNTER — Ambulatory Visit: Payer: Managed Care, Other (non HMO) | Admitting: Physical Therapy

## 2019-05-23 ENCOUNTER — Other Ambulatory Visit: Payer: Self-pay

## 2019-05-23 DIAGNOSIS — M25662 Stiffness of left knee, not elsewhere classified: Secondary | ICD-10-CM

## 2019-05-23 DIAGNOSIS — M6281 Muscle weakness (generalized): Secondary | ICD-10-CM

## 2019-05-23 DIAGNOSIS — M25562 Pain in left knee: Secondary | ICD-10-CM | POA: Diagnosis not present

## 2019-05-23 DIAGNOSIS — R262 Difficulty in walking, not elsewhere classified: Secondary | ICD-10-CM

## 2019-05-23 NOTE — Patient Instructions (Signed)
   Kinesiology tape  What is kinesiology tape?  There are many brands of kinesiology tape. KTape, Rock Tape, Body Sport, Dynamic tape, to name a few.  It is an elasticized tape designed to support the body's natural healing process. This tape provides stability and support to muscles and joints without restricting motion.  It can also help decrease swelling in the area of application.  How does it work?  The tape microscopically lifts and decompresses the skin to allow for drainage of lymph (swelling) to flow away from area, reducing inflammation. The tape has the ability to help re-educate the neuromuscular system by targeting specific receptors in the skin. The presence of the tape increases the body's awareness of posture and body mechanics.  Do not use with:  . Open wounds . Skin lesions . Adhesive allergies  In some rare cases, mild/moderate skin irritation can occur. This can include redness, itchiness, or hives. If this occurs, immediately remove tape and consult your primary care physician if symptoms are severe or do not resolve within 2 days.  Safe removal of the tape:  To remove tape safely, hold nearby skin with one hand and gentle roll tape down with other hand. You can apply oil or conditioner to tape while in shower prior to removal to loosen adhesive. DO NOT swiftly rip tape off like a band-aid, as this could cause skin tears and additional skin irritation.     For questions, please contact your therapist at:  [facility] [place] [address]  [address] [place], [place], [address] Phone: [phone]   Fax:  [phone]     

## 2019-05-23 NOTE — Therapy (Signed)
[facility] [place] [address]  [place], [place], [address] Phone: [phone]   Fax:  [phone]  Physical Therapy Treatment  Patient Details  Name: Brandi Lutz MRN: [id number] Date of Birth: [birth year] Referring Provider (PT): Frankey Shown, MD   Encounter Date: 05/23/2019  PT End of Session - 05/23/19 1618    Visit Number  26    Number of Visits  27    Date for PT Re-Evaluation  05/31/19    Authorization Type  [employer]    PT Start Time  1530    PT Stop Time  1611    PT Time Calculation (min)  41 min    Activity Tolerance  Patient tolerated treatment well    Behavior During Therapy  [facility] for tasks assessed/performed       Past Medical History:  Diagnosis Date  . Anxiety   . Arthritis   . Cancer ([place])    colon  . Depression    takes Wellbutrin and Zoloft daily  . Diabetes mellitus without complication ([place])    diet and gastric surgery controlled  . Gastric bypass status for obesity 02/05/2013  . Headache   . History of colon polyps    benign  . History of migraine    none since high school  . Joint pain   . Joint swelling   . Other specified iron deficiency anemias 02/05/2013   2 iron infusions since gastric bypass;no abnormal reaction noted  . PONV (postoperative nausea and vomiting)   . 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 APPENDECTOMY    . ARTHROSCOPIC REPAIR ACL Left 20 yrs ago  . CHOLECYSTECTOMY    . COLECTOMY    . COLONOSCOPY    . GASTRIC BYPASS    . herniated bowel repair    . KNEE ARTHROSCOPY Left   . PARTIAL KNEE ARTHROPLASTY Left 08/05/2014   Procedure: LEFT UNICOMPARTMENTAL KNEE;  Surgeon: Marianna Payment, MD;  Location: [facility];  Service: Orthopedics;  Laterality: Left;  . TOTAL KNEE ARTHROPLASTY Left 01/29/2019  . TOTAL KNEE REVISION Left 01/29/2019   Procedure: CONVERSION OF LEFT UNICOMPARTMENTAL KNEE ARTHROPLASTY  TO LEFT TOTAL KNEE ARTHROPLASTY;  Surgeon: Leandrew Koyanagi, MD;  Location: [facility];  Service: Orthopedics;  Laterality: Left;    There were no vitals filed for this visit.  Subjective Assessment - 05/23/19 1529    Subjective  Sometimes having pain with weather changes. Still having HS pain, but this has improved. Still having stiffness when standing up after prolonged sitting. Has not had to take pain meds today.    Pertinent History  colon CA, L UKA 2016, anemia, migraines, gastric bypass, DM, depression, anxiety, L knee arthroscopy, L ACL repair    Patient Stated Goals  be able to climb the stairs so that i can work from home    Currently in Pain?  Yes    Pain Score  4     Pain Location  Knee    Pain Orientation  Left;Lateral;Posterior    Pain Descriptors / Indicators  Sharp;Dull    Pain Type  Acute pain                       [facility] Adult PT Treatment/Exercise - 05/23/19 0001      Neuro Re-ed    Neuro Re-ed Details   walking forward, backward, side stepping on red  mat with cognitive dual task x5 min; witth mat folded in half with pincushions in between x5 min   with CGA for safety     Knee/Hip Exercises: Stretches   Passive Hamstring Stretch  Left;30 seconds;2 reps    Passive Hamstring Stretch Limitations  sitting, foot elevated on step      Knee/Hip Exercises: Aerobic   Recumbent Bike  lvl 2, 6 min - full revolutions       Knee/Hip Exercises: Standing   Forward Step Up  Left;1 set;10 reps;Hand Hold: 0;Step Height: 6"    Forward Step Up Limitations  L step up + opposite SKTC   good stability   Other Standing Knee Exercises  alt toe tap on cone standing on foam 2x20   mild ankle instabilty but no LOB     Manual Therapy   Manual Therapy  Taping    Kinesiotex  Create Space      Kinesiotix   Create Space  L knee x pattern over lateral and medial HS at 30% stretch             PT Education - 05/23/19 1618    Education Details  update to HEP; edu on KT tape  use, wear time, removal    Person(s) Educated  Patient    Methods  Explanation;Demonstration;Tactile cues;Verbal cues;Handout    Comprehension  Verbalized understanding;Returned demonstration       PT Short Term Goals - 05/03/19 1320      PT SHORT TERM GOAL #1   Title  Patient to be independent with initial HEP.    Time  3    Period  Weeks    Status  Achieved    Target Date  03/14/19        PT Long Term Goals - 05/03/19 1320      PT LONG TERM GOAL #1   Title  Patient to be independent with advanced HEP.    Time  4    Period  Weeks    Status  Partially Met   met for current   Target Date  05/31/19      PT LONG TERM GOAL #2   Title  Patient to demonstrate L knee AROM/PROM 0-120 degrees.    Time  4    Period  Weeks    Status  Partially Met   AROM 1-120 degrees and PROM 0-123 degrees; met for passive   Target Date  05/31/19      PT LONG TERM GOAL #3   Title  Patient to demonstrate reciprocal stair climbing up/down 13 steps with 1 handrail as needed with <3/10 pain and good stability.    Time  4    Period  Weeks    Status  Partially Met   demonstrating anterior trunk lean when ascending steps, apprehension and use of toe-to-heel pattern when descending   Target Date  05/31/19      PT LONG TERM GOAL #4   Title  Patient to demonstrate B LE strength >=4+/5.    Time  4    Period  Weeks    Status  Achieved      PT LONG TERM GOAL #5   Title  Patient to demonstrate symmetrical weight shift, knee flexion, and step length with ambulation with LRAD.    Time  4    Period  Weeks    Status  Partially Met   demonstrated decreased arm swing and decreased L TKE at heel strike   Target Date  05/31/19      Additional Long Term Goals   Additional Long Term Goals  Yes      PT LONG TERM GOAL #6   Title  Patient to report 75% improvement in confidence with her balance when walking her dog.    Time  4    Period  Weeks    Status  New    Target Date  05/31/19             Plan - 05/23/19 1618    Clinical Impression Statement  Patient reporting lessened but still present, L posterolateral knee pain as well as knee stiffness after standing up from prolonged sitting. Taped L posterior knee along medial and lateral HS for hopeful improvement in pain levels. Patient did report slight immediate benefit. Worked on dynamic balance throughout remainder of session. Patient demonstrated good balance and balance recovery while on compliant surfaces today. Patient noting that she still has apprehension with these activities, but this is improving. Updated HEP with one additional balance exercise that was well-tolerated today. Patient reported understanding and without complaints at end of session. Patient is progression well and will likely be ready to wrap up next session.    Comorbidities  hx colon CA, L UKA 2016, anemia, migraines, gastric bypass, DM, depression, anxiety, L knee arthroscopy, L ACL repair    Rehab Potential  Good    PT Treatment/Interventions  ADLs/Self Care Home Management;Electrical Stimulation;Cryotherapy;Moist Heat;Balance training;Therapeutic exercise;Therapeutic activities;Functional mobility training;Stair training;Gait training;Ultrasound;Neuromuscular re-education;Patient/family education;Manual techniques;Vasopneumatic Device;Taping;Energy conservation;Dry needling;Passive range of motion;Scar mobilization    PT Next Visit Plan  progress balance and stair training; knee extension ROM    Consulted and Agree with Plan of Care  Patient       Patient will benefit from skilled therapeutic intervention in order to improve the following deficits and impairments:  Hypomobility, Increased edema, Decreased scar mobility, Decreased knowledge of precautions, Decreased activity tolerance, Decreased strength, Pain, Difficulty walking, Decreased balance, Decreased range of motion, Improper body mechanics, Postural dysfunction, Impaired flexibility  Visit  Diagnosis: Acute pain of left knee  Stiffness of left knee, not elsewhere classified  Muscle weakness (generalized)  Difficulty in walking, not elsewhere classified     Problem List Patient Active Problem List   Diagnosis Date Noted  . Status post total left knee replacement 01/29/2019  . Primary osteoarthritis of left knee 01/02/2019  . IDA (iron deficiency anemia) 03/18/2016  . 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 Chronic diarrhea 09/11/2015  . Family history of colon cancer 09/11/2015  . History of colon polyps 09/11/2015  . Status post left partial knee replacement 08/05/2014  . Other iron deficiency anemias 02/05/2013  . Gastric bypass status for obesity 02/05/2013     Brandi Lutz, PT, DPT 05/23/19 4:24 PM   [facility] [place] [address]  [place], [place], [address] Phone: [phone]   Fax:  [phone]  Name: Brandi Lutz MRN: [id number] Date of Birth: [birth year]

## 2019-05-29 ENCOUNTER — Other Ambulatory Visit: Payer: Self-pay

## 2019-05-29 ENCOUNTER — Inpatient Hospital Stay: Payer: Managed Care, Other (non HMO) | Attending: Family

## 2019-05-29 ENCOUNTER — Inpatient Hospital Stay (HOSPITAL_BASED_OUTPATIENT_CLINIC_OR_DEPARTMENT_OTHER): Payer: Managed Care, Other (non HMO) | Admitting: Family

## 2019-05-29 ENCOUNTER — Encounter: Payer: Self-pay | Admitting: Family

## 2019-05-29 VITALS — BP 130/92 | HR 104 | Temp 97.3°F | Wt 225.1 lb

## 2019-05-29 DIAGNOSIS — K909 Intestinal malabsorption, unspecified: Secondary | ICD-10-CM | POA: Insufficient documentation

## 2019-05-29 DIAGNOSIS — Z9884 Bariatric surgery status: Secondary | ICD-10-CM | POA: Diagnosis not present

## 2019-05-29 DIAGNOSIS — D509 Iron deficiency anemia, unspecified: Secondary | ICD-10-CM | POA: Insufficient documentation

## 2019-05-29 DIAGNOSIS — D508 Other iron deficiency anemias: Secondary | ICD-10-CM

## 2019-05-29 LAB — RETICULOCYTES
Immature Retic Fract: 4.5 % (ref 2.3–15.9)
RBC.: 4.82 MIL/uL (ref 3.87–5.11)
Retic Count, Absolute: 33.3 10*3/uL (ref 19.0–186.0)
Retic Ct Pct: 0.7 % (ref 0.4–3.1)

## 2019-05-29 LAB — CBC WITH DIFFERENTIAL (CANCER CENTER ONLY)
Abs Immature Granulocytes: 0.02 10*3/uL (ref 0.00–0.07)
Basophils Absolute: 0 10*3/uL (ref 0.0–0.1)
Basophils Relative: 1 %
Eosinophils Absolute: 0 10*3/uL (ref 0.0–0.5)
Eosinophils Relative: 1 %
HCT: 39 % (ref 36.0–46.0)
Hemoglobin: 12.7 g/dL (ref 12.0–15.0)
Immature Granulocytes: 1 %
Lymphocytes Relative: 38 %
Lymphs Abs: 1.1 10*3/uL (ref 0.7–4.0)
MCH: 26.5 pg (ref 26.0–34.0)
MCHC: 32.6 g/dL (ref 30.0–36.0)
MCV: 81.3 fL (ref 80.0–100.0)
Monocytes Absolute: 0.3 10*3/uL (ref 0.1–1.0)
Monocytes Relative: 9 %
Neutro Abs: 1.5 10*3/uL — ABNORMAL LOW (ref 1.7–7.7)
Neutrophils Relative %: 50 %
Platelet Count: 307 10*3/uL (ref 150–400)
RBC: 4.8 MIL/uL (ref 3.87–5.11)
RDW: 12.8 % (ref 11.5–15.5)
WBC Count: 3 10*3/uL — ABNORMAL LOW (ref 4.0–10.5)
nRBC: 0 % (ref 0.0–0.2)

## 2019-05-29 NOTE — Progress Notes (Signed)
Hematology and Oncology Follow Up Visit  Brandi Lutz [id number] 02-Mar-1976 43 y.o. 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 [name] doing well but still has some fatigue at times.  Her cycle is regular with normal flow lasting 4 days.  She has not noted any other loss of blood. No bruising or petechiae. She has had no issues with fever, chills, n/v, cough, rash, dizziness, SOB, chest pain, palpitations, abdominal pain or changes in bowel or bladder habits.  No swelling, tenderness, numbness or tingling in her extremities at this time.  No falls or syncope.  Her appetite lately comes and goes. [name] eating sporadically and staying hydrated. Her weight is stable.   ECOG Performance Status: 1 - Symptomatic but completely ambulatory  Medications:  Allergies as of 05/29/2019      Reactions   Tape Rash      Medication List       Accurate as of May 29, 2019 11:50 AM. If you have any questions, ask your nurse or doctor.        STOP taking these medications   clindamycin-tretinoin gel Commonly known as: ZIANA Stopped by: Laverna Peace, NP   HYDROcodone-acetaminophen 5-325 MG tablet Commonly known as: Norco Stopped by: Laverna Peace, NP   methocarbamol 750 MG tablet Commonly known as: ROBAXIN Stopped by: Laverna Peace, NP   ondansetron 4 MG tablet Commonly known as: ZOFRAN Stopped by: Laverna Peace, NP   oxyCODONE 5 MG immediate release tablet Commonly known as: Oxy IR/ROXICODONE Stopped by: Laverna Peace, NP   promethazine 25 MG tablet Commonly known as: PHENERGAN Stopped by: Laverna Peace, NP     TAKE these medications   aspirin EC 81 MG tablet Take 1 tablet (81 mg total) by mouth 2 (two) times  daily.   buPROPion 150 MG 24 hr tablet Commonly known as: WELLBUTRIN XL Take 150 mg by mouth daily.   diphenhydrAMINE 25 MG tablet Commonly known as: SOMINEX Take 25 mg by mouth at bedtime as needed for sleep.   letrozole 2.5 MG tablet Commonly known as: FEMARA Take 2.5 mg by mouth every morning.   multivitamin with minerals Tabs tablet Take 2 tablets by mouth daily.   phentermine 37.5 MG tablet Commonly known as: ADIPEX-P Take 37.5 mg by mouth daily.   sertraline 50 MG tablet Commonly known as: ZOLOFT Take 50 mg by mouth daily.   traMADol 50 MG tablet Commonly known as: ULTRAM Take 1-2 tabs po bid prn pain       Allergies:  Allergies  Allergen Reactions  . Tape Rash    Past Medical History, Surgical history, Social history, and Family History were reviewed and updated.  Review of Systems: All other 10 point review of systems is negative.   Physical Exam:  weight is 225 lb 1.6 oz (102.1 kg). Her oral temperature is 97.3 F (36.3 C) (abnormal). Her blood pressure is 130/92 (abnormal) and her pulse is 104 (abnormal). Her oxygen saturation is 100%.   Wt Readings from Last 3 Encounters:  05/29/19 225 lb 1.6 oz (102.1 kg)  02/27/19 238 lb (108 kg)  01/29/19 230 lb 3.2 oz (104.4 kg)    Ocular: Sclerae unicteric, pupils equal, round and reactive to light Ear-nose-throat: Oropharynx clear, dentition fair Lymphatic: No cervical  or supraclavicular adenopathy Lungs no rales or rhonchi, good excursion bilaterally Heart regular rate and rhythm, no murmur appreciated Abd soft, nontender, positive bowel sounds, no liver or spleen tip palpated on exam, no fluid wave  MSK no focal spinal tenderness, no joint edema Neuro: non-focal, well-oriented, appropriate affect Breasts: Deferred   Lab Results  Component Value Date   WBC 3.0 (L) 05/29/2019   HGB 12.7 05/29/2019   HCT 39.0 05/29/2019   MCV 81.3 05/29/2019   PLT 307 05/29/2019   Lab Results  Component Value  Date   FERRITIN 90 02/27/2019   IRON 111 02/27/2019   TIBC 334 02/27/2019   UIBC 223 02/27/2019   IRONPCTSAT 33 02/27/2019   Lab Results  Component Value Date   RETICCTPCT 0.7 05/29/2019   RBC 4.80 05/29/2019   RBC 4.82 05/29/2019   RETICCTABS 31.3 05/02/2015   No results found for: KPAFRELGTCHN, LAMBDASER, KAPLAMBRATIO No results found for: IGGSERUM, IGA, IGMSERUM No results found for: Ronnald Ramp, A1GS, Gilford Silvius, MSPIKE, SPEI   Chemistry      Component Value Date/Time   NA 140 01/30/2019 0310   K 4.1 01/30/2019 0310   CL 107 01/30/2019 0310   CO2 28 01/30/2019 0310   BUN 7 01/30/2019 0310   CREATININE 0.90 01/30/2019 0310      Component Value Date/Time   CALCIUM 8.2 (L) 01/30/2019 0310   ALKPHOS 69 01/26/2019 0904   AST 22 01/26/2019 0904   ALT 24 01/26/2019 0904   BILITOT 0.5 01/26/2019 0904       Impression and Plan: Brandi Lutz is a very pleasant 43 yo African American female withiron deficiency anemia secondary to malabsorption after gastric bypass. We will see what her iron studies show and bring her back in for infusion if needed.   We will go ahead and plan to see her back in another 3 months.  She will contact our office with any questions or concerns. We can certainly see her sooner if needed.   Laverna Peace, NP 12/15/202011:50 AM

## 2019-05-30 ENCOUNTER — Other Ambulatory Visit: Payer: Self-pay

## 2019-05-30 ENCOUNTER — Ambulatory Visit: Payer: Managed Care, Other (non HMO)

## 2019-05-30 DIAGNOSIS — M25662 Stiffness of left knee, not elsewhere classified: Secondary | ICD-10-CM

## 2019-05-30 DIAGNOSIS — R262 Difficulty in walking, not elsewhere classified: Secondary | ICD-10-CM

## 2019-05-30 DIAGNOSIS — M25562 Pain in left knee: Secondary | ICD-10-CM | POA: Diagnosis not present

## 2019-05-30 DIAGNOSIS — M6281 Muscle weakness (generalized): Secondary | ICD-10-CM

## 2019-05-30 LAB — IRON AND TIBC
Iron: 108 ug/dL (ref 41–142)
Saturation Ratios: 30 % (ref 21–57)
TIBC: 356 ug/dL (ref 236–444)
UIBC: 248 ug/dL (ref 120–384)

## 2019-05-30 LAB — FERRITIN: Ferritin: 19 ng/mL (ref 11–307)

## 2019-05-30 NOTE — Therapy (Addendum)
[facility] [address]  [place], [place], [address] Phone: [phone]   Fax:  [phone]  Physical Therapy Treatment  Patient Details  Name: Brandi Lutz MRN: [id number] Date of Birth: [birth year] Referring Provider (PT): Frankey Shown, MD   Encounter Date: 05/30/2019  PT End of Session - 05/30/19 1538    Visit Number  27    Number of Visits  27    Date for PT Re-Evaluation  05/31/19    Authorization Type  [employer]    PT Start Time  1532    PT Stop Time  1617    PT Time Calculation (min)  45 min    Activity Tolerance  Patient tolerated treatment well    Behavior During Therapy  [facility] for tasks assessed/performed       Past Medical History:  Diagnosis Date  . Anxiety   . Arthritis   . Cancer ([place])    colon  . Depression    takes Wellbutrin and Zoloft daily  . Diabetes mellitus without complication ([place])    diet and gastric surgery controlled  . Gastric bypass status for obesity 02/05/2013  . Headache   . History of colon polyps    benign  . History of migraine    none since high school  . Joint pain   . Joint swelling   . Other specified iron deficiency anemias 02/05/2013   2 iron infusions since gastric bypass;no abnormal reaction noted  . PONV (postoperative nausea and vomiting)   . 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 APPENDECTOMY    . ARTHROSCOPIC REPAIR ACL Left 20 yrs ago  . CHOLECYSTECTOMY    . COLECTOMY    . COLONOSCOPY    . GASTRIC BYPASS    . herniated bowel repair    . KNEE ARTHROSCOPY Left   . PARTIAL KNEE ARTHROPLASTY Left 08/05/2014   Procedure: LEFT UNICOMPARTMENTAL KNEE;  Surgeon: Marianna Payment, MD;  Location: [facility];  Service: Orthopedics;  Laterality: Left;  . TOTAL KNEE ARTHROPLASTY Left 01/29/2019  . TOTAL KNEE REVISION Left 01/29/2019   Procedure: CONVERSION OF LEFT UNICOMPARTMENTAL KNEE ARTHROPLASTY  TO LEFT TOTAL KNEE ARTHROPLASTY;  Surgeon: Leandrew Koyanagi, MD;  Location: [facility];  Service: Orthopedics;  Laterality: Left;    There were no vitals filed for this visit.  Subjective Assessment - 05/30/19 1537    Subjective  Pt. reporting some remaining irritation at L posterior lateral knee.    Pertinent History  colon CA, L UKA 2016, anemia, migraines, gastric bypass, DM, depression, anxiety, L knee arthroscopy, L ACL repair    Patient Stated Goals  be able to climb the stairs so that i can work from home    Currently in Pain?  Yes    Pain Score  2     Pain Location  Knee    Pain Orientation  Left    Pain Descriptors / Indicators  Aching;Dull    Pain Type  Acute pain    Pain Onset  More than a month ago    Pain Frequency  Intermittent    Aggravating Factors   going down stairs    Multiple Pain Sites  No         [facility] PT Assessment - 05/30/19 0001      Observation/Other Assessments   Focus on Therapeutic Outcomes (FOTO)   69% (31% limitation)  AROM   Right/Left Knee  Left    Left Knee Extension  0    Left Knee Flexion  122      PROM   Right/Left Knee  Left    Left Knee Extension  0    Left Knee Flexion  125                   [facility] Adult PT Treatment/Exercise - 05/30/19 0001      Self-Care   Self-Care  Other Self-Care Comments    Other Self-Care Comments   reviewed comprehensive HEP to check understanding of updated "master sheet" handout of ongoing 3x/week HEP activities       Knee/Hip Exercises: Stretches   Passive Hamstring Stretch  Left;30 seconds;2 reps    Passive Hamstring Stretch Limitations  supine with strap      Knee/Hip Exercises: Aerobic   Recumbent Bike  lvl 2, 8 min - full revolutions       Knee/Hip Exercises: Machines for Strengthening   Cybex Knee Extension  B LE's 15# x 10 rpes     Cybex Knee Flexion  B LE's 25# x 15 reps     Cybex Leg Press  B LE's: 35# x 20 resp       Knee/Hip Exercises: Standing   Forward Lunges  Right;Left;1  set;5 reps    Forward Lunges Limitations  at counter     Forward Step Up  Left;10 reps;Step Height: 8";Hand Hold: 2    Forward Step Up Limitations  2 ski poles     Functional Squat  15 reps;3 seconds    Functional Squat Limitations  TRX      Knee/Hip Exercises: Supine   Straight Leg Raises  Left;15 reps;Strengthening             PT Education - 05/30/19 1729    Education Details  ongoing HEP 3x/week : 3way hip kicker with red looped TB at ankles; counter squat, counter supported lunge,  seated HS stretch, forward step-up    Person(s) Educated  Patient    Methods  Explanation;Demonstration;Verbal cues;Handout    Comprehension  Verbalized understanding;Returned demonstration;Verbal cues required       PT Short Term Goals - 05/03/19 1320      PT SHORT TERM GOAL #1   Title  Patient to be independent with initial HEP.    Time  3    Period  Weeks    Status  Achieved    Target Date  03/14/19        PT Long Term Goals - 05/30/19 1540      PT LONG TERM GOAL #1   Title  Patient to be independent with advanced HEP.    Time  4    Period  Weeks    Status  Achieved   05/30/19     PT LONG TERM GOAL #2   Title  Patient to demonstrate L knee AROM/PROM 0-120 degrees.    Time  4    Period  Weeks    Status  Achieved   05/30/19:  L AROM 0-122 dg , L PROM 0-125 dg     PT LONG TERM GOAL #3   Title  Patient to demonstrate reciprocal stair climbing up/down 13 steps with 1 handrail as needed with <3/10 pain and good stability.    Time  4    Period  Weeks    Status  Partially Met   05/30/19: limited eccentric control which pt. attributes to L  lateral HS irritation; pain rising to 4/10 at times descending stairs     PT LONG TERM GOAL #4   Title  Patient to demonstrate B LE strength >=4+/5.    Time  4    Period  Weeks    Status  Achieved      PT LONG TERM GOAL #5   Title  Patient to demonstrate symmetrical weight shift, knee flexion, and step length with ambulation with LRAD.     Time  4    Period  Weeks    Status  Partially Met   05/30/19: still demonstrating limited armswing     PT LONG TERM GOAL #6   Title  Patient to report 75% improvement in confidence with her balance when walking her dog.    Time  4    Period  Weeks    Status  Achieved   05/30/19:  90 % improvement in confidence with this task           Plan - 05/30/19 1540    Clinical Impression Statement  Pt. has made good progress with physical therapy.  Made aware today is her last visit in current POC and feels she is comfortable transitioning to home program.  Supervising PT approving 30-day hold for pt. from PT.  Pt. has achieved LTG #1 independent with good understanding of ongoing strengthening and flexibility HEP to be performed at 3x/week (see pt. education section).  LTG #1 met.  Pt. able to achieve LTG #2 demonstrating L knee AROM 0-122 dg, PROM 0-125 dg.  Pt. partially achieved LTG #3 now navigating stairs ascending/descending reciprocally however with 4/10 pain at times at L posterior knee in lateral HS area which is relieved with rest.  Addressed this irritated HS area with LE stretching today and pt. verbalizing plans to continue this daily at home.  Pt. able to previously achieve LTG #4 demonstrating at least 4/5 B LE strength now grossly 4+/5/5/5 LE strength with MMT.  Pt. partially achieved LTG #5 demonstrating much improved symmetry with gait mechanics and B heel strike however still ambulating with reduced B arm swing.  Pt. able to achieve LTG #6 noting 90% improvement in confidence with her balance while walking dogs.  Ended visit today pain free thus modalities deferred.  Pt. now on 30-day hold from therapy per supervising PT approval.    Comorbidities  hx colon CA, L UKA 2016, anemia, migraines, gastric bypass, DM, depression, anxiety, L knee arthroscopy, L ACL repair    Rehab Potential  Good    PT Treatment/Interventions  ADLs/Self Care Home Management;Electrical  Stimulation;Cryotherapy;Moist Heat;Balance training;Therapeutic exercise;Therapeutic activities;Functional mobility training;Stair training;Gait training;Ultrasound;Neuromuscular re-education;Patient/family education;Manual techniques;Vasopneumatic Device;Taping;Energy conservation;Dry needling;Passive range of motion;Scar mobilization    PT Next Visit Plan  30-day hold    Consulted and Agree with Plan of Care  Patient       Patient will benefit from skilled therapeutic intervention in order to improve the following deficits and impairments:  Hypomobility, Increased edema, Decreased scar mobility, Decreased knowledge of precautions, Decreased activity tolerance, Decreased strength, Pain, Difficulty walking, Decreased balance, Decreased range of motion, Improper body mechanics, Postural dysfunction, Impaired flexibility  Visit Diagnosis: Acute pain of left knee  Stiffness of left knee, not elsewhere classified  Muscle weakness (generalized)  Difficulty in walking, not elsewhere classified     Problem List Patient Active Problem List   Diagnosis Date Noted  . Status post total left knee replacement 01/29/2019  . Primary osteoarthritis of left knee 01/02/2019  .  IDA (iron deficiency anemia) 03/18/2016  . 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 Chronic diarrhea 09/11/2015  . Family history of colon cancer 09/11/2015  . History of colon polyps 09/11/2015  . Status post left partial knee replacement 08/05/2014  . Other iron deficiency anemias 02/05/2013  . Gastric bypass status for obesity 02/05/2013    Bess Harvest, PTA 05/30/19 5:39 PM   [facility] [address]  [place], [place], [address] Phone: [phone]   Fax:  [phone]  Name: JARYN HOCUTT MRN: [id number] Date of Birth: September 20, 1975  PHYSICAL THERAPY DISCHARGE SUMMARY  Visits from Start of Care: 27  Current functional level related to goals /  functional outcomes: See above clinical impression   Remaining deficits: Difficulty with stair climbing, gait deviations   Education / Equipment: HEP  Plan: Patient agrees to discharge.  Patient goals were partially met. Patient is being discharged due to being pleased with the current functional level.  ?????     Janene Harvey, PT, DPT 07/05/19 3:55 PM

## 2019-08-27 ENCOUNTER — Inpatient Hospital Stay (HOSPITAL_BASED_OUTPATIENT_CLINIC_OR_DEPARTMENT_OTHER): Payer: Managed Care, Other (non HMO) | Admitting: Family

## 2019-08-27 ENCOUNTER — Encounter: Payer: Self-pay | Admitting: Family

## 2019-08-27 ENCOUNTER — Other Ambulatory Visit: Payer: Self-pay

## 2019-08-27 ENCOUNTER — Inpatient Hospital Stay: Payer: Managed Care, Other (non HMO) | Attending: Family

## 2019-08-27 VITALS — BP 138/93 | HR 78 | Temp 96.9°F | Resp 18 | Ht 68.0 in | Wt 212.1 lb

## 2019-08-27 DIAGNOSIS — R5383 Other fatigue: Secondary | ICD-10-CM | POA: Diagnosis not present

## 2019-08-27 DIAGNOSIS — K909 Intestinal malabsorption, unspecified: Secondary | ICD-10-CM | POA: Insufficient documentation

## 2019-08-27 DIAGNOSIS — D509 Iron deficiency anemia, unspecified: Secondary | ICD-10-CM | POA: Diagnosis present

## 2019-08-27 DIAGNOSIS — D508 Other iron deficiency anemias: Secondary | ICD-10-CM | POA: Diagnosis not present

## 2019-08-27 DIAGNOSIS — D510 Vitamin B12 deficiency anemia due to intrinsic factor deficiency: Secondary | ICD-10-CM | POA: Diagnosis not present

## 2019-08-27 DIAGNOSIS — Z9884 Bariatric surgery status: Secondary | ICD-10-CM | POA: Diagnosis not present

## 2019-08-27 LAB — CBC WITH DIFFERENTIAL (CANCER CENTER ONLY)
Abs Immature Granulocytes: 0.02 10*3/uL (ref 0.00–0.07)
Basophils Absolute: 0 10*3/uL (ref 0.0–0.1)
Basophils Relative: 1 %
Eosinophils Absolute: 0.1 10*3/uL (ref 0.0–0.5)
Eosinophils Relative: 1 %
HCT: 38.4 % (ref 36.0–46.0)
Hemoglobin: 12.2 g/dL (ref 12.0–15.0)
Immature Granulocytes: 1 %
Lymphocytes Relative: 30 %
Lymphs Abs: 1.3 10*3/uL (ref 0.7–4.0)
MCH: 26 pg (ref 26.0–34.0)
MCHC: 31.8 g/dL (ref 30.0–36.0)
MCV: 81.9 fL (ref 80.0–100.0)
Monocytes Absolute: 0.3 10*3/uL (ref 0.1–1.0)
Monocytes Relative: 8 %
Neutro Abs: 2.6 10*3/uL (ref 1.7–7.7)
Neutrophils Relative %: 59 %
Platelet Count: 343 10*3/uL (ref 150–400)
RBC: 4.69 MIL/uL (ref 3.87–5.11)
RDW: 13.3 % (ref 11.5–15.5)
WBC Count: 4.3 10*3/uL (ref 4.0–10.5)
nRBC: 0 % (ref 0.0–0.2)

## 2019-08-27 LAB — RETICULOCYTES
Immature Retic Fract: 8.7 % (ref 2.3–15.9)
RBC.: 4.64 MIL/uL (ref 3.87–5.11)
Retic Count, Absolute: 39.4 10*3/uL (ref 19.0–186.0)
Retic Ct Pct: 0.9 % (ref 0.4–3.1)

## 2019-08-27 NOTE — Progress Notes (Signed)
Hematology and Oncology Follow Up Visit  Brandi Lutz [id number] 28-Apr-1976 44 y.o. 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 [name] doing well but has noted some fatigue and ice cravings. She has not gotten into the mint candy yet which is usually what she does when iron deficient.  She has not noted any episodes of bleeding.  No fever, chills, n/v, cough, rash, dizziness, SOB, chest pain, palpitations, abdominal pain or changes in bowel or bladder habits.  No swelling, tenderness, numbness or tingling in her extremities.  No falls or syncope.  She has maintained a good appetite and is staying well hydrated.  [name] walking and using her total home gym regularly for exercise. [name] excited that her weight is down 13 lbs since her last visit.    ECOG Performance Status: 1 - Symptomatic but completely ambulatory  Medications:  Allergies as of 08/27/2019      Reactions   Tape Rash      Medication List       Accurate as of August 27, 2019  1:27 PM. If you have any questions, ask your nurse or doctor.        aspirin EC 81 MG tablet Take 1 tablet (81 mg total) by mouth 2 (two) times daily.   buPROPion 150 MG 24 hr tablet Commonly known as: WELLBUTRIN XL Take 150 mg by mouth daily.   diphenhydrAMINE 25 MG tablet Commonly known as: SOMINEX Take 25 mg by mouth at bedtime as needed for sleep.   letrozole 2.5 MG tablet Commonly known as: FEMARA Take 2.5 mg by mouth every morning.   multivitamin with minerals Tabs tablet Take 2 tablets by mouth daily.   phentermine 37.5 MG tablet Commonly known as: ADIPEX-P Take 37.5 mg by mouth daily.   sertraline 50 MG tablet Commonly known as: ZOLOFT Take 50 mg by mouth daily.   traMADol 50 MG  tablet Commonly known as: ULTRAM Take 1-2 tabs po bid prn pain       Allergies:  Allergies  Allergen Reactions  . Tape Rash    Past Medical History, Surgical history, Social history, and Family History were reviewed and updated.  Review of Systems: All other 10 point review of systems is negative.   Physical Exam:  vitals were not taken for this visit.   Wt Readings from Last 3 Encounters:  05/29/19 225 lb 1.6 oz (102.1 kg)  02/27/19 238 lb (108 kg)  01/29/19 230 lb 3.2 oz (104.4 kg)    Ocular: Sclerae unicteric, pupils equal, round and reactive to light Ear-nose-throat: Oropharynx clear, dentition fair Lymphatic: No cervical or supraclavicular adenopathy Lungs no rales or rhonchi, good excursion bilaterally Heart regular rate and rhythm, no murmur appreciated Abd soft, nontender, positive bowel sounds, no liver or spleen tip palpated on exam, no fluid wave  MSK no focal spinal tenderness, no joint edema Neuro: non-focal, well-oriented, appropriate affect Breasts: Deferred   Lab Results  Component Value Date   WBC 3.0 (L) 05/29/2019   HGB 12.7 05/29/2019   HCT 39.0 05/29/2019   MCV 81.3 05/29/2019   PLT 307 05/29/2019   Lab Results  Component Value Date   FERRITIN 19 05/29/2019   IRON 108 05/29/2019   TIBC 356 05/29/2019  UIBC 248 05/29/2019   IRONPCTSAT 30 05/29/2019   Lab Results  Component Value Date   RETICCTPCT 0.7 05/29/2019   RBC 4.80 05/29/2019   RBC 4.82 05/29/2019   RETICCTABS 31.3 05/02/2015   No results found for: KPAFRELGTCHN, LAMBDASER, KAPLAMBRATIO No results found for: IGGSERUM, IGA, IGMSERUM No results found for: Ronnald Ramp, A1GS, A2GS, Tillman Sers, SPEI   Chemistry      Component Value Date/Time   NA 140 01/30/2019 0310   K 4.1 01/30/2019 0310   CL 107 01/30/2019 0310   CO2 28 01/30/2019 0310   BUN 7 01/30/2019 0310   CREATININE 0.90 01/30/2019 0310      Component Value Date/Time   CALCIUM 8.2  (L) 01/30/2019 0310   ALKPHOS 69 01/26/2019 0904   AST 22 01/26/2019 0904   ALT 24 01/26/2019 0904   BILITOT 0.5 01/26/2019 0904       Impression and Plan: Brandi Lutz is a very pleasant 44 yo African American female withiron deficiency anemia secondary to malabsorption after gastric bypass. We will see what her iron studies show and replace if needed.  We will see her again in 3 months.  She will contact our office with any questions or concerns. We can certainly see her sooner if needed.   Laverna Peace, NP 3/15/20211:27 PM

## 2019-08-28 LAB — IRON AND TIBC
Iron: 82 ug/dL (ref 41–142)
Saturation Ratios: 21 % (ref 21–57)
TIBC: 394 ug/dL (ref 236–444)
UIBC: 312 ug/dL (ref 120–384)

## 2019-08-28 LAB — FERRITIN: Ferritin: 13 ng/mL (ref 11–307)

## 2019-11-13 ENCOUNTER — Telehealth: Payer: Self-pay | Admitting: Hematology & Oncology

## 2019-11-13 NOTE — Telephone Encounter (Signed)
Called and spoke with patient regarding added appointment for Lab Only this week per 6/1 staff message.  She was ok with both date/time

## 2019-11-16 ENCOUNTER — Inpatient Hospital Stay: Payer: Managed Care, Other (non HMO) | Attending: Family

## 2019-11-16 ENCOUNTER — Other Ambulatory Visit: Payer: Self-pay | Admitting: Family

## 2019-11-16 ENCOUNTER — Other Ambulatory Visit: Payer: Self-pay

## 2019-11-16 DIAGNOSIS — Z79899 Other long term (current) drug therapy: Secondary | ICD-10-CM | POA: Diagnosis not present

## 2019-11-16 DIAGNOSIS — Z9884 Bariatric surgery status: Secondary | ICD-10-CM | POA: Diagnosis not present

## 2019-11-16 DIAGNOSIS — K909 Intestinal malabsorption, unspecified: Secondary | ICD-10-CM

## 2019-11-16 DIAGNOSIS — D509 Iron deficiency anemia, unspecified: Secondary | ICD-10-CM | POA: Diagnosis not present

## 2019-11-16 DIAGNOSIS — D508 Other iron deficiency anemias: Secondary | ICD-10-CM

## 2019-11-16 LAB — CBC WITH DIFFERENTIAL (CANCER CENTER ONLY)
Abs Immature Granulocytes: 0.06 10*3/uL (ref 0.00–0.07)
Basophils Absolute: 0 10*3/uL (ref 0.0–0.1)
Basophils Relative: 1 %
Eosinophils Absolute: 0 10*3/uL (ref 0.0–0.5)
Eosinophils Relative: 1 %
HCT: 34.4 % — ABNORMAL LOW (ref 36.0–46.0)
Hemoglobin: 10.8 g/dL — ABNORMAL LOW (ref 12.0–15.0)
Immature Granulocytes: 1 %
Lymphocytes Relative: 27 %
Lymphs Abs: 1.2 10*3/uL (ref 0.7–4.0)
MCH: 24.2 pg — ABNORMAL LOW (ref 26.0–34.0)
MCHC: 31.4 g/dL (ref 30.0–36.0)
MCV: 77.1 fL — ABNORMAL LOW (ref 80.0–100.0)
Monocytes Absolute: 0.4 10*3/uL (ref 0.1–1.0)
Monocytes Relative: 10 %
Neutro Abs: 2.8 10*3/uL (ref 1.7–7.7)
Neutrophils Relative %: 60 %
Platelet Count: 322 10*3/uL (ref 150–400)
RBC: 4.46 MIL/uL (ref 3.87–5.11)
RDW: 13.8 % (ref 11.5–15.5)
WBC Count: 4.5 10*3/uL (ref 4.0–10.5)
nRBC: 0 % (ref 0.0–0.2)

## 2019-11-16 LAB — IRON AND TIBC
Iron: 29 ug/dL — ABNORMAL LOW (ref 41–142)
Saturation Ratios: 7 % — ABNORMAL LOW (ref 21–57)
TIBC: 434 ug/dL (ref 236–444)
UIBC: 405 ug/dL — ABNORMAL HIGH (ref 120–384)

## 2019-11-16 LAB — RETICULOCYTES
Immature Retic Fract: 12.9 % (ref 2.3–15.9)
RBC.: 4.36 MIL/uL (ref 3.87–5.11)
Retic Count, Absolute: 44 10*3/uL (ref 19.0–186.0)
Retic Ct Pct: 1 % (ref 0.4–3.1)

## 2019-11-16 LAB — FERRITIN: Ferritin: 6 ng/mL — ABNORMAL LOW (ref 11–307)

## 2019-11-20 ENCOUNTER — Other Ambulatory Visit: Payer: Self-pay | Admitting: Family

## 2019-11-26 ENCOUNTER — Inpatient Hospital Stay (HOSPITAL_BASED_OUTPATIENT_CLINIC_OR_DEPARTMENT_OTHER): Payer: Managed Care, Other (non HMO) | Admitting: Hematology & Oncology

## 2019-11-26 ENCOUNTER — Inpatient Hospital Stay: Payer: Managed Care, Other (non HMO)

## 2019-11-26 ENCOUNTER — Other Ambulatory Visit: Payer: Self-pay

## 2019-11-26 ENCOUNTER — Encounter: Payer: Self-pay | Admitting: Hematology & Oncology

## 2019-11-26 ENCOUNTER — Other Ambulatory Visit: Payer: Managed Care, Other (non HMO)

## 2019-11-26 VITALS — BP 142/89 | HR 96 | Temp 97.1°F | Resp 17 | Wt 216.0 lb

## 2019-11-26 VITALS — BP 97/73 | HR 82 | Resp 17

## 2019-11-26 DIAGNOSIS — D510 Vitamin B12 deficiency anemia due to intrinsic factor deficiency: Secondary | ICD-10-CM | POA: Diagnosis not present

## 2019-11-26 DIAGNOSIS — D508 Other iron deficiency anemias: Secondary | ICD-10-CM

## 2019-11-26 DIAGNOSIS — Z9884 Bariatric surgery status: Secondary | ICD-10-CM

## 2019-11-26 DIAGNOSIS — D509 Iron deficiency anemia, unspecified: Secondary | ICD-10-CM | POA: Diagnosis not present

## 2019-11-26 DIAGNOSIS — K909 Intestinal malabsorption, unspecified: Secondary | ICD-10-CM

## 2019-11-26 MED ORDER — SODIUM CHLORIDE 0.9 % IV SOLN
200.0000 mg | Freq: Once | INTRAVENOUS | Status: AC
  Administered 2019-11-26: 200 mg via INTRAVENOUS
  Filled 2019-11-26: qty 200

## 2019-11-26 MED ORDER — SODIUM CHLORIDE 0.9 % IV SOLN
Freq: Once | INTRAVENOUS | Status: AC
  Filled 2019-11-26: qty 250

## 2019-11-26 NOTE — Progress Notes (Signed)
Hematology and Oncology Follow Up Visit  Brandi Lutz [id number] April 18, 1976 44 y.o. 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  She still having problems with malabsorption.  She had her iron checked about 2 weeks ago.  Her ferritin was only 6 with an iron saturation of 7%.  [name] chewing a lot of ice.  When she gets iron deficient, she takes mints.  [name] still working but does not have as much energy.  She does not have any bleeding.  She does has issues with malabsorption from her gastric bypass.  There is no fever.  [name] going on a cruise in December.  Hopefully, by then we will be able to improve her iron status.  Overall, her performance status is ECOG 1.    Medications:  Allergies as of 11/26/2019      Reactions   Tape Rash      Medication List       Accurate as of November 26, 2019 12:46 PM. If you have any questions, ask your nurse or doctor.        buPROPion 150 MG 24 hr tablet Commonly known as: WELLBUTRIN XL Take 150 mg by mouth daily.   phentermine 37.5 MG tablet Commonly known as: ADIPEX-P Take 37.5 mg by mouth daily.   sertraline 50 MG tablet Commonly known as: ZOLOFT Take 50 mg by mouth daily.       Allergies:  Allergies  Allergen Reactions  . Tape Rash    Past Medical History, Surgical history, Social history, and Family History were reviewed and updated.  Review of Systems: Review of Systems  HENT: Negative.   Eyes: Negative.   Respiratory: Positive for shortness of breath.   Cardiovascular: Negative.   Gastrointestinal: Negative.   Genitourinary: Negative.   Musculoskeletal: Negative.   Skin: Negative.   Neurological: Negative.   Endo/Heme/Allergies: Negative.   Psychiatric/Behavioral:  Negative.      Physical Exam:  weight is 216 lb (98 kg). Her temporal temperature is 97.1 F (36.2 C) (abnormal). Her blood pressure is 142/89 (abnormal) and her pulse is 96. Her respiration is 17 and oxygen saturation is 100%.   Wt Readings from Last 3 Encounters:  11/26/19 216 lb (98 kg)  08/27/19 212 lb 1.9 oz (96.2 kg)  05/29/19 225 lb 1.6 oz (102.1 kg)    Physical Exam Vitals reviewed.  HENT:     Head: Normocephalic and atraumatic.  Eyes:     Pupils: Pupils are equal, round, and reactive to light.  Cardiovascular:     Rate and Rhythm: Normal rate and regular rhythm.     Heart sounds: Normal heart sounds.  Pulmonary:     Effort: Pulmonary effort is normal.     Breath sounds: Normal breath sounds.  Abdominal:     General: Bowel sounds are normal.     Palpations: Abdomen is soft.  Musculoskeletal:        General: No tenderness or deformity. Normal range of motion.     Cervical back: Normal range of motion.  Lymphadenopathy:     Cervical: No cervical adenopathy.  Skin:    General: Skin is warm and dry.     Findings: No erythema or rash.  Neurological:     Mental Status:  [name] alert and oriented to person, place, and time.  Psychiatric:        Behavior: Behavior normal.        Thought Content: Thought content normal.        Judgment: Judgment normal.      Lab Results  Component Value Date   WBC 4.5 11/16/2019   HGB 10.8 (L) 11/16/2019   HCT 34.4 (L) 11/16/2019   MCV 77.1 (L) 11/16/2019   PLT 322 11/16/2019   Lab Results  Component Value Date   FERRITIN 6 (L) 11/16/2019   IRON 29 (L) 11/16/2019   TIBC 434 11/16/2019   UIBC 405 (H) 11/16/2019   IRONPCTSAT 7 (L) 11/16/2019   Lab Results  Component Value Date   RETICCTPCT 1.0 11/16/2019   RBC 4.36 11/16/2019   RETICCTABS 31.3 05/02/2015   No results found for: KPAFRELGTCHN, LAMBDASER, KAPLAMBRATIO No results found for: IGGSERUM, IGA, IGMSERUM No results found for: Ronnald Ramp, A1GS,  A2GS, Violet Baldy, MSPIKE, SPEI   Chemistry      Component Value Date/Time   NA 140 01/30/2019 0310   K 4.1 01/30/2019 0310   CL 107 01/30/2019 0310   CO2 28 01/30/2019 0310   BUN 7 01/30/2019 0310   CREATININE 0.90 01/30/2019 0310      Component Value Date/Time   CALCIUM 8.2 (L) 01/30/2019 0310   ALKPHOS 69 01/26/2019 0904   AST 22 01/26/2019 0904   ALT 24 01/26/2019 0904   BILITOT 0.5 01/26/2019 0904       Impression and Plan: Brandi Lutz is a very pleasant 44 yo African American female withiron deficiency anemia secondary to malabsorption after gastric bypass.  We now give her IV iron.  I think, unfortunately, we can only give her Venofer.  I am just sad about this.  [name] missing time at work because of having to be here weekly for the [place] for.  We will plan to get her back in 2 months.  Hopefully, by then, we should see her levels go up and she will feel better.   Volanda Napoleon, MD 6/14/202112:46 PM

## 2019-11-26 NOTE — Patient Instructions (Signed)

## 2019-11-29 ENCOUNTER — Inpatient Hospital Stay: Payer: Managed Care, Other (non HMO)

## 2019-11-29 ENCOUNTER — Other Ambulatory Visit: Payer: Self-pay

## 2019-11-29 VITALS — BP 128/78 | HR 92 | Temp 97.1°F | Resp 18

## 2019-11-29 DIAGNOSIS — Z9884 Bariatric surgery status: Secondary | ICD-10-CM

## 2019-11-29 DIAGNOSIS — K909 Intestinal malabsorption, unspecified: Secondary | ICD-10-CM

## 2019-11-29 DIAGNOSIS — D509 Iron deficiency anemia, unspecified: Secondary | ICD-10-CM | POA: Diagnosis not present

## 2019-11-29 DIAGNOSIS — D508 Other iron deficiency anemias: Secondary | ICD-10-CM

## 2019-11-29 MED ORDER — SODIUM CHLORIDE 0.9 % IV SOLN
200.0000 mg | Freq: Once | INTRAVENOUS | Status: AC
  Administered 2019-11-29: 200 mg via INTRAVENOUS
  Filled 2019-11-29: qty 200

## 2019-11-29 MED ORDER — SODIUM CHLORIDE 0.9 % IV SOLN
Freq: Once | INTRAVENOUS | Status: AC
  Filled 2019-11-29: qty 250

## 2019-11-29 NOTE — Patient Instructions (Signed)

## 2019-12-03 ENCOUNTER — Inpatient Hospital Stay: Payer: Managed Care, Other (non HMO)

## 2019-12-03 ENCOUNTER — Other Ambulatory Visit: Payer: Self-pay

## 2019-12-03 VITALS — BP 118/77 | HR 89 | Temp 97.5°F | Resp 18

## 2019-12-03 DIAGNOSIS — D509 Iron deficiency anemia, unspecified: Secondary | ICD-10-CM | POA: Diagnosis not present

## 2019-12-03 DIAGNOSIS — K909 Intestinal malabsorption, unspecified: Secondary | ICD-10-CM

## 2019-12-03 DIAGNOSIS — D508 Other iron deficiency anemias: Secondary | ICD-10-CM

## 2019-12-03 DIAGNOSIS — Z9884 Bariatric surgery status: Secondary | ICD-10-CM

## 2019-12-03 MED ORDER — SODIUM CHLORIDE 0.9 % IV SOLN
Freq: Once | INTRAVENOUS | Status: AC
  Filled 2019-12-03: qty 250

## 2019-12-03 MED ORDER — SODIUM CHLORIDE 0.9 % IV SOLN
200.0000 mg | Freq: Once | INTRAVENOUS | Status: AC
  Administered 2019-12-03: 200 mg via INTRAVENOUS
  Filled 2019-12-03: qty 200

## 2019-12-03 NOTE — Patient Instructions (Signed)

## 2019-12-06 ENCOUNTER — Inpatient Hospital Stay: Payer: Managed Care, Other (non HMO)

## 2019-12-06 ENCOUNTER — Other Ambulatory Visit: Payer: Self-pay

## 2019-12-06 VITALS — BP 139/77 | HR 85 | Temp 98.0°F | Resp 18

## 2019-12-06 DIAGNOSIS — D508 Other iron deficiency anemias: Secondary | ICD-10-CM

## 2019-12-06 DIAGNOSIS — K909 Intestinal malabsorption, unspecified: Secondary | ICD-10-CM

## 2019-12-06 DIAGNOSIS — D509 Iron deficiency anemia, unspecified: Secondary | ICD-10-CM | POA: Diagnosis not present

## 2019-12-06 DIAGNOSIS — Z9884 Bariatric surgery status: Secondary | ICD-10-CM

## 2019-12-06 MED ORDER — SODIUM CHLORIDE 0.9 % IV SOLN
200.0000 mg | Freq: Once | INTRAVENOUS | Status: AC
  Administered 2019-12-06: 200 mg via INTRAVENOUS
  Filled 2019-12-06: qty 200

## 2019-12-06 MED ORDER — SODIUM CHLORIDE 0.9 % IV SOLN
Freq: Once | INTRAVENOUS | Status: AC
  Filled 2019-12-06: qty 250

## 2019-12-06 NOTE — Patient Instructions (Signed)

## 2019-12-10 ENCOUNTER — Other Ambulatory Visit: Payer: Self-pay

## 2019-12-10 ENCOUNTER — Inpatient Hospital Stay: Payer: Managed Care, Other (non HMO)

## 2019-12-10 VITALS — BP 125/73 | HR 85 | Temp 97.7°F | Resp 18

## 2019-12-10 DIAGNOSIS — D508 Other iron deficiency anemias: Secondary | ICD-10-CM

## 2019-12-10 DIAGNOSIS — Z9884 Bariatric surgery status: Secondary | ICD-10-CM

## 2019-12-10 DIAGNOSIS — K909 Intestinal malabsorption, unspecified: Secondary | ICD-10-CM

## 2019-12-10 DIAGNOSIS — D509 Iron deficiency anemia, unspecified: Secondary | ICD-10-CM | POA: Diagnosis not present

## 2019-12-10 MED ORDER — SODIUM CHLORIDE 0.9 % IV SOLN
Freq: Once | INTRAVENOUS | Status: AC
  Filled 2019-12-10: qty 250

## 2019-12-10 MED ORDER — SODIUM CHLORIDE 0.9 % IV SOLN
200.0000 mg | Freq: Once | INTRAVENOUS | Status: AC
  Administered 2019-12-10: 200 mg via INTRAVENOUS
  Filled 2019-12-10: qty 200

## 2019-12-10 NOTE — Patient Instructions (Signed)

## 2020-01-28 ENCOUNTER — Inpatient Hospital Stay: Payer: Managed Care, Other (non HMO) | Admitting: Hematology & Oncology

## 2020-01-28 ENCOUNTER — Inpatient Hospital Stay: Payer: Managed Care, Other (non HMO)

## 2020-02-13 ENCOUNTER — Inpatient Hospital Stay: Payer: Managed Care, Other (non HMO)

## 2020-02-13 ENCOUNTER — Inpatient Hospital Stay: Payer: Managed Care, Other (non HMO) | Admitting: Hematology & Oncology

## 2020-03-14 ENCOUNTER — Inpatient Hospital Stay (HOSPITAL_BASED_OUTPATIENT_CLINIC_OR_DEPARTMENT_OTHER): Payer: Managed Care, Other (non HMO) | Admitting: Hematology & Oncology

## 2020-03-14 ENCOUNTER — Encounter: Payer: Self-pay | Admitting: Hematology & Oncology

## 2020-03-14 ENCOUNTER — Inpatient Hospital Stay: Payer: Managed Care, Other (non HMO) | Attending: Family

## 2020-03-14 ENCOUNTER — Other Ambulatory Visit: Payer: Self-pay

## 2020-03-14 VITALS — BP 139/74 | HR 101 | Temp 97.9°F | Resp 18 | Wt 216.0 lb

## 2020-03-14 DIAGNOSIS — D508 Other iron deficiency anemias: Secondary | ICD-10-CM

## 2020-03-14 DIAGNOSIS — Z9884 Bariatric surgery status: Secondary | ICD-10-CM

## 2020-03-14 DIAGNOSIS — D509 Iron deficiency anemia, unspecified: Secondary | ICD-10-CM | POA: Insufficient documentation

## 2020-03-14 DIAGNOSIS — D510 Vitamin B12 deficiency anemia due to intrinsic factor deficiency: Secondary | ICD-10-CM

## 2020-03-14 DIAGNOSIS — K909 Intestinal malabsorption, unspecified: Secondary | ICD-10-CM | POA: Insufficient documentation

## 2020-03-14 LAB — CBC WITH DIFFERENTIAL (CANCER CENTER ONLY)
Abs Immature Granulocytes: 0.04 10*3/uL (ref 0.00–0.07)
Basophils Absolute: 0 10*3/uL (ref 0.0–0.1)
Basophils Relative: 1 %
Eosinophils Absolute: 0.1 10*3/uL (ref 0.0–0.5)
Eosinophils Relative: 1 %
HCT: 39.7 % (ref 36.0–46.0)
Hemoglobin: 12.8 g/dL (ref 12.0–15.0)
Immature Granulocytes: 1 %
Lymphocytes Relative: 38 %
Lymphs Abs: 1.6 10*3/uL (ref 0.7–4.0)
MCH: 26.2 pg (ref 26.0–34.0)
MCHC: 32.2 g/dL (ref 30.0–36.0)
MCV: 81.4 fL (ref 80.0–100.0)
Monocytes Absolute: 0.3 10*3/uL (ref 0.1–1.0)
Monocytes Relative: 7 %
Neutro Abs: 2.3 10*3/uL (ref 1.7–7.7)
Neutrophils Relative %: 52 %
Platelet Count: 241 10*3/uL (ref 150–400)
RBC: 4.88 MIL/uL (ref 3.87–5.11)
RDW: 14.6 % (ref 11.5–15.5)
WBC Count: 4.3 10*3/uL (ref 4.0–10.5)
nRBC: 0 % (ref 0.0–0.2)

## 2020-03-14 LAB — RETICULOCYTES
Immature Retic Fract: 5.3 % (ref 2.3–15.9)
RBC.: 4.88 MIL/uL (ref 3.87–5.11)
Retic Count, Absolute: 53.2 10*3/uL (ref 19.0–186.0)
Retic Ct Pct: 1.1 % (ref 0.4–3.1)

## 2020-03-14 LAB — CMP (CANCER CENTER ONLY)
ALT: 25 U/L (ref 0–44)
AST: 18 U/L (ref 15–41)
Albumin: 4 g/dL (ref 3.5–5.0)
Alkaline Phosphatase: 76 U/L (ref 38–126)
Anion gap: 5 (ref 5–15)
BUN: 12 mg/dL (ref 6–20)
CO2: 29 mmol/L (ref 22–32)
Calcium: 9.3 mg/dL (ref 8.9–10.3)
Chloride: 104 mmol/L (ref 98–111)
Creatinine: 0.92 mg/dL (ref 0.44–1.00)
GFR, Est AFR Am: >60 mL/min (ref >60)
GFR, Estimated: >60 mL/min (ref >60)
Glucose, Bld: 162 mg/dL — ABNORMAL HIGH (ref 70–99)
Potassium: 3.5 mmol/L (ref 3.5–5.1)
Sodium: 138 mmol/L (ref 135–145)
Total Bilirubin: 0.3 mg/dL (ref 0.3–1.2)
Total Protein: 6.3 g/dL — ABNORMAL LOW (ref 6.5–8.1)

## 2020-03-14 LAB — VITAMIN B12: Vitamin B-12: 130 pg/mL — ABNORMAL LOW (ref 180–914)

## 2020-03-14 NOTE — Progress Notes (Signed)
Hematology and Oncology Follow Up Visit  Brandi Lutz [id number] 12-Jul-1975 44 y.o. 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  Brandi Lutz has Brandi Lutz is doing quite nicely.  Brandi Lutz did receive iron back in June.  Brandi Lutz had 5 doses of Venofer.  Brandi Lutz really feels better now.  Brandi Lutz is under a little bit of stress right now.  Brandi Lutz and Brandi Lutz mother, who is living with Brandi Lutz, are try to move to a new house.  The new house is not built yet.  Brandi Lutz mother is going to live with a sister right now.  I am not sure Brandi Lutz is still plan on going on a cruise in December.  Hopefully Brandi Lutz will.  Brandi Lutz is working from home.  Brandi Lutz enjoys this.  There is been no problems with bleeding.  Brandi Lutz has had no change in bowel or bladder habits.  Brandi Lutz has had no nausea or vomiting.  Brandi Lutz has had no rashes.  There is been no leg swelling.  Brandi Lutz has had Brandi Lutz Covid vaccine.  Overall, Brandi Lutz performance status is ECOG 1.    Medications:  Allergies as of 03/14/2020      Reactions   Tape Rash      Medication List       Accurate as of March 14, 2020  2:43 PM. If you have any questions, ask your nurse or doctor.        buPROPion 150 MG 24 hr tablet Commonly known as: WELLBUTRIN XL Take 150 mg by mouth daily.   phentermine 37.5 MG tablet Commonly known as: ADIPEX-P Take 37.5 mg by mouth daily.   sertraline 50 MG tablet Commonly known as: ZOLOFT Take 50 mg by mouth daily.       Allergies:  Allergies  Allergen Reactions  . Tape Rash    Past Medical History, Surgical history, Social history, and Family History were reviewed and updated.  Review of Systems: Review of Systems  HENT: Negative.   Eyes: Negative.   Respiratory: Positive for shortness of breath.     Cardiovascular: Negative.   Gastrointestinal: Negative.   Genitourinary: Negative.   Musculoskeletal: Negative.   Skin: Negative.   Neurological: Negative.   Endo/Heme/Allergies: Negative.   Psychiatric/Behavioral: Negative.      Physical Exam:  weight is 216 lb (98 kg). Brandi Lutz oral temperature is 97.9 F (36.6 C). Brandi Lutz blood pressure is 139/74 and Brandi Lutz pulse is 101 (abnormal). Brandi Lutz respiration is 18 and oxygen saturation is 100%.   Wt Readings from Last 3 Encounters:  03/14/20 216 lb (98 kg)  11/26/19 216 lb (98 kg)  08/27/19 212 lb 1.9 oz (96.2 kg)    Physical Exam Vitals reviewed.  HENT:     Head: Normocephalic and atraumatic.  Eyes:     Pupils: Pupils are equal, round, and reactive to light.  Cardiovascular:     Rate and Rhythm: Normal rate and regular rhythm.     Heart sounds: Normal heart sounds.  Pulmonary:     Effort: Pulmonary effort is normal.     Breath sounds: Normal breath sounds.  Abdominal:     General: Bowel sounds are normal.     Palpations: Abdomen is soft.  Musculoskeletal:        General:  No tenderness or deformity. Normal range of motion.     Cervical back: Normal range of motion.  Lymphadenopathy:     Cervical: No cervical adenopathy.  Skin:    General: Skin is warm and dry.     Findings: No erythema or rash.  Neurological:     Mental Status: Brandi Lutz is alert and oriented to person, place, and time.  Psychiatric:        Behavior: Behavior normal.        Thought Content: Thought content normal.        Judgment: Judgment normal.      Lab Results  Component Value Date   WBC 4.3 03/14/2020   HGB 12.8 03/14/2020   HCT 39.7 03/14/2020   MCV 81.4 03/14/2020   PLT 241 03/14/2020   Lab Results  Component Value Date   FERRITIN 6 (L) 11/16/2019   IRON 29 (L) 11/16/2019   TIBC 434 11/16/2019   UIBC 405 (H) 11/16/2019   IRONPCTSAT 7 (L) 11/16/2019   Lab Results  Component Value Date   RETICCTPCT 1.1 03/14/2020   RBC 4.88 03/14/2020   RBC 4.88  03/14/2020   RETICCTABS 31.3 05/02/2015   No results found for: KPAFRELGTCHN, LAMBDASER, KAPLAMBRATIO No results found for: IGGSERUM, IGA, IGMSERUM No results found for: Ronnald Ramp, A1GS, A2GS, Violet Baldy, MSPIKE, SPEI   Chemistry      Component Value Date/Time   NA 140 01/30/2019 0310   K 4.1 01/30/2019 0310   CL 107 01/30/2019 0310   CO2 28 01/30/2019 0310   BUN 7 01/30/2019 0310   CREATININE 0.90 01/30/2019 0310      Component Value Date/Time   CALCIUM 8.2 (L) 01/30/2019 0310   ALKPHOS 69 01/26/2019 0904   AST 22 01/26/2019 0904   ALT 24 01/26/2019 0904   BILITOT 0.5 01/26/2019 0904       Impression and Plan: Brandi Lutz is a very pleasant 44 yo African American female withiron deficiency anemia secondary to malabsorption after gastric bypass.  I am glad to see that Brandi Lutz iron is better.  I am glad that Brandi Lutz hemoglobin is better.  Hopefully, we can try to get Brandi Lutz to or through the holidays.  Hopefully, Brandi Lutz will go on Brandi Lutz cruise in December.     Volanda Napoleon, MD 10/1/20212:43 PM

## 2020-03-17 LAB — IRON AND TIBC
Iron: 79 ug/dL (ref 41–142)
Saturation Ratios: 23 % (ref 21–57)
TIBC: 339 ug/dL (ref 236–444)
UIBC: 259 ug/dL (ref 120–384)

## 2020-03-17 LAB — FERRITIN: Ferritin: 21 ng/mL (ref 11–307)

## 2020-03-24 ENCOUNTER — Telehealth: Payer: Self-pay | Admitting: *Deleted

## 2020-03-24 NOTE — Telephone Encounter (Signed)
As noted below by Dr. Marin Olp, I informed the patient that her iron level was OK. She verbalized understanding.

## 2020-03-24 NOTE — Telephone Encounter (Signed)
-----   Message from Volanda Napoleon, MD sent at 03/24/2020  6:52 AM EDT ----- Call - the iron level is ok!!  Laurey Arrow

## 2020-06-20 ENCOUNTER — Inpatient Hospital Stay (HOSPITAL_BASED_OUTPATIENT_CLINIC_OR_DEPARTMENT_OTHER): Payer: Managed Care, Other (non HMO) | Admitting: Family

## 2020-06-20 ENCOUNTER — Other Ambulatory Visit: Payer: Self-pay

## 2020-06-20 ENCOUNTER — Inpatient Hospital Stay: Payer: Managed Care, Other (non HMO) | Attending: Family

## 2020-06-20 ENCOUNTER — Encounter: Payer: Self-pay | Admitting: Family

## 2020-06-20 VITALS — BP 134/78 | HR 88 | Temp 98.0°F | Resp 17 | Ht 68.0 in | Wt 213.4 lb

## 2020-06-20 DIAGNOSIS — K909 Intestinal malabsorption, unspecified: Secondary | ICD-10-CM

## 2020-06-20 DIAGNOSIS — Z9884 Bariatric surgery status: Secondary | ICD-10-CM | POA: Insufficient documentation

## 2020-06-20 DIAGNOSIS — R5383 Other fatigue: Secondary | ICD-10-CM | POA: Insufficient documentation

## 2020-06-20 DIAGNOSIS — D508 Other iron deficiency anemias: Secondary | ICD-10-CM | POA: Diagnosis not present

## 2020-06-20 DIAGNOSIS — R200 Anesthesia of skin: Secondary | ICD-10-CM | POA: Diagnosis not present

## 2020-06-20 DIAGNOSIS — Z79899 Other long term (current) drug therapy: Secondary | ICD-10-CM | POA: Diagnosis not present

## 2020-06-20 LAB — CMP (CANCER CENTER ONLY)
ALT: 21 U/L (ref 0–44)
AST: 24 U/L (ref 15–41)
Albumin: 4 g/dL (ref 3.5–5.0)
Alkaline Phosphatase: 75 U/L (ref 38–126)
Anion gap: 10 (ref 5–15)
BUN: 12 mg/dL (ref 6–20)
CO2: 24 mmol/L (ref 22–32)
Calcium: 8.7 mg/dL — ABNORMAL LOW (ref 8.9–10.3)
Chloride: 105 mmol/L (ref 98–111)
Creatinine: 0.9 mg/dL (ref 0.44–1.00)
GFR, Estimated: >60 mL/min (ref >60)
Glucose, Bld: 108 mg/dL — ABNORMAL HIGH (ref 70–99)
Potassium: 3.6 mmol/L (ref 3.5–5.1)
Sodium: 139 mmol/L (ref 135–145)
Total Bilirubin: 0.5 mg/dL (ref 0.3–1.2)
Total Protein: 6.4 g/dL — ABNORMAL LOW (ref 6.5–8.1)

## 2020-06-20 LAB — RETICULOCYTES
Immature Retic Fract: 5 % (ref 2.3–15.9)
RBC.: 4.74 MIL/uL (ref 3.87–5.11)
Retic Count, Absolute: 48.8 10*3/uL (ref 19.0–186.0)
Retic Ct Pct: 1 % (ref 0.4–3.1)

## 2020-06-20 LAB — CBC WITH DIFFERENTIAL (CANCER CENTER ONLY)
Abs Immature Granulocytes: 0 10*3/uL (ref 0.00–0.07)
Basophils Absolute: 0 10*3/uL (ref 0.0–0.1)
Basophils Relative: 1 %
Eosinophils Absolute: 0.1 10*3/uL (ref 0.0–0.5)
Eosinophils Relative: 2 %
HCT: 39.1 % (ref 36.0–46.0)
Hemoglobin: 12.8 g/dL (ref 12.0–15.0)
Immature Granulocytes: 0 %
Lymphocytes Relative: 38 %
Lymphs Abs: 1.5 10*3/uL (ref 0.7–4.0)
MCH: 27.1 pg (ref 26.0–34.0)
MCHC: 32.7 g/dL (ref 30.0–36.0)
MCV: 82.7 fL (ref 80.0–100.0)
Monocytes Absolute: 0.3 10*3/uL (ref 0.1–1.0)
Monocytes Relative: 9 %
Neutro Abs: 2 10*3/uL (ref 1.7–7.7)
Neutrophils Relative %: 50 %
Platelet Count: 271 10*3/uL (ref 150–400)
RBC: 4.73 MIL/uL (ref 3.87–5.11)
RDW: 13 % (ref 11.5–15.5)
WBC Count: 4 10*3/uL (ref 4.0–10.5)
nRBC: 0 % (ref 0.0–0.2)

## 2020-06-20 NOTE — Progress Notes (Signed)
Hematology and Oncology Follow Up Visit  Brandi Lutz [id number] October 10, 1975 45 y.o. 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 [name] symptomatic with fatigue and is under a good bit of stress at work and at home.  No blood loss noted. No abnormal bruising, no petechiae.  No fever, chills, n/v, cough, rash, dizziness, SOB, chest pain, palpitations, abdominal pain or changes in bowel or bladder habits.  No swelling in her extremities.  She has positional numbness and tingling in her fingertips when driving. This resolves once she relaxes her arms.  No falls or syncope.  She has maintained a good appetite and is staying well hydrated. Her weight is stable at 213 lbs.    ECOG Performance Status: 1 - Symptomatic but completely ambulatory  Medications:  Allergies as of 06/20/2020      Reactions   Tape Rash      Medication List       Accurate as of June 20, 2020  1:52 PM. If you have any questions, ask your nurse or doctor.        buPROPion 150 MG 24 hr tablet Commonly known as: WELLBUTRIN XL Take 150 mg by mouth daily.   phentermine 37.5 MG tablet Commonly known as: ADIPEX-P Take 37.5 mg by mouth daily.   sertraline 50 MG tablet Commonly known as: ZOLOFT Take 50 mg by mouth daily.       Allergies:  Allergies  Allergen Reactions  . Tape Rash    Past Medical History, Surgical history, Social history, and Family History were reviewed and updated.  Review of Systems: All other 10 point review of systems is negative.   Physical Exam:  height is 5\' 8"  (1.727 m) and weight is 213 lb 6.4 oz (96.8 kg). Her oral temperature is 98 F (36.7 C). Her blood pressure is 134/78 and her pulse is 88. Her respiration is 17 and oxygen  saturation is 100%.   Wt Readings from Last 3 Encounters:  06/20/20 213 lb 6.4 oz (96.8 kg)  03/14/20 216 lb (98 kg)  11/26/19 216 lb (98 kg)    Ocular: Sclerae unicteric, pupils equal, round and reactive to light Ear-nose-throat: Oropharynx clear, dentition fair Lymphatic: No cervical or supraclavicular adenopathy Lungs no rales or rhonchi, good excursion bilaterally Heart regular rate and rhythm, no murmur appreciated Abd soft, nontender, positive bowel sounds MSK no focal spinal tenderness, no joint edema Neuro: non-focal, well-oriented, appropriate affect Breasts: Deferred   Lab Results  Component Value Date   WBC 4.0 06/20/2020   HGB 12.8 06/20/2020   HCT 39.1 06/20/2020   MCV 82.7 06/20/2020   PLT 271 06/20/2020   Lab Results  Component Value Date   FERRITIN 21 03/14/2020   IRON 79 03/14/2020   TIBC 339 03/14/2020   UIBC 259 03/14/2020   IRONPCTSAT 23 03/14/2020   Lab Results  Component Value Date   RETICCTPCT 1.0 06/20/2020   RBC 4.73 06/20/2020   RBC 4.74 06/20/2020   RETICCTABS 31.3 05/02/2015   No results found for: KPAFRELGTCHN, LAMBDASER, KAPLAMBRATIO No results found for: IGGSERUM, IGA, IGMSERUM No results found for: TOTALPROTELP, ALBUMINELP, A1GS, A2GS, BETS, BETA2SER, GAMS, MSPIKE, SPEI   Chemistry      Component Value Date/Time  NA 138 03/14/2020 1404   K 3.5 03/14/2020 1404   CL 104 03/14/2020 1404   CO2 29 03/14/2020 1404   BUN 12 03/14/2020 1404   CREATININE 0.92 03/14/2020 1404      Component Value Date/Time   CALCIUM 9.3 03/14/2020 1404   ALKPHOS 76 03/14/2020 1404   AST 18 03/14/2020 1404   ALT 25 03/14/2020 1404   BILITOT 0.3 03/14/2020 1404       Impression and Plan: Brandi Lutz is a very pleasant 45 yo African American female withiron deficiency anemia secondary to malabsorption after gastric bypass. Iron studies are pending. We will replace if needed.  Follow-up in 3 months.  She can contact our office with any questions or  concerns.   Laverna Peace, NP 1/7/20221:52 PM

## 2020-06-23 LAB — IRON AND TIBC
Iron: 64 ug/dL (ref 41–142)
Saturation Ratios: 18 % — ABNORMAL LOW (ref 21–57)
TIBC: 361 ug/dL (ref 236–444)
UIBC: 297 ug/dL (ref 120–384)

## 2020-06-23 LAB — FERRITIN: Ferritin: 15 ng/mL (ref 11–307)

## 2020-06-24 ENCOUNTER — Telehealth: Payer: Self-pay | Admitting: *Deleted

## 2020-06-24 NOTE — Telephone Encounter (Signed)
-----   Message from Volanda Napoleon, MD sent at 06/24/2020  2:56 PM EST ----- Call - the iron is low.  She needs IV Iron.  Please set this up.  Laurey Arrow

## 2020-06-24 NOTE — Telephone Encounter (Signed)
lmovm for pt with results

## 2020-06-25 ENCOUNTER — Telehealth: Payer: Self-pay

## 2020-06-25 NOTE — Telephone Encounter (Signed)
Iv iron x3 appts made per inbasket message   aom

## 2020-07-01 ENCOUNTER — Other Ambulatory Visit: Payer: Self-pay

## 2020-07-01 ENCOUNTER — Inpatient Hospital Stay: Payer: Managed Care, Other (non HMO)

## 2020-07-01 VITALS — BP 123/72 | HR 87 | Temp 97.9°F | Resp 16

## 2020-07-01 DIAGNOSIS — K909 Intestinal malabsorption, unspecified: Secondary | ICD-10-CM

## 2020-07-01 DIAGNOSIS — D508 Other iron deficiency anemias: Secondary | ICD-10-CM | POA: Diagnosis not present

## 2020-07-01 DIAGNOSIS — Z9884 Bariatric surgery status: Secondary | ICD-10-CM

## 2020-07-01 MED ORDER — SODIUM CHLORIDE 0.9 % IV SOLN
Freq: Once | INTRAVENOUS | Status: AC
Start: 2020-07-01 — End: 2020-07-01
  Filled 2020-07-01: qty 250

## 2020-07-01 MED ORDER — SODIUM CHLORIDE 0.9 % IV SOLN
200.0000 mg | Freq: Once | INTRAVENOUS | Status: AC
  Administered 2020-07-01: 200 mg via INTRAVENOUS
  Filled 2020-07-01: qty 200

## 2020-07-01 NOTE — Patient Instructions (Signed)

## 2020-07-04 ENCOUNTER — Inpatient Hospital Stay: Payer: Managed Care, Other (non HMO)

## 2020-07-04 ENCOUNTER — Other Ambulatory Visit: Payer: Self-pay

## 2020-07-04 VITALS — BP 125/73 | HR 88 | Temp 97.6°F | Resp 17

## 2020-07-04 DIAGNOSIS — D508 Other iron deficiency anemias: Secondary | ICD-10-CM

## 2020-07-04 DIAGNOSIS — K909 Intestinal malabsorption, unspecified: Secondary | ICD-10-CM

## 2020-07-04 DIAGNOSIS — Z9884 Bariatric surgery status: Secondary | ICD-10-CM

## 2020-07-04 MED ORDER — SODIUM CHLORIDE 0.9 % IV SOLN
Freq: Once | INTRAVENOUS | Status: AC
  Filled 2020-07-04: qty 250

## 2020-07-04 MED ORDER — SODIUM CHLORIDE 0.9 % IV SOLN
200.0000 mg | Freq: Once | INTRAVENOUS | Status: AC
  Administered 2020-07-04: 200 mg via INTRAVENOUS
  Filled 2020-07-04: qty 200

## 2020-07-04 NOTE — Patient Instructions (Signed)

## 2020-07-04 NOTE — Progress Notes (Signed)
Pt declined to stay for post infusion observation period. Pt stated she has tolerated medication multiple times prior without difficulty. Pt aware to call clinic with any questions or concerns. Pt verbalized understanding and had no further questions.  ? ?

## 2020-07-08 ENCOUNTER — Inpatient Hospital Stay: Payer: Managed Care, Other (non HMO)

## 2020-07-08 ENCOUNTER — Other Ambulatory Visit: Payer: Self-pay

## 2020-07-08 VITALS — BP 123/84 | HR 94 | Temp 98.6°F | Resp 17

## 2020-07-08 DIAGNOSIS — K909 Intestinal malabsorption, unspecified: Secondary | ICD-10-CM

## 2020-07-08 DIAGNOSIS — D508 Other iron deficiency anemias: Secondary | ICD-10-CM | POA: Diagnosis not present

## 2020-07-08 DIAGNOSIS — Z9884 Bariatric surgery status: Secondary | ICD-10-CM

## 2020-07-08 MED ORDER — SODIUM CHLORIDE 0.9 % IV SOLN
200.0000 mg | Freq: Once | INTRAVENOUS | Status: AC
  Administered 2020-07-08: 200 mg via INTRAVENOUS
  Filled 2020-07-08: qty 200

## 2020-07-08 MED ORDER — SODIUM CHLORIDE 0.9 % IV SOLN
Freq: Once | INTRAVENOUS | Status: AC
  Filled 2020-07-08: qty 250

## 2020-07-08 NOTE — Progress Notes (Signed)
Pt. Refused to wait 30 minutes post infusion. Stable and ASX upon discharge.

## 2020-08-21 ENCOUNTER — Telehealth: Payer: Self-pay

## 2020-08-21 NOTE — Telephone Encounter (Signed)
Called and left a vm with updated appt as sarah will no longer be in the office on thurs.        anne

## 2020-08-29 ENCOUNTER — Telehealth: Payer: Self-pay | Admitting: Family

## 2020-08-29 NOTE — Telephone Encounter (Signed)
Due to provider being on PAL 4/8.  Appointments were rescheduled & updated calendar/letter was mailed  

## 2020-09-16 ENCOUNTER — Inpatient Hospital Stay: Payer: Managed Care, Other (non HMO) | Attending: Family

## 2020-09-16 ENCOUNTER — Encounter: Payer: Self-pay | Admitting: Family

## 2020-09-16 ENCOUNTER — Inpatient Hospital Stay (HOSPITAL_BASED_OUTPATIENT_CLINIC_OR_DEPARTMENT_OTHER): Payer: Managed Care, Other (non HMO) | Admitting: Family

## 2020-09-16 ENCOUNTER — Other Ambulatory Visit: Payer: Self-pay

## 2020-09-16 VITALS — BP 133/85 | HR 80 | Temp 98.0°F | Resp 18 | Ht 68.0 in | Wt 216.1 lb

## 2020-09-16 DIAGNOSIS — Z9884 Bariatric surgery status: Secondary | ICD-10-CM | POA: Insufficient documentation

## 2020-09-16 DIAGNOSIS — Z79899 Other long term (current) drug therapy: Secondary | ICD-10-CM

## 2020-09-16 DIAGNOSIS — K909 Intestinal malabsorption, unspecified: Secondary | ICD-10-CM

## 2020-09-16 DIAGNOSIS — D510 Vitamin B12 deficiency anemia due to intrinsic factor deficiency: Secondary | ICD-10-CM

## 2020-09-16 DIAGNOSIS — D508 Other iron deficiency anemias: Secondary | ICD-10-CM | POA: Insufficient documentation

## 2020-09-16 LAB — RETICULOCYTES
Immature Retic Fract: 6.9 % (ref 2.3–15.9)
RBC.: 4.85 MIL/uL (ref 3.87–5.11)
Retic Count, Absolute: 61.1 10*3/uL (ref 19.0–186.0)
Retic Ct Pct: 1.3 % (ref 0.4–3.1)

## 2020-09-16 LAB — CMP (CANCER CENTER ONLY)
ALT: 70 U/L — ABNORMAL HIGH (ref 0–44)
AST: 43 U/L — ABNORMAL HIGH (ref 15–41)
Albumin: 4 g/dL (ref 3.5–5.0)
Alkaline Phosphatase: 99 U/L (ref 38–126)
Anion gap: 5 (ref 5–15)
BUN: 12 mg/dL (ref 6–20)
CO2: 30 mmol/L (ref 22–32)
Calcium: 9 mg/dL (ref 8.9–10.3)
Chloride: 105 mmol/L (ref 98–111)
Creatinine: 0.93 mg/dL (ref 0.44–1.00)
GFR, Estimated: >60 mL/min (ref >60)
Glucose, Bld: 142 mg/dL — ABNORMAL HIGH (ref 70–99)
Potassium: 4.1 mmol/L (ref 3.5–5.1)
Sodium: 140 mmol/L (ref 135–145)
Total Bilirubin: 0.4 mg/dL (ref 0.3–1.2)
Total Protein: 6.2 g/dL — ABNORMAL LOW (ref 6.5–8.1)

## 2020-09-16 LAB — CBC WITH DIFFERENTIAL (CANCER CENTER ONLY)
Abs Immature Granulocytes: 0.01 10*3/uL (ref 0.00–0.07)
Basophils Absolute: 0 10*3/uL (ref 0.0–0.1)
Basophils Relative: 1 %
Eosinophils Absolute: 0.1 10*3/uL (ref 0.0–0.5)
Eosinophils Relative: 2 %
HCT: 40.2 % (ref 36.0–46.0)
Hemoglobin: 13.1 g/dL (ref 12.0–15.0)
Immature Granulocytes: 0 %
Lymphocytes Relative: 30 %
Lymphs Abs: 1.3 10*3/uL (ref 0.7–4.0)
MCH: 27.1 pg (ref 26.0–34.0)
MCHC: 32.6 g/dL (ref 30.0–36.0)
MCV: 83.2 fL (ref 80.0–100.0)
Monocytes Absolute: 0.3 10*3/uL (ref 0.1–1.0)
Monocytes Relative: 8 %
Neutro Abs: 2.6 10*3/uL (ref 1.7–7.7)
Neutrophils Relative %: 59 %
Platelet Count: 316 10*3/uL (ref 150–400)
RBC: 4.83 MIL/uL (ref 3.87–5.11)
RDW: 13.2 % (ref 11.5–15.5)
WBC Count: 4.4 10*3/uL (ref 4.0–10.5)
nRBC: 0 % (ref 0.0–0.2)

## 2020-09-16 NOTE — Progress Notes (Signed)
Hematology and Oncology Follow Up Visit  Brandi Lutz [id number] 1975-12-13 45 y.o. 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 [name] doing well. She had surgery last week on Thursday to repair her right pinky hammer toe. She has a support boot on the right foot at this time and follows up with her surgeon later this afternoon.  She denies any blood loss. No abnormal bruising, no petechiae She has occasional fatigue and will rest as needed.  No fever, chills, n/v, cough, rash, dizziness, SOB, chest pain, palpitations, abdominal pain or changes in bowel or bladder habits.  She has dumping syndrome with certain foods and avoids these triggers if possible.  No numbness or tingling in her extremities at this time.  She notes minimal swelling at the surgery site. No other swelling noted in her extremities.  No falls or syncope to report.  She has maintained a good appetite and is staying well hydrated. Her eight is stable at 216 lbs.   ECOG Performance Status: 1 - Symptomatic but completely ambulatory  Medications:  Allergies as of 09/16/2020      Reactions   Tape Rash      Medication List       Accurate as of September 16, 2020  2:24 PM. If you have any questions, ask your nurse or doctor.        buPROPion 150 MG 24 hr tablet Commonly known as: WELLBUTRIN XL Take 150 mg by mouth daily.   phentermine 37.5 MG tablet Commonly known as: ADIPEX-P Take 37.5 mg by mouth daily.   sertraline 50 MG tablet Commonly known as: ZOLOFT Take 50 mg by mouth daily.       Allergies:  Allergies  Allergen Reactions  . Tape Rash    Past Medical History, Surgical history, Social history, and Family History were reviewed and updated.  Review of  Systems: All other 10 point review of systems is negative.   Physical Exam:  height is 5\' 8"  (1.727 m) and weight is 216 lb 1.9 oz (98 kg). Her oral temperature is 98 F (36.7 C). Her blood pressure is 133/85 and her pulse is 80. Her respiration is 18 and oxygen saturation is 100%.   Wt Readings from Last 3 Encounters:  09/16/20 216 lb 1.9 oz (98 kg)  06/20/20 213 lb 6.4 oz (96.8 kg)  03/14/20 216 lb (98 kg)    Ocular: Sclerae unicteric, pupils equal, round and reactive to light Ear-nose-throat: Oropharynx clear, dentition fair Lymphatic: No cervical or supraclavicular adenopathy Lungs no rales or rhonchi, good excursion bilaterally Heart regular rate and rhythm, no murmur appreciated Abd soft, nontender, positive bowel sounds MSK no focal spinal tenderness, no joint edema Neuro: non-focal, well-oriented, appropriate affect Breasts: Deferred   Lab Results  Component Value Date   WBC 4.4 09/16/2020   HGB 13.1 09/16/2020   HCT 40.2 09/16/2020   MCV 83.2 09/16/2020   PLT 316 09/16/2020   Lab Results  Component Value Date   FERRITIN 15 06/20/2020   IRON 64 06/20/2020   TIBC 361 06/20/2020   UIBC 297 06/20/2020   IRONPCTSAT 18 (L) 06/20/2020   Lab Results  Component Value Date   RETICCTPCT 1.3 09/16/2020   RBC 4.85 09/16/2020   RETICCTABS 31.3 05/02/2015   No results  found for: KPAFRELGTCHN, LAMBDASER, KAPLAMBRATIO No results found for: IGGSERUM, IGA, IGMSERUM No results found for: Odetta Pink, SPEI   Chemistry      Component Value Date/Time   NA 139 06/20/2020 1315   K 3.6 06/20/2020 1315   CL 105 06/20/2020 1315   CO2 24 06/20/2020 1315   BUN 12 06/20/2020 1315   CREATININE 0.90 06/20/2020 1315      Component Value Date/Time   CALCIUM 8.7 (L) 06/20/2020 1315   ALKPHOS 75 06/20/2020 1315   AST 24 06/20/2020 1315   ALT 21 06/20/2020 1315   BILITOT 0.5 06/20/2020 1315       Impression and Plan: Ms.  Lutz is a very pleasant 45 yo African American female withiron deficiency anemia secondary to malabsorption after gastric bypass. Iron studies are pending. We will replace if needed.  Follow-up in 4 months.  She can contact our office with any questions or concerns.   Brandi Peace, NP 4/5/20222:24 PM

## 2020-09-17 ENCOUNTER — Telehealth: Payer: Self-pay | Admitting: Family

## 2020-09-17 LAB — IRON AND TIBC
Iron: 87 ug/dL (ref 41–142)
Saturation Ratios: 23 % (ref 21–57)
TIBC: 370 ug/dL (ref 236–444)
UIBC: 283 ug/dL (ref 120–384)

## 2020-09-17 LAB — FERRITIN: Ferritin: 45 ng/mL (ref 11–307)

## 2020-09-17 NOTE — Telephone Encounter (Signed)
Appointments scheduled calendar mailed per 4/5 los

## 2020-09-18 ENCOUNTER — Ambulatory Visit: Payer: Managed Care, Other (non HMO) | Admitting: Family

## 2020-09-18 ENCOUNTER — Other Ambulatory Visit: Payer: Managed Care, Other (non HMO)

## 2020-09-19 ENCOUNTER — Ambulatory Visit: Payer: Managed Care, Other (non HMO) | Admitting: Family

## 2020-09-19 ENCOUNTER — Other Ambulatory Visit: Payer: Managed Care, Other (non HMO)

## 2021-01-16 ENCOUNTER — Inpatient Hospital Stay: Payer: Managed Care, Other (non HMO) | Admitting: Family

## 2021-01-16 ENCOUNTER — Inpatient Hospital Stay: Payer: Managed Care, Other (non HMO) | Attending: Hematology & Oncology

## 2021-05-19 IMAGING — DX PORTABLE LEFT KNEE - 1-2 VIEW
2 series · 2 of 2 positions shown · non-contrast
Comparison: Plain films left knee 01/02/2019.

CLINICAL DATA: Status post left knee replacement today.

EXAM:
PORTABLE LEFT KNEE - 1-2 VIEW

[knee lat]
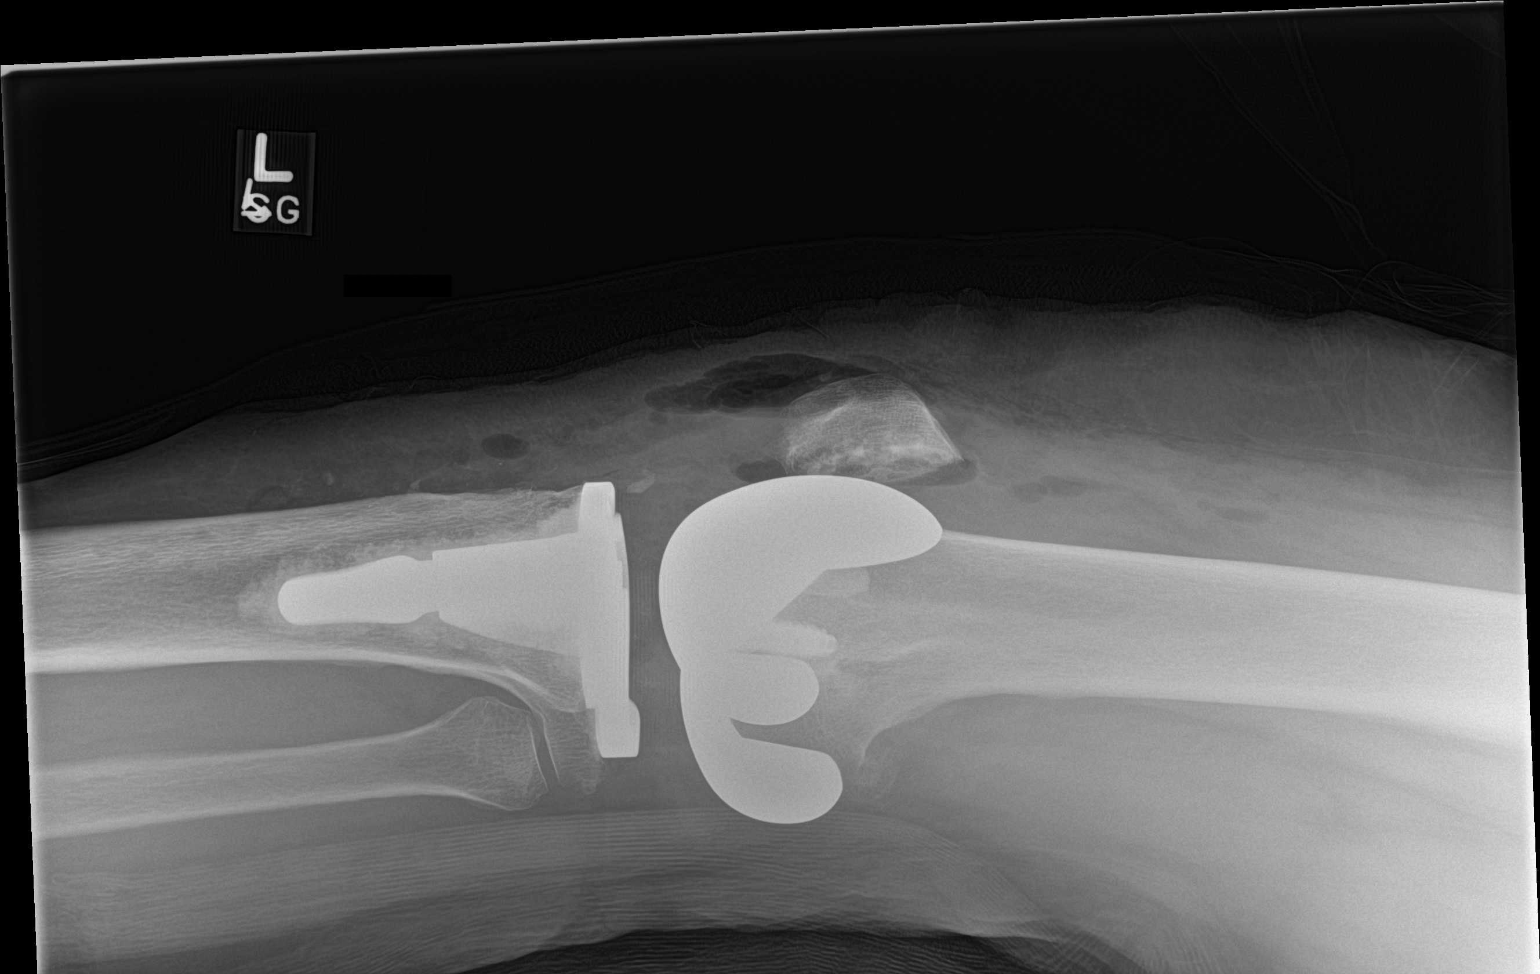

[knee ap]
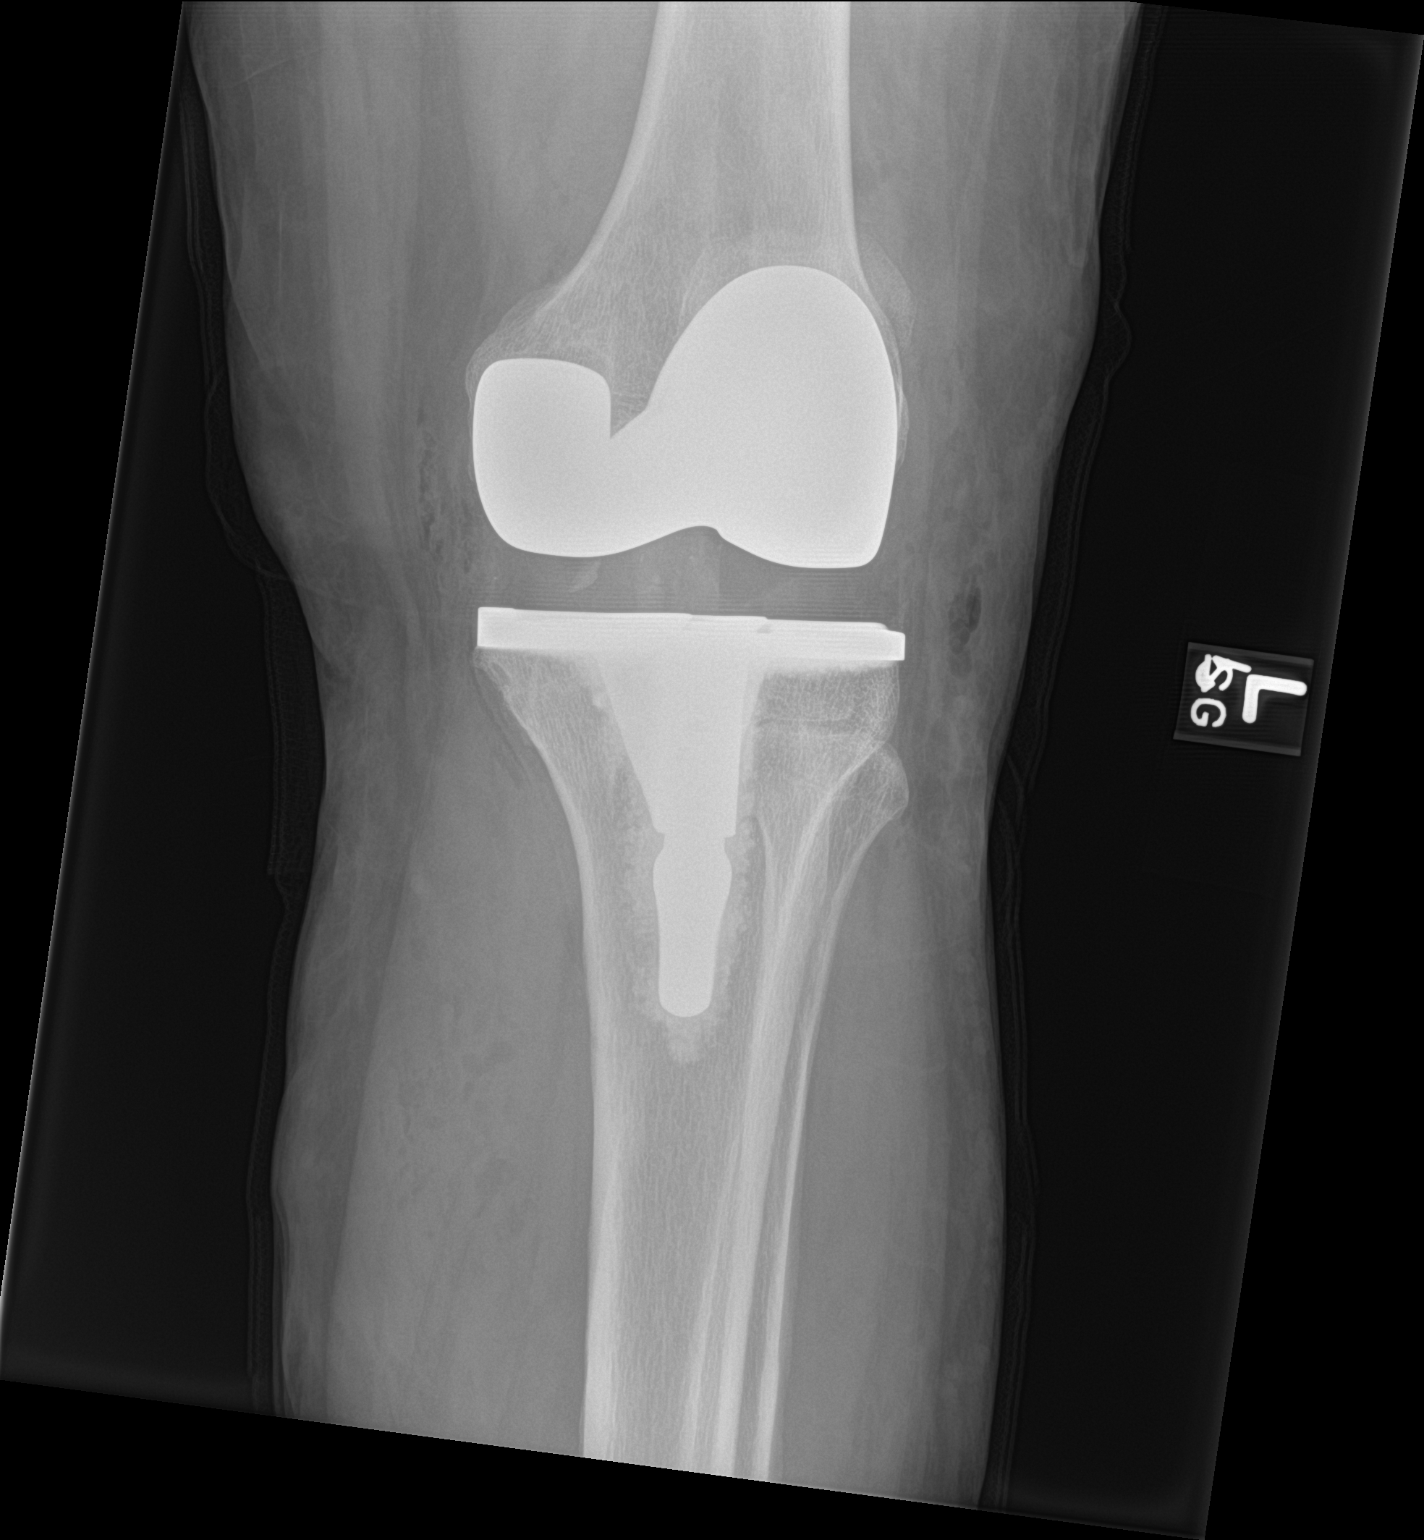

[2 of 2 positions shown; findings below may reference images not displayed]

FINDINGS: New total knee arthroplasty is in place. The device is located. No
fracture. Gas in the soft tissues from surgery noted.
IMPRESSION: Status post left total knee replacement.  No acute finding.

## 2021-08-14 ENCOUNTER — Encounter: Payer: Self-pay | Admitting: Family

## 2021-08-19 ENCOUNTER — Inpatient Hospital Stay (HOSPITAL_BASED_OUTPATIENT_CLINIC_OR_DEPARTMENT_OTHER): Payer: 59 | Admitting: Family

## 2021-08-19 ENCOUNTER — Encounter: Payer: Self-pay | Admitting: Family

## 2021-08-19 ENCOUNTER — Other Ambulatory Visit: Payer: Self-pay

## 2021-08-19 ENCOUNTER — Inpatient Hospital Stay: Payer: 59 | Attending: Family

## 2021-08-19 VITALS — BP 130/80 | HR 82 | Temp 98.0°F | Resp 18 | Wt 223.8 lb

## 2021-08-19 DIAGNOSIS — K909 Intestinal malabsorption, unspecified: Secondary | ICD-10-CM | POA: Insufficient documentation

## 2021-08-19 DIAGNOSIS — D508 Other iron deficiency anemias: Secondary | ICD-10-CM

## 2021-08-19 DIAGNOSIS — Z9884 Bariatric surgery status: Secondary | ICD-10-CM

## 2021-08-19 DIAGNOSIS — D509 Iron deficiency anemia, unspecified: Secondary | ICD-10-CM | POA: Insufficient documentation

## 2021-08-19 LAB — CBC WITH DIFFERENTIAL (CANCER CENTER ONLY)
Abs Immature Granulocytes: 0.03 10*3/uL (ref 0.00–0.07)
Basophils Absolute: 0 10*3/uL (ref 0.0–0.1)
Basophils Relative: 1 %
Eosinophils Absolute: 0.1 10*3/uL (ref 0.0–0.5)
Eosinophils Relative: 2 %
HCT: 34.3 % — ABNORMAL LOW (ref 36.0–46.0)
Hemoglobin: 10.7 g/dL — ABNORMAL LOW (ref 12.0–15.0)
Immature Granulocytes: 1 %
Lymphocytes Relative: 35 %
Lymphs Abs: 1.5 10*3/uL (ref 0.7–4.0)
MCH: 23.7 pg — ABNORMAL LOW (ref 26.0–34.0)
MCHC: 31.2 g/dL (ref 30.0–36.0)
MCV: 75.9 fL — ABNORMAL LOW (ref 80.0–100.0)
Monocytes Absolute: 0.6 10*3/uL (ref 0.1–1.0)
Monocytes Relative: 14 %
Neutro Abs: 2 10*3/uL (ref 1.7–7.7)
Neutrophils Relative %: 47 %
Platelet Count: 358 10*3/uL (ref 150–400)
RBC: 4.52 MIL/uL (ref 3.87–5.11)
RDW: 14.9 % (ref 11.5–15.5)
WBC Count: 4.2 10*3/uL (ref 4.0–10.5)
nRBC: 0 % (ref 0.0–0.2)

## 2021-08-19 LAB — RETICULOCYTES
Immature Retic Fract: 15.8 % (ref 2.3–15.9)
RBC.: 4.5 MIL/uL (ref 3.87–5.11)
Retic Count, Absolute: 43.2 10*3/uL (ref 19.0–186.0)
Retic Ct Pct: 1 % (ref 0.4–3.1)

## 2021-08-19 NOTE — Progress Notes (Signed)
?Hematology and Oncology Follow Up Visit ? ?Brandi Lutz ?[id number] ?1975-08-04 46 y.o. ?08/19/2021 ? ? 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  ?Current Therapy:        ?IV iron as indicated ?  ?Interim History:  Brandi Lutz is here today for follow-up. [name] symptomatic with fatigue, itching with no visible rash and craving ice.  ?She states that her cycle is regular with light flow.  ?No other blood loss noted. No bruising or petechiae.  ?No fever, chills, n/v, cough, dizziness, SOB, chest pain, palpitations, abdominal pain or changes in bowel or bladder habits.  ?No swelling, tenderness, numbness or tingling in her extremities at this time.  ?Pedal pulses are 2+.  ?She has maintained a good appetite and is staying well hydrated. Her weight is 223 lbs.  ? ?ECOG Performance Status: 1 - Symptomatic but completely ambulatory ? ?Medications:  ?Allergies as of 08/19/2021   ? ?   Reactions  ? Tape Rash  ? ?  ? ?  ?Medication List  ?  ? ?  ? Accurate as of August 19, 2021  2:14 PM. If you have any questions, ask your nurse or doctor.  ?  ?  ? ?  ? ?buPROPion 150 MG 24 hr tablet ?Commonly known as: WELLBUTRIN XL ?Take 150 mg by mouth daily. ?  ?phentermine 37.5 MG tablet ?Commonly known as: ADIPEX-P ?Take 37.5 mg by mouth daily. ?  ?sertraline 50 MG tablet ?Commonly known as: ZOLOFT ?Take 50 mg by mouth daily. ?  ? ?  ? ? ?Allergies:  ?Allergies  ?Allergen Reactions  ? Tape Rash  ? ? ?Past Medical History, Surgical history, Social history, and Family History were reviewed and updated. ? ?Review of Systems: ?All other 10 point review of systems is negative.  ? ?Physical Exam: ? vitals were not taken for this visit.  ? ?Wt Readings from Last 3 Encounters:  ?09/16/20 216 lb 1.9 oz (98 kg)  ?06/20/20 213 lb 6.4 oz (96.8 kg)  ?03/14/20 216 lb (98 kg)  ? ? ?Ocular: Sclerae unicteric, pupils equal, round  and reactive to light ?Ear-nose-throat: Oropharynx clear, dentition fair ?Lymphatic: No cervical or supraclavicular adenopathy ?Lungs no rales or rhonchi, good excursion bilaterally ?Heart regular rate and rhythm, no murmur appreciated ?Abd soft, nontender, positive bowel sounds ?MSK no focal spinal tenderness, no joint edema ?Neuro: non-focal, well-oriented, appropriate affect ?Breasts: Deferred  ? ?Lab Results  ?Component Value Date  ? WBC 4.2 08/19/2021  ? HGB 10.7 (L) 08/19/2021  ? HCT 34.3 (L) 08/19/2021  ? MCV 75.9 (L) 08/19/2021  ? PLT 358 08/19/2021  ? ?Lab Results  ?Component Value Date  ? FERRITIN 45 09/16/2020  ? IRON 87 09/16/2020  ? TIBC 370 09/16/2020  ? UIBC 283 09/16/2020  ? IRONPCTSAT 23 09/16/2020  ? ?Lab Results  ?Component Value Date  ? RETICCTPCT 1.0 08/19/2021  ? RBC 4.50 08/19/2021  ? RETICCTABS 31.3 05/02/2015  ? ?No results found for: KPAFRELGTCHN, LAMBDASER, KAPLAMBRATIO ?No results found for: IGGSERUM, IGA, IGMSERUM ?No results found for: TOTALPROTELP, ALBUMINELP, A1GS, A2GS, BETS, BETA2SER, GAMS, MSPIKE, SPEI ?  Chemistry   ?   ?Component Value Date/Time  ? NA 140 09/16/2020 1343  ? K 4.1 09/16/2020 1343  ? CL 105 09/16/2020 1343  ? CO2 30 09/16/2020 1343  ? BUN 12 09/16/2020 1343  ? CREATININE 0.93 09/16/2020 1343  ?    ?  Component Value Date/Time  ? CALCIUM 9.0 09/16/2020 1343  ? ALKPHOS 99 09/16/2020 1343  ? AST 43 (H) 09/16/2020 1343  ? ALT 70 (H) 09/16/2020 1343  ? BILITOT 0.4 09/16/2020 1343  ?  ? ? ? ?Impression and Plan: Brandi Lutz is a very pleasant 46 yo African American female with iron deficiency anemia secondary to malabsorption after gastric bypass.  ?Iron studies are pending.  ?Follow-up in 6 months.  ? ?Lottie Dawson, NP ?3/8/20232:14 PM ? ?

## 2021-08-20 LAB — IRON AND IRON BINDING CAPACITY (CC-WL,HP ONLY)
Iron: 36 ug/dL (ref 28–170)
Saturation Ratios: 7 % — ABNORMAL LOW (ref 10.4–31.8)
TIBC: 491 ug/dL — ABNORMAL HIGH (ref 250–450)
UIBC: 455 ug/dL — ABNORMAL HIGH (ref 148–442)

## 2021-08-20 LAB — FERRITIN: Ferritin: 8 ng/mL — ABNORMAL LOW (ref 11–307)

## 2021-08-24 ENCOUNTER — Ambulatory Visit: Payer: Managed Care, Other (non HMO) | Admitting: Family

## 2021-08-24 ENCOUNTER — Other Ambulatory Visit: Payer: Self-pay | Admitting: Family

## 2021-08-24 ENCOUNTER — Telehealth: Payer: Self-pay | Admitting: *Deleted

## 2021-08-24 ENCOUNTER — Other Ambulatory Visit: Payer: Managed Care, Other (non HMO)

## 2021-08-24 NOTE — Telephone Encounter (Signed)
Per 08/19/21 los - called and gave upcoming appointments - confirmed ?

## 2021-08-28 ENCOUNTER — Other Ambulatory Visit: Payer: Self-pay

## 2021-08-28 ENCOUNTER — Inpatient Hospital Stay: Payer: 59

## 2021-08-28 VITALS — BP 161/58 | HR 69 | Temp 98.2°F | Resp 17

## 2021-08-28 DIAGNOSIS — Z9884 Bariatric surgery status: Secondary | ICD-10-CM

## 2021-08-28 DIAGNOSIS — D509 Iron deficiency anemia, unspecified: Secondary | ICD-10-CM | POA: Diagnosis not present

## 2021-08-28 DIAGNOSIS — D508 Other iron deficiency anemias: Secondary | ICD-10-CM

## 2021-08-28 DIAGNOSIS — K909 Intestinal malabsorption, unspecified: Secondary | ICD-10-CM | POA: Diagnosis not present

## 2021-08-28 MED ORDER — SODIUM CHLORIDE 0.9 % IV SOLN: Freq: Once | INTRAVENOUS | Status: AC

## 2021-08-28 MED ORDER — SODIUM CHLORIDE 0.9 % IV SOLN
300.0000 mg | Freq: Once | INTRAVENOUS | Status: AC
  Administered 2021-08-28: 300 mg via INTRAVENOUS
  Filled 2021-08-28: qty 300

## 2021-08-28 NOTE — Patient Instructions (Signed)

## 2021-09-04 ENCOUNTER — Other Ambulatory Visit: Payer: Self-pay

## 2021-09-04 ENCOUNTER — Inpatient Hospital Stay: Payer: 59

## 2021-09-04 ENCOUNTER — Encounter: Payer: Self-pay | Admitting: Family

## 2021-09-04 VITALS — BP 124/72 | HR 78 | Temp 97.9°F | Resp 16

## 2021-09-04 DIAGNOSIS — K909 Intestinal malabsorption, unspecified: Secondary | ICD-10-CM | POA: Diagnosis not present

## 2021-09-04 DIAGNOSIS — Z9884 Bariatric surgery status: Secondary | ICD-10-CM

## 2021-09-04 DIAGNOSIS — D508 Other iron deficiency anemias: Secondary | ICD-10-CM

## 2021-09-04 DIAGNOSIS — D509 Iron deficiency anemia, unspecified: Secondary | ICD-10-CM | POA: Diagnosis not present

## 2021-09-04 MED ORDER — SODIUM CHLORIDE 0.9 % IV SOLN
300.0000 mg | Freq: Once | INTRAVENOUS | Status: AC
  Administered 2021-09-04: 300 mg via INTRAVENOUS
  Filled 2021-09-04: qty 300

## 2021-09-04 MED ORDER — SODIUM CHLORIDE 0.9 % IV SOLN: Freq: Once | INTRAVENOUS | Status: AC

## 2021-09-04 NOTE — Patient Instructions (Signed)

## 2021-09-07 ENCOUNTER — Encounter: Payer: Self-pay | Admitting: Family

## 2021-09-10 ENCOUNTER — Inpatient Hospital Stay: Payer: 59

## 2021-09-10 VITALS — BP 137/65 | HR 80 | Temp 98.3°F | Resp 16

## 2021-09-10 DIAGNOSIS — D508 Other iron deficiency anemias: Secondary | ICD-10-CM

## 2021-09-10 DIAGNOSIS — D509 Iron deficiency anemia, unspecified: Secondary | ICD-10-CM | POA: Diagnosis not present

## 2021-09-10 DIAGNOSIS — K909 Intestinal malabsorption, unspecified: Secondary | ICD-10-CM | POA: Diagnosis not present

## 2021-09-10 DIAGNOSIS — Z9884 Bariatric surgery status: Secondary | ICD-10-CM

## 2021-09-10 MED ORDER — SODIUM CHLORIDE 0.9 % IV SOLN
300.0000 mg | Freq: Once | INTRAVENOUS | Status: AC
  Administered 2021-09-10: 300 mg via INTRAVENOUS
  Filled 2021-09-10: qty 300

## 2021-09-10 MED ORDER — SODIUM CHLORIDE 0.9 % IV SOLN: Freq: Once | INTRAVENOUS | Status: AC

## 2021-09-10 NOTE — Patient Instructions (Signed)

## 2021-11-02 DIAGNOSIS — Z319 Encounter for procreative management, unspecified: Secondary | ICD-10-CM | POA: Diagnosis not present

## 2021-11-02 DIAGNOSIS — E288 Other ovarian dysfunction: Secondary | ICD-10-CM | POA: Diagnosis not present

## 2021-11-02 DIAGNOSIS — Z3183 Encounter for assisted reproductive fertility procedure cycle: Secondary | ICD-10-CM | POA: Diagnosis not present

## 2021-11-02 DIAGNOSIS — N979 Female infertility, unspecified: Secondary | ICD-10-CM | POA: Diagnosis not present

## 2021-11-16 DIAGNOSIS — S93601A Unspecified sprain of right foot, initial encounter: Secondary | ICD-10-CM | POA: Diagnosis not present

## 2021-11-16 DIAGNOSIS — M76821 Posterior tibial tendinitis, right leg: Secondary | ICD-10-CM | POA: Diagnosis not present

## 2021-11-16 DIAGNOSIS — M25571 Pain in right ankle and joints of right foot: Secondary | ICD-10-CM | POA: Diagnosis not present

## 2021-11-18 DIAGNOSIS — E669 Obesity, unspecified: Secondary | ICD-10-CM | POA: Diagnosis not present

## 2021-11-18 DIAGNOSIS — Z0189 Encounter for other specified special examinations: Secondary | ICD-10-CM | POA: Diagnosis not present

## 2021-11-18 DIAGNOSIS — Z01419 Encounter for gynecological examination (general) (routine) without abnormal findings: Secondary | ICD-10-CM | POA: Diagnosis not present

## 2021-11-23 DIAGNOSIS — Z3169 Encounter for other general counseling and advice on procreation: Secondary | ICD-10-CM | POA: Diagnosis not present

## 2021-11-23 DIAGNOSIS — E611 Iron deficiency: Secondary | ICD-10-CM | POA: Diagnosis not present

## 2021-11-23 DIAGNOSIS — N978 Female infertility of other origin: Secondary | ICD-10-CM | POA: Diagnosis not present

## 2021-11-23 DIAGNOSIS — Z9884 Bariatric surgery status: Secondary | ICD-10-CM | POA: Diagnosis not present

## 2021-11-23 DIAGNOSIS — Z85038 Personal history of other malignant neoplasm of large intestine: Secondary | ICD-10-CM | POA: Diagnosis not present

## 2022-01-08 ENCOUNTER — Encounter: Payer: Self-pay | Admitting: Family

## 2022-01-27 ENCOUNTER — Ambulatory Visit: Payer: No Typology Code available for payment source | Admitting: Medical

## 2022-01-27 VITALS — BP 137/84 | HR 70 | Resp 18 | Ht 68.0 in | Wt 245.0 lb

## 2022-01-27 DIAGNOSIS — F329 Major depressive disorder, single episode, unspecified: Secondary | ICD-10-CM | POA: Diagnosis not present

## 2022-01-27 DIAGNOSIS — R5383 Other fatigue: Secondary | ICD-10-CM

## 2022-01-27 DIAGNOSIS — Z86010 Personal history of colonic polyps: Secondary | ICD-10-CM

## 2022-01-27 DIAGNOSIS — M2550 Pain in unspecified joint: Secondary | ICD-10-CM

## 2022-01-27 DIAGNOSIS — Z85038 Personal history of other malignant neoplasm of large intestine: Secondary | ICD-10-CM

## 2022-01-27 DIAGNOSIS — D508 Other iron deficiency anemias: Secondary | ICD-10-CM

## 2022-01-27 DIAGNOSIS — R739 Hyperglycemia, unspecified: Secondary | ICD-10-CM

## 2022-01-27 MED ORDER — BUPROPION HCL ER (XL) 150 MG PO TB24: 150.0000 mg | ORAL_TABLET | Freq: Every day | ORAL | 3 refills | Status: DC

## 2022-01-27 MED ORDER — SERTRALINE HCL 50 MG PO TABS: 50.0000 mg | ORAL_TABLET | Freq: Every day | ORAL | 3 refills | Status: DC

## 2022-01-27 NOTE — Patient Instructions (Addendum)
Some depression reported mostly associated with work.  Refilling sertraline and Wellbutrin today.  Anemia with iron deficiency.  Will defer CBC and iron studies to hematologist.  Regularly sees patient.  History of colon cancer.  Patient is up-to-date on colonoscopies.  Report notes 1 upcoming in the near future.  Elevated sugar of mildly but not in diabetic range since gastric bypass surgery.  I will place future F6O and metabolic panel to be done within a week.  So recurrent weight gain since bypass and might consider weight loss medication such as Wegovy after reviewing A1c.  Will need to review if history of gastric bypass contraindication.  Patient has no history of thyroid cancer in family and no history of pancreatitis.  Arthralgias minimal presently but strong family history of rheumatoid arthritis.  Placed future order for inflammatory lab studies.  Fatigue reported so added B12, TSH and T4 to future labs.  Follow-up date to be determined after reviewing labs yet to be done.

## 2022-01-27 NOTE — Progress Notes (Signed)
Subjective:    Patient ID: Brandi Lutz, female    DOB: 18-Mar-1976, 46 y.o.   MRN: [id number]  HPI  Pt in for first time.  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 Pt does not exercise regularly. Pt states works from home. She walks her dogs but very slowly. Pt states diet not very healthy as she is stressed with work. Recenlty not eating much fruits or vegetables.  Often times will snack on carbs. Rare social drinker. Family from [place]. Nonsmoker.  History of gastric bypass.  Pt states joint pain in left hand. Fh of RA. Mom, aunt and grandmom have.  Depression- on wellbutrin in past ran out 2 weeks ago. [name] also on sertraline.   Anemia- 6 months after gastric bypass had anemia.   Pt states she had ascending colon removed. She had colon cancer at age 47 yo.   Pt states last pap 2 years ago and she understands was normal.   Diabetes- no diabetes since she had gastic bypass. A1c never went above bypass.   Elevated bp at times an hx of htn.      Review of Systems  Constitutional:  Negative for chills, diaphoresis, fatigue and fever.  HENT:  Negative for congestion and drooling.   Respiratory:  Negative for cough, chest tightness, shortness of breath and wheezing.   Cardiovascular:  Negative for chest pain and palpitations.  Gastrointestinal:  Negative for abdominal pain, blood in stool, diarrhea, rectal pain and vomiting.  Genitourinary:  Negative for difficulty urinating and dysuria.  Musculoskeletal:  Negative for back pain, joint swelling and myalgias.  Skin:  Negative for rash.    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     Social History   Socioeconomic History   Marital status: Single    Spouse name: Not on file   Number of children: Not on file   Years of education: Not on file   Highest education level: Not on file  Occupational History   Not on file  Tobacco Use   Smoking status: Never   Smokeless tobacco: Never  Vaping Use   Vaping Use: Never used  Substance and Sexual Activity   Alcohol use: Yes    Alcohol/week: 0.0 standard drinks of alcohol    Comment: rarely   Drug use: No   Sexual activity: Not Currently  Other Topics Concern   Not on file  Social History Narrative   Not on file   Social Determinants of Health   Financial Resource Strain: Not on file  Food Insecurity: Not on file  Transportation Needs: Not on file  Physical Activity: Not on file  Stress: Not on file  Social Connections: Not on file  Intimate Partner Violence: Not on file  Past Surgical History:  Procedure Laterality Date   APPENDECTOMY     ARTHROSCOPIC REPAIR ACL Left 20 yrs ago   CHOLECYSTECTOMY     COLECTOMY     COLONOSCOPY     GASTRIC BYPASS     herniated bowel repair     KNEE ARTHROSCOPY Left    PARTIAL KNEE ARTHROPLASTY Left 08/05/2014   Procedure: LEFT UNICOMPARTMENTAL KNEE;  Surgeon: Marianna Payment, MD;  Location: [place];  Service: Orthopedics;  Laterality: Left;   TOTAL KNEE ARTHROPLASTY Left 01/29/2019   TOTAL KNEE REVISION Left 01/29/2019   Procedure: CONVERSION OF LEFT UNICOMPARTMENTAL KNEE ARTHROPLASTY TO LEFT TOTAL KNEE ARTHROPLASTY;  Surgeon: Leandrew Koyanagi, MD;  Location: [facility];  Service: Orthopedics;  Laterality: Left;    No family history on file.  Allergies  Allergen Reactions   Tape Rash     Current Outpatient Medications on File Prior to Visit  Medication Sig Dispense Refill   buPROPion (WELLBUTRIN XL) 150 MG 24 hr tablet Take 150 mg by mouth daily.     sertraline (ZOLOFT) 50 MG tablet Take 50 mg by mouth daily.     No current facility-administered medications on file prior to visit.    BP 137/84   Pulse 70   Resp 18   Ht '5\' 8"'$  (1.727 m)   Wt 245 lb (111.1 kg)   LMP 01/22/2022   SpO2 99%   BMI 37.25 kg/m        Objective:   Physical Exam  General Mental Status- Alert. General Appearance- Not in acute distress.   Skin General: Color- Normal Color. Moisture- Normal Moisture.  Neck Carotid Arteries- Normal color. Moisture- Normal Moisture. No carotid bruits. No JVD.  Chest and Lung Exam Auscultation: Breath Sounds:-Normal.  Cardiovascular Auscultation:Rythm- Regular. Murmurs & Other Heart Sounds:Auscultation of the heart reveals- No Murmurs.  Abdomen Inspection:-Inspeection Normal. Palpation/Percussion:Note:No mass. Palpation and Percussion of the abdomen reveal- Non Tender, Non Distended + BS, no rebound or guarding.   Neurologic Cranial Nerve exam:- CN III-XII intact(No nystagmus), symmetric smile. Strength:- 5/5 equal and symmetric strength both upper and lower extremities.       Assessment & Plan:   Patient Instructions  Some depression reported mostly associated with work.  Refilling sertraline and Wellbutrin today.  Anemia with iron deficiency.  Will defer CBC and iron studies to hematologist.  Regularly sees patient.  History of colon cancer.  Patient is up-to-date on colonoscopies.  Report notes 1 upcoming in the near future.  Elevated sugar of mildly but not in diabetic range since gastric bypass surgery.  I will place future S2A and metabolic panel to be done within a week.  So recurrent weight gain since bypass and might consider weight loss medication such as Wegovy after reviewing A1c.  Will need to review if history of gastric  bypass contraindication.  Patient has no history of thyroid cancer in family and no history of pancreatitis.  Arthralgias minimal presently but strong family history of rheumatoid arthritis.  Placed future order for inflammatory lab studies.  Fatigue reported so added B12, TSH and T4 to future labs.  Follow-up date to be determined after reviewing labs yet to be done.    Time spent with patient today was  47 minutes which consisted of chart review, discussing diagnoses, work up, treatment and documentation.

## 2022-02-19 ENCOUNTER — Other Ambulatory Visit: Payer: Self-pay

## 2022-02-19 ENCOUNTER — Inpatient Hospital Stay: Payer: No Typology Code available for payment source | Attending: Hematology & Oncology

## 2022-02-19 ENCOUNTER — Encounter: Payer: Self-pay | Admitting: Family

## 2022-02-19 ENCOUNTER — Inpatient Hospital Stay: Payer: No Typology Code available for payment source | Admitting: Family

## 2022-02-19 VITALS — BP 135/69 | HR 83 | Temp 98.1°F | Resp 18 | Ht 68.0 in | Wt 241.1 lb

## 2022-02-19 DIAGNOSIS — D508 Other iron deficiency anemias: Secondary | ICD-10-CM

## 2022-02-19 DIAGNOSIS — Z9884 Bariatric surgery status: Secondary | ICD-10-CM | POA: Insufficient documentation

## 2022-02-19 DIAGNOSIS — D509 Iron deficiency anemia, unspecified: Secondary | ICD-10-CM | POA: Insufficient documentation

## 2022-02-19 DIAGNOSIS — Z79899 Other long term (current) drug therapy: Secondary | ICD-10-CM | POA: Insufficient documentation

## 2022-02-19 LAB — CBC WITH DIFFERENTIAL (CANCER CENTER ONLY)
Abs Immature Granulocytes: 0.01 10*3/uL (ref 0.00–0.07)
Basophils Absolute: 0 10*3/uL (ref 0.0–0.1)
Basophils Relative: 1 %
Eosinophils Absolute: 0.1 10*3/uL (ref 0.0–0.5)
Eosinophils Relative: 2 %
HCT: 39.8 % (ref 36.0–46.0)
Hemoglobin: 12.8 g/dL (ref 12.0–15.0)
Immature Granulocytes: 0 %
Lymphocytes Relative: 38 %
Lymphs Abs: 1.5 10*3/uL (ref 0.7–4.0)
MCH: 26.5 pg (ref 26.0–34.0)
MCHC: 32.2 g/dL (ref 30.0–36.0)
MCV: 82.4 fL (ref 80.0–100.0)
Monocytes Absolute: 0.4 10*3/uL (ref 0.1–1.0)
Monocytes Relative: 10 %
Neutro Abs: 2 10*3/uL (ref 1.7–7.7)
Neutrophils Relative %: 49 %
Platelet Count: 297 10*3/uL (ref 150–400)
RBC: 4.83 MIL/uL (ref 3.87–5.11)
RDW: 13.7 % (ref 11.5–15.5)
WBC Count: 4 10*3/uL (ref 4.0–10.5)
nRBC: 0 % (ref 0.0–0.2)

## 2022-02-19 LAB — RETICULOCYTES
Immature Retic Fract: 8.4 % (ref 2.3–15.9)
RBC.: 4.79 MIL/uL (ref 3.87–5.11)
Retic Count, Absolute: 65.1 10*3/uL (ref 19.0–186.0)
Retic Ct Pct: 1.4 % (ref 0.4–3.1)

## 2022-02-19 LAB — FERRITIN: Ferritin: 9 ng/mL — ABNORMAL LOW (ref 11–307)

## 2022-02-19 NOTE — Progress Notes (Unsigned)
Hematology and Oncology Follow Up Visit  Brandi Lutz [id number] 25-Mar-1976 46 y.o. 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 If you have any questions, ask your nurse or doctor.          buPROPion 150 MG 24 hr tablet Commonly known as: WELLBUTRIN XL Take 150 mg by mouth daily. What changed: Another medication with the same name was removed. Continue taking this medication, and follow the directions you see here. Changed by: Lottie Dawson, NP   sertraline 50 MG tablet Commonly known as: ZOLOFT Take 50 mg by mouth daily.        Allergies:  Allergies  Allergen Reactions   Tape Rash    Past Medical History, Surgical history, Social history, and Family History were reviewed and updated.  Review of Systems: All other 10 point review of systems is negative.   Physical Exam:  vitals were not taken for this visit.   Wt Readings from Last 3 Encounters:  01/27/22 245 lb (111.1 kg)  08/19/21 223 lb 12.8 oz (101.5 kg)  09/16/20 216 lb 1.9 oz (98 kg)    Ocular: Sclerae unicteric, pupils equal, round and reactive to light Ear-nose-throat: Oropharynx clear, dentition fair Lymphatic: No cervical or supraclavicular adenopathy Lungs no rales or rhonchi, good excursion bilaterally Heart regular rate and rhythm, no murmur appreciated Abd soft, nontender, positive bowel sounds MSK no focal spinal tenderness, no joint edema Neuro: non-focal, well-oriented, appropriate affect Breasts: Deferred   Lab Results  Component Value Date   WBC 4.0  02/19/2022   HGB 12.8 02/19/2022   HCT 39.8 02/19/2022   MCV 82.4 02/19/2022   PLT 297 02/19/2022   Lab Results  Component Value Date   FERRITIN 8 (L) 08/19/2021   IRON 36 08/19/2021   TIBC 491 (H) 08/19/2021   UIBC 455 (H) 08/19/2021   IRONPCTSAT 7 (L) 08/19/2021   Lab Results  Component Value Date   RETICCTPCT 1.4 02/19/2022   RBC 4.83 02/19/2022   RBC 4.79 02/19/2022   RETICCTABS 31.3 05/02/2015   No results found for: "KPAFRELGTCHN", "LAMBDASER", "KAPLAMBRATIO" No results found for: "IGGSERUM", "IGA", "IGMSERUM" No results found for: "TOTALPROTELP", "ALBUMINELP", "A1GS", "A2GS", "BETS", "BETA2SER", "GAMS", "MSPIKE", "SPEI"   Chemistry      Component Value Date/Time   NA 140 09/16/2020 1343   K 4.1 09/16/2020 1343   CL 105 09/16/2020 1343   CO2 30 09/16/2020 1343   BUN 12 09/16/2020 1343   CREATININE 0.93 09/16/2020 1343      Component Value Date/Time   CALCIUM 9.0 09/16/2020 1343   ALKPHOS 99 09/16/2020 1343   AST 43 (H) 09/16/2020 1343   ALT 70 (H) 09/16/2020 1343   BILITOT 0.4 09/16/2020 1343       Impression and Plan: Brandi Lutz is a very pleasant 46 yo African American female with iron deficiency anemia secondary to malabsorption after gastric bypass.  Iron studies are pending.  Follow-up in 6 months.  Lottie Dawson, NP 9/8/20231:56 PM

## 2022-02-21 ENCOUNTER — Encounter (INDEPENDENT_AMBULATORY_CARE_PROVIDER_SITE_OTHER): Payer: Self-pay

## 2022-02-22 ENCOUNTER — Encounter: Payer: Self-pay | Admitting: Family

## 2022-02-22 LAB — IRON AND IRON BINDING CAPACITY (CC-WL,HP ONLY)
Iron: 61 ug/dL (ref 28–170)
Saturation Ratios: 15 % (ref 10.4–31.8)
TIBC: 412 ug/dL (ref 250–450)
UIBC: 351 ug/dL (ref 148–442)

## 2022-02-24 ENCOUNTER — Inpatient Hospital Stay: Payer: No Typology Code available for payment source

## 2022-02-24 VITALS — BP 121/79 | HR 78 | Temp 98.1°F | Resp 17

## 2022-02-24 DIAGNOSIS — K909 Intestinal malabsorption, unspecified: Secondary | ICD-10-CM

## 2022-02-24 DIAGNOSIS — D508 Other iron deficiency anemias: Secondary | ICD-10-CM

## 2022-02-24 DIAGNOSIS — D509 Iron deficiency anemia, unspecified: Secondary | ICD-10-CM | POA: Diagnosis not present

## 2022-02-24 DIAGNOSIS — Z9884 Bariatric surgery status: Secondary | ICD-10-CM

## 2022-02-24 MED ORDER — SODIUM CHLORIDE 0.9 % IV SOLN
300.0000 mg | Freq: Once | INTRAVENOUS | Status: AC
  Administered 2022-02-24: 300 mg via INTRAVENOUS
  Filled 2022-02-24: qty 300

## 2022-02-24 MED ORDER — SODIUM CHLORIDE 0.9 % IV SOLN: Freq: Once | INTRAVENOUS | Status: AC

## 2022-02-24 NOTE — Progress Notes (Signed)
Patient refused to wait 30 minutes post infusion. Released stable and ASX. 

## 2022-02-24 NOTE — Patient Instructions (Signed)

## 2022-03-09 ENCOUNTER — Inpatient Hospital Stay: Payer: No Typology Code available for payment source

## 2022-03-16 ENCOUNTER — Inpatient Hospital Stay: Payer: No Typology Code available for payment source | Attending: Hematology & Oncology

## 2022-03-16 VITALS — BP 151/82 | HR 78 | Temp 98.2°F | Resp 18

## 2022-03-16 DIAGNOSIS — D508 Other iron deficiency anemias: Secondary | ICD-10-CM | POA: Diagnosis not present

## 2022-03-16 DIAGNOSIS — Z9884 Bariatric surgery status: Secondary | ICD-10-CM | POA: Insufficient documentation

## 2022-03-16 DIAGNOSIS — K909 Intestinal malabsorption, unspecified: Secondary | ICD-10-CM

## 2022-03-16 DIAGNOSIS — K912 Postsurgical malabsorption, not elsewhere classified: Secondary | ICD-10-CM | POA: Diagnosis not present

## 2022-03-16 MED ORDER — SODIUM CHLORIDE 0.9 % IV SOLN
300.0000 mg | Freq: Once | INTRAVENOUS | Status: AC
  Administered 2022-03-16: 300 mg via INTRAVENOUS
  Filled 2022-03-16: qty 300

## 2022-03-16 MED ORDER — SODIUM CHLORIDE 0.9 % IV SOLN: Freq: Once | INTRAVENOUS | Status: AC

## 2022-03-16 NOTE — Patient Instructions (Signed)

## 2022-03-23 ENCOUNTER — Inpatient Hospital Stay: Payer: No Typology Code available for payment source

## 2022-03-23 VITALS — BP 130/70 | HR 74 | Resp 16

## 2022-03-23 DIAGNOSIS — D508 Other iron deficiency anemias: Secondary | ICD-10-CM | POA: Diagnosis not present

## 2022-03-23 DIAGNOSIS — K909 Intestinal malabsorption, unspecified: Secondary | ICD-10-CM

## 2022-03-23 DIAGNOSIS — Z9884 Bariatric surgery status: Secondary | ICD-10-CM

## 2022-03-23 MED ORDER — SODIUM CHLORIDE 0.9 % IV SOLN
300.0000 mg | Freq: Once | INTRAVENOUS | Status: AC
  Administered 2022-03-23: 300 mg via INTRAVENOUS
  Filled 2022-03-23: qty 300

## 2022-03-23 MED ORDER — SODIUM CHLORIDE 0.9 % IV SOLN: Freq: Once | INTRAVENOUS | Status: AC

## 2022-03-23 NOTE — Patient Instructions (Signed)

## 2022-04-16 ENCOUNTER — Telehealth (INDEPENDENT_AMBULATORY_CARE_PROVIDER_SITE_OTHER): Payer: No Typology Code available for payment source | Admitting: Medical

## 2022-04-16 ENCOUNTER — Other Ambulatory Visit (INDEPENDENT_AMBULATORY_CARE_PROVIDER_SITE_OTHER): Payer: No Typology Code available for payment source

## 2022-04-16 DIAGNOSIS — R739 Hyperglycemia, unspecified: Secondary | ICD-10-CM

## 2022-04-16 DIAGNOSIS — M2550 Pain in unspecified joint: Secondary | ICD-10-CM

## 2022-04-16 DIAGNOSIS — F419 Anxiety disorder, unspecified: Secondary | ICD-10-CM | POA: Diagnosis not present

## 2022-04-16 DIAGNOSIS — F329 Major depressive disorder, single episode, unspecified: Secondary | ICD-10-CM | POA: Diagnosis not present

## 2022-04-16 DIAGNOSIS — R5383 Other fatigue: Secondary | ICD-10-CM | POA: Diagnosis not present

## 2022-04-16 MED ORDER — ALPRAZOLAM 0.5 MG PO TABS: ORAL_TABLET | ORAL | 0 refills | Status: DC

## 2022-04-16 NOTE — Addendum Note (Signed)
Addended by: Kem Boroughs D on: 04/16/2022 03:26 PM   Modules accepted: Orders

## 2022-04-16 NOTE — Progress Notes (Signed)
Subjective:    Patient ID: Brandi Lutz, female    DOB: 07-Sep-1975, 46 y.o.   MRN: [id number]  HPI  Virtual Visit via Video Note  I connected with Brandi Lutz on 04/16/22 at  8:20 AM EDT by a video enabled telemedicine application and verified that I am speaking with the correct person using two identifiers.  Location: Patient: home Provider: office   I discussed the limitations of evaluation and management by telemedicine and the availability of in person appointments. The patient expressed understanding and agreed to proceed.  History of Present Illness:   Pt states recently has been extremely anxious. More than recently. 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 End of year review and struggles with sleeping. Pt states her niece noticed she seemed very anxious.  Pt states she has not taken xanax  in very long time. Pt states in past would just use prn for severe anxiety or panic attack. However recently not to level of panic attack.  Years since she had to use xanax.  Pt states she is not depressed and hopeful. [name] on sertraline.   Pt wants to try xanax again.  Another issues she has is she is eating excessively recently. Pt states not hungry. Pt feels like stress eating.   Last visit notes" "Elevated sugar of mildly but not in diabetic range since gastric bypass surgery.  I will place future W0J and metabolic panel to be done within a week.  So recurrent weight gain since bypass and might consider weight loss medication such as Wegovy after reviewing A1c.  Will need to review if history of gastric bypass contraindication.  Patient has no history of thyroid cancer in family and no history of pancreatitis."  Pt has concerns about regaining her weight and had hisotry of gastric bypass.  She never got labs   Observations/Objective:  General-no acute distress, pleasant, oriented. Lungs- on inspection lungs appear unlabored. Neck- no tracheal  deviation or jvd on inspection. Neuro- gross motor function appears intact.    Assessment and Plan:  Patient Instructions  Depression well controlled presently with Wellbutrin and sertraline.  For recent anxiety has increased.  Describing moderate to severe anxiety but not having panic attacks.  Formally had been on Xanax.  After discussion decided would prescribe you moderate low-dose with limited number 12 tablets.  Instruction 1/2 to 1 tablet twice daily as needed anxiety.  We will check frequency of use on a 2-week follow-up.  Dentist number of tablets to give per month going forward.  Also at that point have the sign contract and give urine drug screen.  Obesity with history of gastric bypass.  On review sugar levels over the last 6 to 7 years.  Do think best that you go ahead and do labs to include A1c.  If A1c elevated then investigate use of Ozempic.  Would need some clarification from pharmacist if any contraindications with history of gastric bypass.  Also considering metformin.  Fatigue and arthralgias.  Placed labs again.  Asked that you call today and see if she can get put on the lab schedule.  Follow-up in 2 weeks or sooner if needed.   Mackie Pai, PA-C  Follow Up Instructions:    I discussed the assessment and treatment plan with the patient. The patient was provided an opportunity to ask questions and all were answered. The patient agreed with the plan and demonstrated an understanding of the instructions.   The  patient was advised to call back or seek an in-person evaluation if the symptoms worsen or if the condition fails to improve as anticipated.    Mackie Pai, PA-C   Review of Systems     Objective:   Physical Exam        Assessment & Plan:

## 2022-04-16 NOTE — Patient Instructions (Signed)
Depression well controlled presently with Wellbutrin and sertraline.  For recent anxiety has increased.  Describing moderate to severe anxiety but not having panic attacks.  Formally had been on Xanax.  After discussion decided would prescribe you moderate low-dose with limited number 12 tablets.  Instruction 1/2 to 1 tablet twice daily as needed anxiety.  We will check frequency of use on a 2-week follow-up.  Dentist number of tablets to give per month going forward.  Also at that point have the sign contract and give urine drug screen.  Obesity with history of gastric bypass.  On review sugar levels over the last 6 to 7 years.  Do think best that you go ahead and do labs to include A1c.  If A1c elevated then investigate use of Ozempic.  Would need some clarification from pharmacist if any contraindications with history of gastric bypass.  Also considering metformin.  Fatigue and arthralgias.  Placed labs again.  Asked that you call today and see if she can get put on the lab schedule.  Follow-up in 2 weeks or sooner if needed.

## 2022-04-17 LAB — COMPREHENSIVE METABOLIC PANEL
ALT: 41 IU/L — ABNORMAL HIGH (ref 0–32)
AST: 31 IU/L (ref 0–40)
Albumin/Globulin Ratio: 2 (ref 1.2–2.2)
Albumin: 4.2 g/dL (ref 3.9–4.9)
Alkaline Phosphatase: 83 IU/L (ref 44–121)
BUN/Creatinine Ratio: 9 (ref 9–23)
BUN: 8 mg/dL (ref 6–24)
Bilirubin Total: 0.3 mg/dL (ref 0.0–1.2)
CO2: 26 mmol/L (ref 20–29)
Calcium: 9.1 mg/dL (ref 8.7–10.2)
Chloride: 105 mmol/L (ref 96–106)
Creatinine, Ser: 0.89 mg/dL (ref 0.57–1.00)
Globulin, Total: 2.1 g/dL (ref 1.5–4.5)
Glucose: 90 mg/dL (ref 70–99)
Potassium: 4.1 mmol/L (ref 3.5–5.2)
Sodium: 141 mmol/L (ref 134–144)
Total Protein: 6.3 g/dL (ref 6.0–8.5)
eGFR: 80 mL/min/{1.73_m2} (ref >59)

## 2022-04-17 LAB — RHEUMATOID FACTOR: Rheumatoid fact SerPl-aCnc: <10 IU/mL (ref <14.0)

## 2022-04-17 LAB — ANA: Anti Nuclear Antibody (ANA): NEGATIVE

## 2022-04-17 LAB — HEMOGLOBIN A1C
Est. average glucose Bld gHb Est-mCnc: 120 mg/dL
Hgb A1c MFr Bld: 5.8 % — ABNORMAL HIGH (ref 4.8–5.6)

## 2022-04-17 LAB — C-REACTIVE PROTEIN: CRP: <1 mg/L (ref 0–10)

## 2022-04-17 LAB — VITAMIN B12: Vitamin B-12: 226 pg/mL — ABNORMAL LOW (ref 232–1245)

## 2022-04-17 LAB — T4, FREE: Free T4: 1.22 ng/dL (ref 0.82–1.77)

## 2022-04-17 LAB — TSH: TSH: 1.52 u[IU]/mL (ref 0.450–4.500)

## 2022-04-17 LAB — SEDIMENTATION RATE: Sed Rate: 2 mm/hr (ref 0–32)

## 2022-04-19 ENCOUNTER — Telehealth: Payer: Self-pay

## 2022-04-19 ENCOUNTER — Telehealth: Payer: Self-pay | Admitting: Medical

## 2022-04-19 MED ORDER — SEMAGLUTIDE-WEIGHT MANAGEMENT 2.4 MG/0.75ML ~~LOC~~ SOAJ: 2.4000 mg | SUBCUTANEOUS | 0 refills | Status: DC

## 2022-04-19 MED ORDER — SEMAGLUTIDE-WEIGHT MANAGEMENT 0.5 MG/0.5ML ~~LOC~~ SOAJ: 0.5000 mg | SUBCUTANEOUS | 0 refills | Status: AC

## 2022-04-19 MED ORDER — SEMAGLUTIDE-WEIGHT MANAGEMENT 0.25 MG/0.5ML ~~LOC~~ SOAJ: 0.2500 mg | SUBCUTANEOUS | 0 refills | Status: AC

## 2022-04-19 MED ORDER — SEMAGLUTIDE-WEIGHT MANAGEMENT 1 MG/0.5ML ~~LOC~~ SOAJ: 1.0000 mg | SUBCUTANEOUS | 0 refills | Status: DC

## 2022-04-19 MED ORDER — SEMAGLUTIDE-WEIGHT MANAGEMENT 1.7 MG/0.75ML ~~LOC~~ SOAJ: 1.7000 mg | SUBCUTANEOUS | 0 refills | Status: DC

## 2022-04-19 NOTE — Telephone Encounter (Signed)
PA started

## 2022-04-19 NOTE — Addendum Note (Signed)
Addended by: Anabel Halon on: 04/19/2022 12:00 PM   Modules accepted: Orders

## 2022-04-19 NOTE — Telephone Encounter (Signed)
PA initiated via Covermymeds; KEY: [id number]. Awaiting determination.

## 2022-04-19 NOTE — Telephone Encounter (Signed)
Patient states the last time she was here, she had discussed getting on weight loss medication. She would like to know which medication she can get on. Please advise.

## 2022-04-20 LAB — SPECIMEN STATUS REPORT

## 2022-04-20 NOTE — Telephone Encounter (Signed)
PA approved. Effective 04/19/2022 to 11/15/2022.

## 2022-04-23 ENCOUNTER — Ambulatory Visit (INDEPENDENT_AMBULATORY_CARE_PROVIDER_SITE_OTHER): Payer: No Typology Code available for payment source | Admitting: *Deleted

## 2022-04-23 DIAGNOSIS — E538 Deficiency of other specified B group vitamins: Secondary | ICD-10-CM

## 2022-04-23 DIAGNOSIS — Z23 Encounter for immunization: Secondary | ICD-10-CM | POA: Diagnosis not present

## 2022-04-23 MED ORDER — CYANOCOBALAMIN 1000 MCG/ML IJ SOLN
1000.0000 ug | Freq: Once | INTRAMUSCULAR | Status: AC
  Administered 2022-04-23: 1000 ug via INTRAMUSCULAR

## 2022-04-23 NOTE — Progress Notes (Cosign Needed Addendum)
Pt here for first weekly b12 injection and flu vaccine per Mackie Pai, PA-C.  Original order from 04/16/22 labs: weekly b12 vitamin injection over next 4 weeks followed by monthly injection for 5 months. Then repeat b12 level in 6 months.   B12 injection given in left deltoid and flu vaccine given in right deltoid and patient tolerated well.  Next b12 injection scheduled for 04/30/22.  Mackie Pai, PA-C

## 2022-04-30 ENCOUNTER — Ambulatory Visit (INDEPENDENT_AMBULATORY_CARE_PROVIDER_SITE_OTHER): Payer: No Typology Code available for payment source

## 2022-04-30 DIAGNOSIS — E538 Deficiency of other specified B group vitamins: Secondary | ICD-10-CM | POA: Diagnosis not present

## 2022-04-30 MED ORDER — CYANOCOBALAMIN 1000 MCG/ML IJ SOLN
1000.0000 ug | Freq: Once | INTRAMUSCULAR | Status: AC
  Administered 2022-04-30: 1000 ug via INTRAMUSCULAR

## 2022-04-30 NOTE — Progress Notes (Cosign Needed)
Pt here for 2nd weekly b12 injection per Mackie Pai, PA-C.  Original order from 04/16/22 labs: weekly b12 vitamin injection over next 4 weeks followed by monthly injection for 5 months. Then repeat b12 level in 6 months.  B12 injection given in left deltoid and flu vaccine given in right deltoid and patient tolerated well.  Next b12 injection scheduled for 11/17/  Mackie Pai, PA-C     Tene A Tiller is a 46 y.o. female presents to the office today for B-12 2/4  injections, per physician's orders. Original order: 4 weekly followed by monthly injection for 5 months then recheck B-12 level in 6 months B-12 (med), 1000 MG  (dose),  Left deltoid (route) was administered  (location) today. Patient tolerated injection., appt made  Patient next injection due: , appt made Yes  Jaylynn Mcaleer M Yakelin Grenier   [employer], PA-C

## 2022-05-03 ENCOUNTER — Encounter: Payer: Self-pay | Admitting: Medical

## 2022-05-03 ENCOUNTER — Other Ambulatory Visit: Payer: Self-pay | Admitting: Medical

## 2022-05-03 ENCOUNTER — Telehealth (INDEPENDENT_AMBULATORY_CARE_PROVIDER_SITE_OTHER): Payer: No Typology Code available for payment source | Admitting: Medical

## 2022-05-03 DIAGNOSIS — F419 Anxiety disorder, unspecified: Secondary | ICD-10-CM

## 2022-05-03 DIAGNOSIS — E538 Deficiency of other specified B group vitamins: Secondary | ICD-10-CM | POA: Diagnosis not present

## 2022-05-03 MED ORDER — ALPRAZOLAM 0.5 MG PO TABS: ORAL_TABLET | ORAL | 0 refills | Status: DC

## 2022-05-03 NOTE — Patient Instructions (Signed)
Anxiety and depression.  Anxiety recently improved when taking Xanax.  Without Xanax describes extreme anxiety that interferes with work.  So after discussion decided we will prescribe 24 tablets/month and have would be on contract/give UDS.  Controlled medication rules explained.  She will come in and sign the contract and give drug screen this Wednesday.  Mood very well controlled with Wellbutrin and sertraline combination.  She will continue treatment regimen.  B12 deficiency-she is getting B12 injection this Wednesday.  Obesity and did get prior authorized for [facility] however the starting dose is not available at the pharmacy.  Presently she is on the wait list and it looks like she will get the first starting dose within 4 to 6 weeks.  If everything goes well follow-up in 6 months for anxiety but did ask patient to give me a date in 1 month after starting Wegovy as to how much weight she has lost as well as if having any adverse side effects.

## 2022-05-03 NOTE — Progress Notes (Signed)
   Subjective:    Patient ID: Brandi Lutz, female    DOB: 08-Apr-1976, 46 y.o.   MRN: [id number]  HPI Virtual Visit via Video Note  I connected with Brandi Lutz on 05/03/22 at  2:00 PM EST by a video enabled telemedicine application and verified that I am speaking with the correct person using two identifiers.  Location: Patient: home Provider: office.   I discussed the limitations of evaluation and management by telemedicine and the availability of in person appointments. The patient expressed understanding and agreed to proceed.  History of Present Illness: Pt in for follow up for anxiety, depression  and weight loss.  Pt state mood controlled with wellbutrin and sertaline.  Pt found that xanax helped a lot. She found it helped her a lot. She states helps tremendously with work anxiety. Describes tremendous over load of work. Same level of work as before but more anxious. I had written 12 tab on 04-16-3022 and she has 2 tablets left. On discussion pt does think 24 tablets tablet would be enough for a month.   Pt is obese and we had discussed wegovy. It was authorized but they don't have starter dose. [name] on wait list to within next 4-6 weeks.     Observations/Objective: General-no acute distress, pleasant, oriented. Lungs- on inspection lungs appear unlabored. Neck- no tracheal deviation or jvd on inspection. Neuro- gross motor function appears intact.   Assessment and Plan: Patient Instructions  Anxiety and depression.  Anxiety recently improved when taking Xanax.  Without Xanax describes extreme anxiety that interferes with work.  So after discussion decided we will prescribe 24 tablets/month and have would be on contract/give UDS.  Controlled medication rules explained.  She will come in and sign the contract and give drug screen this Wednesday.  Mood very well controlled with Wellbutrin and sertraline combination.  She will continue treatment regimen.  B12  deficiency-she is getting B12 injection this Wednesday.  Obesity and did get prior authorized for [facility] however the starting dose is not available at the pharmacy.  Presently she is on the wait list and it looks like she will get the first starting dose within 4 to 6 weeks.  If everything goes well follow-up in 6 months for anxiety but did ask patient to give me a date in 1 month after starting Wegovy as to how much weight she has lost as well as if having any adverse side effects.   Mackie Pai, PA-C   Follow Up Instructions:    I discussed the assessment and treatment plan with the patient. The patient was provided an opportunity to ask questions and all were answered. The patient agreed with the plan and demonstrated an understanding of the instructions.   The patient was advised to call back or seek an in-person evaluation if the symptoms worsen or if the condition fails to improve as anticipated.    Mackie Pai, PA-C    Review of Systems     Objective:   Physical Exam        Assessment & Plan:

## 2022-05-05 ENCOUNTER — Ambulatory Visit (INDEPENDENT_AMBULATORY_CARE_PROVIDER_SITE_OTHER): Payer: No Typology Code available for payment source

## 2022-05-05 ENCOUNTER — Other Ambulatory Visit: Payer: Self-pay

## 2022-05-05 ENCOUNTER — Other Ambulatory Visit: Payer: No Typology Code available for payment source

## 2022-05-05 DIAGNOSIS — Z79899 Other long term (current) drug therapy: Secondary | ICD-10-CM

## 2022-05-05 DIAGNOSIS — F419 Anxiety disorder, unspecified: Secondary | ICD-10-CM

## 2022-05-05 DIAGNOSIS — E538 Deficiency of other specified B group vitamins: Secondary | ICD-10-CM

## 2022-05-05 MED ORDER — CYANOCOBALAMIN 1000 MCG/ML IJ SOLN
1000.0000 ug | Freq: Once | INTRAMUSCULAR | Status: AC
  Administered 2022-05-05: 1000 ug via INTRAMUSCULAR

## 2022-05-05 NOTE — Progress Notes (Signed)
Pt here for 3th weekly b12 injection per Mackie Pai, PA-C.  Original order from 04/16/22 labs: weekly b12 vitamin injection over next 4 weeks followed by monthly injection for 5 months. Then repeat b12 level in 6 months.  B12 injection given in left deltoid and flu vaccine given in right deltoid and patient tolerated well.  Next b12 injection scheduled for 11/17/ and UDS Contract sign by pt and went to lab.  Mackie Pai, PA-C

## 2022-05-08 LAB — DRUG MONITORING PANEL 376104, URINE
Amphetamines: NEGATIVE ng/mL (ref <500)
Barbiturates: NEGATIVE ng/mL (ref <300)
Benzodiazepines: NEGATIVE ng/mL (ref <100)
Cocaine Metabolite: NEGATIVE ng/mL (ref <150)
Desmethyltramadol: NEGATIVE ng/mL (ref <100)
Opiates: NEGATIVE ng/mL (ref <100)
Oxycodone: NEGATIVE ng/mL (ref <100)
Tramadol: NEGATIVE ng/mL (ref <100)

## 2022-05-08 LAB — DM TEMPLATE

## 2022-05-12 ENCOUNTER — Ambulatory Visit (INDEPENDENT_AMBULATORY_CARE_PROVIDER_SITE_OTHER): Payer: No Typology Code available for payment source

## 2022-05-12 DIAGNOSIS — E538 Deficiency of other specified B group vitamins: Secondary | ICD-10-CM | POA: Diagnosis not present

## 2022-05-12 MED ORDER — CYANOCOBALAMIN 1000 MCG/ML IJ SOLN
1000.0000 ug | Freq: Once | INTRAMUSCULAR | Status: AC
  Administered 2022-05-12: 1000 ug via INTRAMUSCULAR

## 2022-05-12 NOTE — Progress Notes (Cosign Needed)
Pt here for monthly B12 injection per Mackie Pai  B12 1062mg given IM left deltoid, and pt tolerated injection well.  Next B12 injection scheduled for 06/10/2022  EMackie Pai PA-C

## 2022-05-14 ENCOUNTER — Encounter: Payer: Self-pay | Admitting: Medical

## 2022-05-20 LAB — VITAMIN B1

## 2022-06-02 ENCOUNTER — Other Ambulatory Visit: Payer: Self-pay | Admitting: Medical

## 2022-06-02 NOTE — Telephone Encounter (Signed)
Requesting: alprazolam 0.'5mg'$   Contract: 05/05/22 UDS: 05/05/22 Last Visit: 05/03/22 Next Visit: None Last Refill: 05/03/22 #24 and 0RF  Please Advise

## 2022-06-03 NOTE — Telephone Encounter (Signed)
Rx sent to pharmacy.  Mackie Pai, PA-C

## 2022-06-10 ENCOUNTER — Ambulatory Visit (INDEPENDENT_AMBULATORY_CARE_PROVIDER_SITE_OTHER): Payer: No Typology Code available for payment source | Admitting: *Deleted

## 2022-06-10 DIAGNOSIS — E538 Deficiency of other specified B group vitamins: Secondary | ICD-10-CM

## 2022-06-10 MED ORDER — CYANOCOBALAMIN 1000 MCG/ML IJ SOLN
1000.0000 ug | Freq: Once | INTRAMUSCULAR | Status: AC
  Administered 2022-06-10: 1000 ug via INTRAMUSCULAR

## 2022-06-10 NOTE — Progress Notes (Addendum)
Pt here for monthly B12 injection per PCP.  B12 1075mg given IM right deltoid, and pt tolerated injection well.  Next B12 injection scheduled for 07/13/22.  EMackie Pai PA-C

## 2022-06-30 ENCOUNTER — Telehealth: Payer: Self-pay | Admitting: Medical

## 2022-06-30 NOTE — Telephone Encounter (Signed)
Patient states her weight medication dosage has been on backorder and according to her pharmacy she could her a higher dose and only use a fraction of it. She would like a call back to discuss.

## 2022-07-01 NOTE — Telephone Encounter (Signed)
Pt called and lvm to return call and schedule an appt

## 2022-07-01 NOTE — Telephone Encounter (Signed)
Pt scheduled for VV.

## 2022-07-05 ENCOUNTER — Encounter: Payer: Self-pay | Admitting: *Deleted

## 2022-07-05 ENCOUNTER — Other Ambulatory Visit (HOSPITAL_BASED_OUTPATIENT_CLINIC_OR_DEPARTMENT_OTHER): Payer: Self-pay

## 2022-07-05 ENCOUNTER — Encounter: Payer: Self-pay | Admitting: Family

## 2022-07-05 ENCOUNTER — Telehealth (INDEPENDENT_AMBULATORY_CARE_PROVIDER_SITE_OTHER): Payer: No Typology Code available for payment source | Admitting: Medical

## 2022-07-05 MED ORDER — SEMAGLUTIDE-WEIGHT MANAGEMENT 0.25 MG/0.5ML ~~LOC~~ SOAJ
0.2500 mg | SUBCUTANEOUS | 0 refills | Status: DC
  Filled 2022-07-05: qty 2, 28d supply, fill #0

## 2022-07-05 MED ORDER — TIRZEPATIDE-WEIGHT MANAGEMENT 2.5 MG/0.5ML ~~LOC~~ SOAJ
2.5000 mg | SUBCUTANEOUS | 0 refills | Status: DC
  Filled 2022-07-05: qty 2, 28d supply, fill #0

## 2022-07-05 MED ORDER — ZEPBOUND 5 MG/0.5ML ~~LOC~~ SOAJ
5.0000 mg | SUBCUTANEOUS | 0 refills | Status: DC
  Filled 2022-07-05: qty 2, 28d supply, fill #0

## 2022-07-05 MED ORDER — ZEPBOUND 7.5 MG/0.5ML ~~LOC~~ SOAJ
7.5000 mg | SUBCUTANEOUS | 0 refills | Status: DC
  Filled 2022-07-05: qty 2, 28d supply, fill #0

## 2022-07-05 MED ORDER — ZEPBOUND 10 MG/0.5ML ~~LOC~~ SOAJ
10.0000 mg | SUBCUTANEOUS | 0 refills | Status: DC
  Filled 2022-07-05: qty 2, 28d supply, fill #0

## 2022-07-05 NOTE — Patient Instructions (Addendum)
For obesity and desired weight loss we discussed difficulty with wegovy supply. I talked with our pharmacy and zepbound weekly injection is other option. Sent next 4 month  prescriptions in. Rx advisement given in event of disruption due to supply issues..   Provided you get med filled would ask you update me on my chart update  in 2 months with amount of weight loss and if any side effects. Sooner if needed.

## 2022-07-05 NOTE — Progress Notes (Signed)
   Subjective:    Patient ID: Brandi Lutz, female    DOB: 12/07/1975, 47 y.o.   MRN: [id number]  HPI    Review of Systems Virtual Visit via Video Note  I connected with Amit A Kroboth on 07/05/22 at  1:00 PM EST by a video enabled telemedicine application and verified that I am speaking with the correct person using two identifiers.  Location: Patient: home Provider: office.   I discussed the limitations of evaluation and management by telemedicine and the availability of in person appointments. The patient expressed understanding and agreed to proceed.  History of Present Illness:   Pt states wegovy supply at her pharmacy is still not available at lower doses. The pharmacist states lower  dosing was on back order. She actually never got wegovy since was not available. Her pharmacy only had higher doses.  Our pharmacy at [facility] just ran out of lower dose wegovy as well.       Observations/Objective: General-no acute distress, pleasant, oriented. Lungs- on inspection lungs appear unlabored. Neck- no tracheal deviation or jvd on inspection. Neuro- gross motor function appears intact.   Assessment and Plan: Patient Instructions  For obesity and desired weight loss we discussed difficulty with wegovy supply. I talked with our pharmacy and zepbound weekly injection is other option. Sent next 4 month  prescriptions in. Rx advisement given in event of disruption due to supply issues..   Provided you get med filled would ask you update me on my chart update  in 2 months with amount of weight loss and if any side effects. Sooner if needed.   Mackie Pai, PA-C  Follow Up Instructions:    I discussed the assessment and treatment plan with the patient. The patient was provided an opportunity to ask questions and all were answered. The patient agreed with the plan and demonstrated an understanding of the instructions.   The patient was advised to call back or seek an  in-person evaluation if the symptoms worsen or if the condition fails to improve as anticipated.    Mackie Pai, PA-C     Objective:   Physical Exam        Assessment & Plan:

## 2022-07-13 ENCOUNTER — Ambulatory Visit (INDEPENDENT_AMBULATORY_CARE_PROVIDER_SITE_OTHER): Payer: No Typology Code available for payment source | Admitting: *Deleted

## 2022-07-13 ENCOUNTER — Other Ambulatory Visit (HOSPITAL_BASED_OUTPATIENT_CLINIC_OR_DEPARTMENT_OTHER): Payer: Self-pay

## 2022-07-13 ENCOUNTER — Encounter: Payer: Self-pay | Admitting: Family

## 2022-07-13 DIAGNOSIS — E538 Deficiency of other specified B group vitamins: Secondary | ICD-10-CM

## 2022-07-13 MED ORDER — CYANOCOBALAMIN 1000 MCG/ML IJ SOLN
1000.0000 ug | Freq: Once | INTRAMUSCULAR | Status: AC
  Administered 2022-07-13: 1000 ug via INTRAMUSCULAR

## 2022-07-13 NOTE — Progress Notes (Addendum)
Pt here for monthly B12 injection per PCP.   B12 108mg given IM left deltoid, and pt tolerated injection well.   Next B12 injection scheduled for 08/12/22.   EMackie Pai PA-C

## 2022-07-20 ENCOUNTER — Other Ambulatory Visit (HOSPITAL_BASED_OUTPATIENT_CLINIC_OR_DEPARTMENT_OTHER): Payer: Self-pay

## 2022-07-23 ENCOUNTER — Other Ambulatory Visit: Payer: Self-pay

## 2022-07-26 ENCOUNTER — Telehealth: Payer: Self-pay | Admitting: Medical

## 2022-07-26 MED ORDER — SEMAGLUTIDE-WEIGHT MANAGEMENT 0.5 MG/0.5ML ~~LOC~~ SOAJ
0.5000 mg | SUBCUTANEOUS | 0 refills | Status: AC
  Filled 2022-07-26 – 2022-08-06 (×3): qty 2, 28d supply, fill #0

## 2022-07-26 MED ORDER — SEMAGLUTIDE-WEIGHT MANAGEMENT 1.7 MG/0.75ML ~~LOC~~ SOAJ
1.7000 mg | SUBCUTANEOUS | 0 refills | Status: AC
  Filled 2022-07-26 – 2022-10-01 (×2): qty 3, 28d supply, fill #0

## 2022-07-26 MED ORDER — SEMAGLUTIDE-WEIGHT MANAGEMENT 0.25 MG/0.5ML ~~LOC~~ SOAJ
0.2500 mg | SUBCUTANEOUS | 0 refills | Status: AC
  Filled 2022-07-26: qty 2, 28d supply, fill #0

## 2022-07-26 MED ORDER — SEMAGLUTIDE-WEIGHT MANAGEMENT 2.4 MG/0.75ML ~~LOC~~ SOAJ
2.4000 mg | SUBCUTANEOUS | 0 refills | Status: DC
  Filled 2022-07-26 – 2022-11-05 (×2): qty 3, 28d supply, fill #0

## 2022-07-26 MED ORDER — SEMAGLUTIDE-WEIGHT MANAGEMENT 1 MG/0.5ML ~~LOC~~ SOAJ
1.0000 mg | SUBCUTANEOUS | 0 refills | Status: AC
  Filled 2022-07-26 – 2022-09-03 (×2): qty 2, 28d supply, fill #0

## 2022-07-26 NOTE — Addendum Note (Signed)
Addended by: Anabel Halon on: 07/26/2022 08:24 PM   Modules accepted: Orders

## 2022-07-26 NOTE — Telephone Encounter (Signed)
Pt states ins only covers wegovy, so she needs ZEPBOUND switched and all the increases changed as well. She would like to pick this up at the pharmacy downstairs.

## 2022-07-27 ENCOUNTER — Other Ambulatory Visit (HOSPITAL_BASED_OUTPATIENT_CLINIC_OR_DEPARTMENT_OTHER): Payer: Self-pay

## 2022-07-27 ENCOUNTER — Other Ambulatory Visit: Payer: Self-pay

## 2022-08-06 ENCOUNTER — Encounter: Payer: Self-pay | Admitting: Family

## 2022-08-06 ENCOUNTER — Other Ambulatory Visit (HOSPITAL_BASED_OUTPATIENT_CLINIC_OR_DEPARTMENT_OTHER): Payer: Self-pay

## 2022-08-12 ENCOUNTER — Ambulatory Visit (INDEPENDENT_AMBULATORY_CARE_PROVIDER_SITE_OTHER): Payer: No Typology Code available for payment source

## 2022-08-12 DIAGNOSIS — E538 Deficiency of other specified B group vitamins: Secondary | ICD-10-CM

## 2022-08-12 MED ORDER — CYANOCOBALAMIN 1000 MCG/ML IJ SOLN
1000.0000 ug | Freq: Once | INTRAMUSCULAR | Status: AC
  Administered 2022-08-12: 1000 ug via INTRAMUSCULAR

## 2022-08-12 NOTE — Progress Notes (Signed)
Patient here today for 3/5 monthly b12 injections per [facility]  B12 1036mg given IM, Left deltoid and pt tolerated injection well.  Next B12 injection scheduled for next month

## 2022-08-20 ENCOUNTER — Inpatient Hospital Stay: Payer: No Typology Code available for payment source | Attending: Hematology & Oncology

## 2022-08-20 ENCOUNTER — Encounter: Payer: Self-pay | Admitting: Oncology

## 2022-08-20 ENCOUNTER — Other Ambulatory Visit: Payer: Self-pay

## 2022-08-20 ENCOUNTER — Inpatient Hospital Stay (HOSPITAL_BASED_OUTPATIENT_CLINIC_OR_DEPARTMENT_OTHER): Payer: No Typology Code available for payment source | Admitting: Oncology

## 2022-08-20 DIAGNOSIS — D510 Vitamin B12 deficiency anemia due to intrinsic factor deficiency: Secondary | ICD-10-CM | POA: Insufficient documentation

## 2022-08-20 DIAGNOSIS — D508 Other iron deficiency anemias: Secondary | ICD-10-CM | POA: Insufficient documentation

## 2022-08-20 DIAGNOSIS — Z9884 Bariatric surgery status: Secondary | ICD-10-CM | POA: Insufficient documentation

## 2022-08-20 DIAGNOSIS — K909 Intestinal malabsorption, unspecified: Secondary | ICD-10-CM | POA: Diagnosis not present

## 2022-08-20 LAB — CBC WITH DIFFERENTIAL (CANCER CENTER ONLY)
Abs Immature Granulocytes: 0.04 10*3/uL (ref 0.00–0.07)
Basophils Absolute: 0 10*3/uL (ref 0.0–0.1)
Basophils Relative: 1 %
Eosinophils Absolute: 0.1 10*3/uL (ref 0.0–0.5)
Eosinophils Relative: 2 %
HCT: 42.3 % (ref 36.0–46.0)
Hemoglobin: 13.7 g/dL (ref 12.0–15.0)
Immature Granulocytes: 1 %
Lymphocytes Relative: 32 %
Lymphs Abs: 1.8 10*3/uL (ref 0.7–4.0)
MCH: 27 pg (ref 26.0–34.0)
MCHC: 32.4 g/dL (ref 30.0–36.0)
MCV: 83.4 fL (ref 80.0–100.0)
Monocytes Absolute: 0.5 10*3/uL (ref 0.1–1.0)
Monocytes Relative: 9 %
Neutro Abs: 3.1 10*3/uL (ref 1.7–7.7)
Neutrophils Relative %: 55 %
Platelet Count: 289 10*3/uL (ref 150–400)
RBC: 5.07 MIL/uL (ref 3.87–5.11)
RDW: 12.5 % (ref 11.5–15.5)
WBC Count: 5.6 10*3/uL (ref 4.0–10.5)
nRBC: 0 % (ref 0.0–0.2)

## 2022-08-20 LAB — RETICULOCYTES
Immature Retic Fract: 7.5 % (ref 2.3–15.9)
RBC.: 5.09 MIL/uL (ref 3.87–5.11)
Retic Count, Absolute: 62.6 10*3/uL (ref 19.0–186.0)
Retic Ct Pct: 1.2 % (ref 0.4–3.1)

## 2022-08-20 LAB — IRON AND IRON BINDING CAPACITY (CC-WL,HP ONLY)
Iron: 91 ug/dL (ref 28–170)
Saturation Ratios: 25 % (ref 10.4–31.8)
TIBC: 363 ug/dL (ref 250–450)
UIBC: 272 ug/dL (ref 148–442)

## 2022-08-20 LAB — FERRITIN: Ferritin: 53 ng/mL (ref 11–307)

## 2022-08-20 NOTE — Progress Notes (Signed)
Hematology and Oncology Follow Up Visit  Brandi Lutz [id number] [birth year] 47 y.o. 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 Brandi Lutz is doing well but does note some increase in her fatigue level.  He continues to work full-time. Brandi Lutz has not noted any obvious blood loss. No bruising or petechiae.  No fever, chills, n/v, cough, rash, dizziness, SOB, chest pain, palpitations, abdominal pain or changes in bowel or bladder habits.  No swelling, tenderness, numbness or tingling in her extremities at this time.  No falls or syncope reported.  Appetite and hydration have been good.  Brandi Lutz is slowly losing weight.  Started on [place] recently.  ECOG Performance Status: 1 - Symptomatic but completely ambulatory  Medications:  Allergies as of 08/20/2022       Reactions   Tape Rash        Medication List        Accurate as of August 20, 2022  1:47 PM. If you have any questions, ask your nurse or doctor.          ALPRAZolam 0.5 MG tablet Commonly known as: XANAX 1/2-1 TAB TWICE A DAY AS NEEDED FOR ANXIETY   buPROPion 150 MG 24 hr tablet Commonly known as: WELLBUTRIN XL Take 150 mg by mouth daily.   Semaglutide-Weight Management 0.25 MG/0.5ML Soaj Inject 0.25 mg into the skin once a week for 28 days.   Wegovy 0.5 MG/0.5ML Soaj Generic drug: Semaglutide-Weight Management Inject 0.5 mg into the skin once a week for 28 days. Start taking on: August 24, 2022   Semaglutide-Weight Management 1 MG/0.5ML Soaj Inject 1 mg into the skin once a week for 28 days. Start taking on: September 22, 2022   Semaglutide-Weight Management 1.7 MG/0.75ML Soaj Inject 1.7 mg into the skin once a week for 28 days. Start taking on: Oct 21, 2022   Semaglutide-Weight  Management 2.4 MG/0.75ML Soaj Inject 2.4 mg into the skin once a week for 28 days. Start taking on: November 19, 2022   sertraline 50 MG tablet Commonly known as: ZOLOFT Take 50 mg by mouth daily.        Allergies:  Allergies  Allergen Reactions   Tape Rash    Past Medical History, Surgical history, Social history, and Family History were reviewed and updated.  Review of Systems: All other 10 point review of systems is negative.   Physical Exam:  vitals were not taken for this visit.   Wt Readings from Last 3 Encounters:  02/19/22 109.4 kg  01/27/22 111.1 kg  08/19/21 101.5 kg    Ocular: Sclerae unicteric, pupils equal, round and reactive to light Ear-nose-throat: Oropharynx clear, dentition fair Lymphatic: No cervical or supraclavicular adenopathy Lungs no rales or rhonchi, good excursion bilaterally Heart regular rate and rhythm, no murmur appreciated Abd soft, nontender, positive bowel sounds MSK no focal spinal tenderness, no joint edema Neuro: non-focal, well-oriented, appropriate affect Breasts: Deferred   Lab Results  Component Value Date   WBC 5.6 08/20/2022   HGB 13.7 08/20/2022   HCT 42.3 08/20/2022   MCV 83.4 08/20/2022   PLT 289 08/20/2022   Lab Results  Component Value Date   FERRITIN 9 (L) 02/19/2022   IRON 61 02/19/2022   TIBC 412 02/19/2022  UIBC 351 02/19/2022   IRONPCTSAT 15 02/19/2022   Lab Results  Component Value Date   RETICCTPCT 1.2 08/20/2022   RBC 5.07 08/20/2022   RBC 5.09 08/20/2022   RETICCTABS 31.3 05/02/2015   No results found for: "KPAFRELGTCHN", "LAMBDASER", "KAPLAMBRATIO" No results found for: "IGGSERUM", "IGA", "IGMSERUM" No results found for: "TOTALPROTELP", "ALBUMINELP", "A1GS", "A2GS", "BETS", "BETA2SER", "GAMS", "MSPIKE", "SPEI"   Chemistry      Component Value Date/Time   NA 141 04/16/2022 1529   K 4.1 04/16/2022 1529   CL 105 04/16/2022 1529   CO2 26 04/16/2022 1529   BUN 8 04/16/2022 1529   CREATININE  0.89 04/16/2022 1529   CREATININE 0.93 09/16/2020 1343      Component Value Date/Time   CALCIUM 9.1 04/16/2022 1529   ALKPHOS 83 04/16/2022 1529   AST 31 04/16/2022 1529   AST 43 (H) 09/16/2020 1343   ALT 41 (H) 04/16/2022 1529   ALT 70 (H) 09/16/2020 1343   BILITOT 0.3 04/16/2022 1529   BILITOT 0.4 09/16/2020 1343       Impression and Plan: Brandi Lutz is a very pleasant 47 yo African African American female with iron deficiency anemia secondary to malabsorption after gastric bypass.  Iron studies are pending.  Follow-up in 6 months.   Mikey Bussing, NP 3/8/20241:47 PM

## 2022-08-26 ENCOUNTER — Other Ambulatory Visit: Payer: Self-pay | Admitting: Medical

## 2022-08-26 NOTE — Telephone Encounter (Signed)
Requesting: alprazolam 0.'5mg'$   Contract: 05/05/22 UDS: 05/05/22 Last Visit: 07/05/22 Next Visit: None Last Refill: 06/03/22 #24 and 2RF   Please Advise

## 2022-08-26 NOTE — Telephone Encounter (Signed)
Rx refill sent.  Christain Mcraney, PA-C  

## 2022-09-03 ENCOUNTER — Other Ambulatory Visit (HOSPITAL_BASED_OUTPATIENT_CLINIC_OR_DEPARTMENT_OTHER): Payer: Self-pay

## 2022-09-09 ENCOUNTER — Ambulatory Visit: Payer: No Typology Code available for payment source

## 2022-09-09 DIAGNOSIS — E538 Deficiency of other specified B group vitamins: Secondary | ICD-10-CM

## 2022-09-09 MED ORDER — CYANOCOBALAMIN 1000 MCG/ML IJ SOLN
1000.0000 ug | Freq: Once | INTRAMUSCULAR | Status: AC
  Administered 2022-09-09: 1000 ug via INTRAMUSCULAR

## 2022-09-09 NOTE — Progress Notes (Signed)
Pt here for 4/5 monthly B12 injection per PCP  B12 1063mcg given IM right deltoid, and pt tolerated injection well.  Next B12 injection scheduled for 10/07/2022

## 2022-10-01 ENCOUNTER — Other Ambulatory Visit (HOSPITAL_BASED_OUTPATIENT_CLINIC_OR_DEPARTMENT_OTHER): Payer: Self-pay

## 2022-10-01 ENCOUNTER — Encounter: Payer: Self-pay | Admitting: Family

## 2022-10-07 ENCOUNTER — Ambulatory Visit: Payer: No Typology Code available for payment source

## 2022-10-07 DIAGNOSIS — E538 Deficiency of other specified B group vitamins: Secondary | ICD-10-CM | POA: Diagnosis not present

## 2022-10-07 MED ORDER — CYANOCOBALAMIN 1000 MCG/ML IJ SOLN
1000.0000 ug | Freq: Once | INTRAMUSCULAR | Status: AC
  Administered 2022-10-07: 1000 ug via INTRAMUSCULAR

## 2022-10-07 NOTE — Progress Notes (Signed)
Pt here for 5/5 monthly B12 injection per Esperanza Richters  B12 given IM left deltoid, and pt tolerated injection well.  Pt scheduled for lab follow up at end of May.

## 2022-10-27 ENCOUNTER — Other Ambulatory Visit (INDEPENDENT_AMBULATORY_CARE_PROVIDER_SITE_OTHER): Payer: No Typology Code available for payment source

## 2022-10-27 DIAGNOSIS — E538 Deficiency of other specified B group vitamins: Secondary | ICD-10-CM | POA: Diagnosis not present

## 2022-10-28 LAB — B12 AND FOLATE PANEL
Folate: 10 ng/mL (ref >5.9)
Vitamin B-12: 250 pg/mL (ref 211–911)

## 2022-10-30 ENCOUNTER — Encounter: Payer: Self-pay | Admitting: Medical

## 2022-11-05 ENCOUNTER — Other Ambulatory Visit (HOSPITAL_BASED_OUTPATIENT_CLINIC_OR_DEPARTMENT_OTHER): Payer: Self-pay

## 2022-11-05 ENCOUNTER — Other Ambulatory Visit: Payer: Self-pay

## 2022-11-05 DIAGNOSIS — D508 Other iron deficiency anemias: Secondary | ICD-10-CM

## 2022-11-10 ENCOUNTER — Ambulatory Visit (INDEPENDENT_AMBULATORY_CARE_PROVIDER_SITE_OTHER): Payer: No Typology Code available for payment source | Admitting: Neurology

## 2022-11-10 DIAGNOSIS — E538 Deficiency of other specified B group vitamins: Secondary | ICD-10-CM | POA: Diagnosis not present

## 2022-11-10 MED ORDER — CYANOCOBALAMIN 1000 MCG/ML IJ SOLN
1000.0000 ug | Freq: Once | INTRAMUSCULAR | Status: AC
Start: 2022-11-10 — End: 2022-11-10
  Administered 2022-11-10: 1000 ug via INTRAMUSCULAR

## 2022-11-10 NOTE — Progress Notes (Signed)
Pt here for monthly B12 injection per Dr. Alvira Monday.   Esperanza Richters, PA-C 10/30/2022 10:07 AM EDT     Your b12 level is low still. Recommend getting set up for weekly b12 vitamin injection over next 4 weeks followed by monthly injection for 5 months    B12 given to right deltoid IM, and pt tolerated injection well.  Next B12 injection scheduled for 11/18/2022.

## 2022-11-16 ENCOUNTER — Inpatient Hospital Stay: Payer: No Typology Code available for payment source | Attending: Hematology & Oncology

## 2022-11-16 DIAGNOSIS — Z9884 Bariatric surgery status: Secondary | ICD-10-CM | POA: Diagnosis not present

## 2022-11-16 DIAGNOSIS — D508 Other iron deficiency anemias: Secondary | ICD-10-CM | POA: Diagnosis present

## 2022-11-16 DIAGNOSIS — D510 Vitamin B12 deficiency anemia due to intrinsic factor deficiency: Secondary | ICD-10-CM | POA: Insufficient documentation

## 2022-11-16 DIAGNOSIS — K909 Intestinal malabsorption, unspecified: Secondary | ICD-10-CM | POA: Insufficient documentation

## 2022-11-16 LAB — CBC WITH DIFFERENTIAL (CANCER CENTER ONLY)
Abs Immature Granulocytes: 0 10*3/uL (ref 0.00–0.07)
Basophils Absolute: 0 10*3/uL (ref 0.0–0.1)
Basophils Relative: 1 %
Eosinophils Absolute: 0.1 10*3/uL (ref 0.0–0.5)
Eosinophils Relative: 2 %
HCT: 41.2 % (ref 36.0–46.0)
Hemoglobin: 13.5 g/dL (ref 12.0–15.0)
Immature Granulocytes: 0 %
Lymphocytes Relative: 38 %
Lymphs Abs: 1.6 10*3/uL (ref 0.7–4.0)
MCH: 27.2 pg (ref 26.0–34.0)
MCHC: 32.8 g/dL (ref 30.0–36.0)
MCV: 82.9 fL (ref 80.0–100.0)
Monocytes Absolute: 0.4 10*3/uL (ref 0.1–1.0)
Monocytes Relative: 9 %
Neutro Abs: 2.1 10*3/uL (ref 1.7–7.7)
Neutrophils Relative %: 50 %
Platelet Count: 322 10*3/uL (ref 150–400)
RBC: 4.97 MIL/uL (ref 3.87–5.11)
RDW: 12.8 % (ref 11.5–15.5)
WBC Count: 4.2 10*3/uL (ref 4.0–10.5)
nRBC: 0 % (ref 0.0–0.2)

## 2022-11-16 LAB — FERRITIN: Ferritin: 32 ng/mL (ref 11–307)

## 2022-11-17 LAB — IRON AND IRON BINDING CAPACITY (CC-WL,HP ONLY)
Iron: 91 ug/dL (ref 28–170)
Saturation Ratios: 24 % (ref 10.4–31.8)
TIBC: 377 ug/dL (ref 250–450)
UIBC: 286 ug/dL (ref 148–442)

## 2022-11-18 ENCOUNTER — Ambulatory Visit: Payer: No Typology Code available for payment source

## 2022-11-25 ENCOUNTER — Ambulatory Visit: Payer: No Typology Code available for payment source

## 2022-11-25 DIAGNOSIS — E538 Deficiency of other specified B group vitamins: Secondary | ICD-10-CM

## 2022-11-25 MED ORDER — CYANOCOBALAMIN 1000 MCG/ML IJ SOLN
1000.0000 ug | Freq: Once | INTRAMUSCULAR | Status: AC
Start: 2022-11-25 — End: 2022-11-25
  Administered 2022-11-25: 1000 ug via INTRAMUSCULAR

## 2022-11-25 NOTE — Progress Notes (Signed)
Pt here today for 2 of 4 B12 injection per Ramon Dredge.  Cyanocobalamin injected into L deltoid IM. Pt tolerated injection well.    Next in 1 week.

## 2022-12-01 NOTE — Progress Notes (Unsigned)
Office Visit Note   Patient: Brandi Lutz           Date of Birth: January 03, 1976           MRN: [id number] Visit Date: 12/02/2022              Requested by: Esperanza Richters, PA-C [id number] [place],  [place] [address] PCP: Esperanza Richters, PA-C   Assessment & Plan: Visit Diagnoses:  1. Pain in right hip     Plan: Impression is symptomatic early right hip osteoarthritis.  Symptoms are still fairly intermittent.  I will send her to formal outpatient PT and give her prescription for tramadol for breakthrough pain.  She cannot take NSAIDs due to history of gastric bypass.  She will take Tylenol as needed.  Follow-up as needed.  Follow-Up Instructions: No follow-ups on file.   Orders:  No orders of the defined types were placed in this encounter.  No orders of the defined types were placed in this encounter.     Procedures: No procedures performed   Clinical Data: No additional findings.   Subjective: No chief complaint on file.   HPI Brandi Lutz is a 47 year old female who is well-known to me comes in for evaluation of 6 months of right hip pain.  Denies groin pain but reports deep-seated hip pain that is worse with laying down on right side.  The pain is constant and is present during ADLs.  She has been very happy with her knee replacement. Review of Systems  Constitutional: Negative.   HENT: Negative.    Eyes: Negative.   Respiratory: Negative.    Cardiovascular: Negative.   Endocrine: Negative.   Musculoskeletal: Negative.   Neurological: Negative.   Hematological: Negative.   Psychiatric/Behavioral: Negative.    All other systems reviewed and are negative.   Objective: Vital Signs: There were no vitals taken for this visit.  Physical Exam Vitals and nursing note reviewed.  Constitutional:      Appearance: [name] well-developed.  HENT:     Head: Atraumatic.     Nose: Nose normal.  Eyes:     Extraocular Movements: Extraocular movements  intact.  Cardiovascular:     Pulses: Normal pulses.  Pulmonary:     Effort: Pulmonary effort is normal.  Abdominal:     Palpations: Abdomen is soft.  Musculoskeletal:     Cervical back: Neck supple.  Skin:    General: Skin is warm.     Capillary Refill: Capillary refill takes less than 2 seconds.  Neurological:     Mental Status: [name] alert. Mental status is at baseline.  Psychiatric:        Behavior: Behavior normal.        Thought Content: Thought content normal.        Judgment: Judgment normal.   Ortho Exam Examination right hip shows preserved range of motion without pain.  She has no tenderness to the trochanteric region.  Negative Ober.  She has pain deep inside the hip joint with Stinchfield maneuver. Specialty Comments:  No specialty comments available.  Imaging: No results found.   PMFS History: Patient Active Problem List   Diagnosis Date Noted   Status post total left knee replacement 01/29/2019   Primary osteoarthritis of left knee 01/02/2019   IDA (iron deficiency anemia) 03/18/2016   Malabsorption of iron 03/18/2016   Chronic diarrhea 09/11/2015   Family history of colon cancer 09/11/2015   History of colon polyps 09/11/2015  Status post left partial knee replacement 08/05/2014   Other iron deficiency anemias 02/05/2013   Gastric bypass status for obesity 02/05/2013   Past Medical History:  Diagnosis Date   Anxiety    Arthritis    Cancer (HCC)    colon   Depression    takes Wellbutrin and Zoloft daily   Diabetes mellitus without complication (HCC)    diet and gastric surgery controlled   Gastric bypass status for obesity 02/05/2013   Headache    History of colon polyps    benign   History of migraine    none since high school   Joint pain    Joint swelling    Other specified iron deficiency anemias 02/05/2013   2 iron infusions since gastric bypass;no abnormal reaction noted   PONV (postoperative nausea and vomiting)    S/P left  unicompartmental knee replacement 08/05/2014    No family history on file.  Past Surgical History:  Procedure Laterality Date   APPENDECTOMY     ARTHROSCOPIC REPAIR ACL Left 20 yrs ago   CHOLECYSTECTOMY     COLECTOMY     COLONOSCOPY     GASTRIC BYPASS     herniated bowel repair     KNEE ARTHROSCOPY Left    PARTIAL KNEE ARTHROPLASTY Left 08/05/2014   Procedure: LEFT UNICOMPARTMENTAL KNEE;  Surgeon: Cheral Almas, MD;  Location: [facility] OR;  Service: Orthopedics;  Laterality: Left;   TOTAL KNEE ARTHROPLASTY Left 01/29/2019   TOTAL KNEE REVISION Left 01/29/2019   Procedure: CONVERSION OF LEFT UNICOMPARTMENTAL KNEE ARTHROPLASTY TO LEFT TOTAL KNEE ARTHROPLASTY;  Surgeon: Tarry Kos, MD;  Location: [facility] OR;  Service: Orthopedics;  Laterality: Left;   Social History   Occupational History   Not on file  Tobacco Use   Smoking status: Never   Smokeless tobacco: Never  Vaping Use   Vaping Use: Never used  Substance and Sexual Activity   Alcohol use: Yes    Alcohol/week: 0.0 standard drinks of alcohol    Comment: rarely   Drug use: No   Sexual activity: Not Currently

## 2022-12-02 ENCOUNTER — Ambulatory Visit: Payer: No Typology Code available for payment source

## 2022-12-02 ENCOUNTER — Ambulatory Visit (INDEPENDENT_AMBULATORY_CARE_PROVIDER_SITE_OTHER): Payer: No Typology Code available for payment source

## 2022-12-02 ENCOUNTER — Ambulatory Visit: Payer: No Typology Code available for payment source | Admitting: Orthopaedic Surgery

## 2022-12-02 ENCOUNTER — Other Ambulatory Visit: Payer: Self-pay | Admitting: Medical

## 2022-12-02 DIAGNOSIS — E538 Deficiency of other specified B group vitamins: Secondary | ICD-10-CM | POA: Diagnosis not present

## 2022-12-02 DIAGNOSIS — M25551 Pain in right hip: Secondary | ICD-10-CM | POA: Diagnosis not present

## 2022-12-02 MED ORDER — CYANOCOBALAMIN 1000 MCG/ML IJ SOLN
1000.0000 ug | Freq: Once | INTRAMUSCULAR | Status: AC
Start: 2022-12-02 — End: 2022-12-02
  Administered 2022-12-02: 1000 ug via INTRAMUSCULAR

## 2022-12-02 MED ORDER — TRAMADOL HCL 50 MG PO TABS: 50.0000 mg | ORAL_TABLET | Freq: Every day | ORAL | 0 refills | Status: AC | PRN

## 2022-12-02 NOTE — Progress Notes (Signed)
Pt here for weekly B12 injection per Edward Saguier,PA-C  B12 1000mcg given IM, and pt tolerated injection well.  Next B12 injection scheduled for next week 

## 2022-12-02 NOTE — Progress Notes (Deleted)
Patient here for monthly b12 injection per physicians order.  Injection given in left deltoid and patient tolerated well.  

## 2022-12-03 ENCOUNTER — Other Ambulatory Visit: Payer: Self-pay | Admitting: Medical

## 2022-12-03 ENCOUNTER — Other Ambulatory Visit (HOSPITAL_BASED_OUTPATIENT_CLINIC_OR_DEPARTMENT_OTHER): Payer: Self-pay

## 2022-12-03 MED ORDER — WEGOVY 2.4 MG/0.75ML ~~LOC~~ SOAJ
2.4000 mg | SUBCUTANEOUS | 0 refills | Status: DC
  Filled 2022-12-03: qty 3, 28d supply, fill #0

## 2022-12-03 NOTE — Telephone Encounter (Signed)
Requesting: xanax Contract:05/17/22 UDS:05/17/22 Last Visit:07/05/22 Next Visit:n/a Last Refill:08/26/22  Please Advise

## 2022-12-04 NOTE — Telephone Encounter (Signed)
Refill sent. Pt need controlled visit within one month.

## 2022-12-09 ENCOUNTER — Ambulatory Visit (INDEPENDENT_AMBULATORY_CARE_PROVIDER_SITE_OTHER): Payer: No Typology Code available for payment source | Admitting: *Deleted

## 2022-12-09 DIAGNOSIS — E538 Deficiency of other specified B group vitamins: Secondary | ICD-10-CM | POA: Diagnosis not present

## 2022-12-09 MED ORDER — CYANOCOBALAMIN 1000 MCG/ML IJ SOLN
1000.0000 ug | Freq: Once | INTRAMUSCULAR | Status: AC
Start: 2022-12-09 — End: 2022-12-09
  Administered 2022-12-09: 1000 ug via INTRAMUSCULAR

## 2022-12-09 NOTE — Progress Notes (Signed)
Patient here for last weekly b12 injection per physicians order.  Injection given in left deltoid and patient tolerated well.

## 2022-12-14 ENCOUNTER — Other Ambulatory Visit: Payer: Self-pay

## 2022-12-14 ENCOUNTER — Ambulatory Visit: Payer: No Typology Code available for payment source | Attending: Orthopaedic Surgery

## 2022-12-14 DIAGNOSIS — M25551 Pain in right hip: Secondary | ICD-10-CM | POA: Insufficient documentation

## 2022-12-14 NOTE — Therapy (Signed)
OUTPATIENT PHYSICAL THERAPY LOWER EXTREMITY EVALUATION   Patient Name: Brandi Lutz MRN: [id number] DOB:16-Dec-1975, 47 y.o., female Today's Date: 12/14/2022  END OF SESSION:  PT End of Session - 12/14/22 1538     Visit Number 1    Date for PT Re-Evaluation 02/08/23    Progress Note Due on Visit 10    PT Start Time 1535    PT Stop Time 1615    PT Time Calculation (min) 40 min    Activity Tolerance Patient tolerated treatment well    Behavior During Therapy WFL for tasks assessed/performed             Past Medical History:  Diagnosis Date   Anxiety    Arthritis    Cancer (HCC)    colon   Depression    takes Wellbutrin and Zoloft daily   Diabetes mellitus without complication (HCC)    diet and gastric surgery controlled   Gastric bypass status for obesity 02/05/2013   Headache    History of colon polyps    benign   History of migraine    none since high school   Joint pain    Joint swelling    Other specified iron deficiency anemias 02/05/2013   2 iron infusions since gastric bypass;no abnormal reaction noted   PONV (postoperative nausea and vomiting)    S/P left unicompartmental knee replacement 08/05/2014   Past Surgical History:  Procedure Laterality Date   APPENDECTOMY     ARTHROSCOPIC REPAIR ACL Left 20 yrs ago   CHOLECYSTECTOMY     COLECTOMY     COLONOSCOPY     GASTRIC BYPASS     herniated bowel repair     KNEE ARTHROSCOPY Left    PARTIAL KNEE ARTHROPLASTY Left 08/05/2014   Procedure: LEFT UNICOMPARTMENTAL KNEE;  Surgeon: Cheral Almas, MD;  Location: [facility] OR;  Service: Orthopedics;  Laterality: Left;   TOTAL KNEE ARTHROPLASTY Left 01/29/2019   TOTAL KNEE REVISION Left 01/29/2019   Procedure: CONVERSION OF LEFT UNICOMPARTMENTAL KNEE ARTHROPLASTY TO LEFT TOTAL KNEE ARTHROPLASTY;  Surgeon: Tarry Kos, MD;  Location: [facility] OR;  Service: Orthopedics;  Laterality: Left;   Patient Active Problem List   Diagnosis Date Noted   Status post total left knee  replacement 01/29/2019   Primary osteoarthritis of left knee 01/02/2019   IDA (iron deficiency anemia) 03/18/2016   Malabsorption of iron 03/18/2016   Chronic diarrhea 09/11/2015   Family history of colon cancer 09/11/2015   History of colon polyps 09/11/2015   Status post left partial knee replacement 08/05/2014   Other iron deficiency anemias 02/05/2013   Gastric bypass status for obesity 02/05/2013    PCP: Esperanza Richters, PA  REFERRING PROVIDER: Gershon Mussel, Judie Petit, MD  REFERRING DIAG: R hip pain  THERAPY DIAG:  No diagnosis found.  Rationale for Evaluation and Treatment: Rehabilitation  ONSET DATE: 4 months  SUBJECTIVE:   SUBJECTIVE STATEMENT: Reports R hip progressive pain and stiffness, wants to be able to manage and prevent further decline  PERTINENT HISTORY: H/o L TKA 4 yrs ago.  R hip with progressive pain over last 3 to 4 months.  Referred to PT y her orthopedic surgeon PAIN:  Are you having pain? Yes: NPRS scale: 1/10 Pain location: R ant hip, lat groin Pain description: stiff , aching Aggravating factors: after prolonged sitting and lying down, worse after lying on L side Relieving factors: tylenol helps, improves with movement  PRECAUTIONS: None  WEIGHT BEARING RESTRICTIONS: No  FALLS:  Has patient fallen in last 6 months? 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 90 degrees knee flex deg; Left 100 deg knee flex without pain Fabers R p! And limited 15 degrees   POSTURE: No Significant postural limitations  PALPATION: Tender slighly R lateral hip over TFL  LOWER EXTREMITY ROM:  Passive ROM Right eval Left eval  Hip flexion Wfl but p!   Hip extension    Hip abduction -20   Hip adduction    Hip internal rotation 5 10  Hip external rotation 20   Knee flexion    Knee extension    Ankle dorsiflexion    Ankle plantarflexion    Ankle inversion    Ankle eversion     (Blank rows = not tested)  LOWER EXTREMITY MMT:  MMT Right eval Left eval  Hip flexion 4   Hip extension    Hip abduction    Hip adduction    Hip internal rotation    Hip external rotation 4   Knee flexion    Knee extension    Ankle dorsiflexion    Ankle plantarflexion    Ankle inversion    Ankle eversion     (Blank rows = wfl)  FUNCTIONAL TESTS:  6 minute walk test: walked 900  pain at 100', with compensatory antalgia, then improved   GAIT: Distance walked: 900' Assistive device utilized: None Level of assistance: Complete Independence  TODAY'S TREATMENT:                                                                                                                              DATE: 12/14/22 eval, instructed in prone R hip flexor/ ant hip stretch, as described below, also advised to initiate walking program to improve joint mobility R hip    PATIENT EDUCATION:  Education details: POC, goals Person educated: Patient Education method: Medical illustrator Education comprehension: verbalized understanding, returned demonstration, verbal cues required, tactile cues required, and needs further education  HOME EXERCISE PROGRAM: Access Code: [email or web address] URL: [email or web address] Date: 12/14/2022 Prepared by: Aunica Dauphinee  Exercises - Prone Quadriceps Stretch with Strap  - 1 x daily - 7 x weekly - 3 sets - 10  reps  ASSESSMENT:  CLINICAL IMPRESSION: Patient is a 47 y.o. female who was seen today for physical  therapy evaluation and treatment for R hip pain and stiffness due to OA.  She does demonstrate some limitations with R hip ROM and mobility, and strength deficits R hip flexors and ER.  She does have a job that requires her to sit all day, which most likely contributes to her stiffness.  Educated her in regard to the nature of osteoarthritis and the need for moderate movement to maintain jt nutrition and integrity.  We discussed low frequency, 1 x a week primarily to establish a long term stretching and strengthening program to manage her R hip and prevent further decline.  OBJECTIVE IMPAIRMENTS: decreased activity tolerance, decreased mobility, difficulty walking, decreased ROM, decreased strength, hypomobility, and pain.   ACTIVITY LIMITATIONS: bending, standing, squatting, sleeping, stairs, and locomotion level  PARTICIPATION LIMITATIONS: laundry, shopping, community activity, and yard work  PERSONAL FACTORS: Behavior pattern, Fitness, Past/current experiences, Profession, and 1 comorbidity: anemia, DM  are also affecting patient's functional outcome.   REHAB POTENTIAL: Good  CLINICAL DECISION MAKING: Stable/uncomplicated  EVALUATION COMPLEXITY: Low   GOALS: Goals reviewed with patient? Yes  SHORT TERM GOALS: Target date: 12/28/22 Established today , I HEP Baseline: Goal status: INITIAL   LONG TERM GOALS: Target date: 02/08/23, 8 weeks  Able to complete 6 min walk test with distance of 1000' or greater, Baseline: 6 min, 900' Goal status: INITIAL  2.  Improve strength R hip ER and flexion to 5/5/ with MMT for improved stability for stairs, gait, transfers Baseline: 4/5 Goal status: INITIAL  3.  Patient will be able to consistently participate in a walking program 4-5 days per week to assist with maintaining jt nutrition and mobility R hip Baseline: to be established Goal  status: INITIAL  4.  LEFS improve from 40% deficit to 30% or less Baseline:  Goal status: INITIAL    PLAN:  PT FREQUENCY: 1x/week  PT DURATION: 8 weeks  PLANNED INTERVENTIONS: Therapeutic exercises, Therapeutic activity, Neuromuscular re-education, Balance training, Gait training, Patient/Family education, Self Care, and Joint mobilization  PLAN FOR NEXT SESSION: reassess hip flexibility, possible try joint mobs, manual techniques, add more stretching, hip pendulum, adductor stretching, hip strengthening, with ball   Early Chars, PT, DPT Board-certified Specialist in Orthopaedic Physical Therapy   12/14/2022, 6:13 PM

## 2022-12-15 NOTE — Addendum Note (Signed)
Addended byMaxcine Ham, Beverely Suen L on: 12/15/2022 11:18 AM   Modules accepted: Orders

## 2022-12-27 ENCOUNTER — Other Ambulatory Visit: Payer: Self-pay | Admitting: Medical

## 2022-12-28 ENCOUNTER — Ambulatory Visit: Payer: No Typology Code available for payment source

## 2022-12-28 ENCOUNTER — Other Ambulatory Visit: Payer: Self-pay

## 2022-12-28 ENCOUNTER — Telehealth: Payer: Self-pay

## 2022-12-28 DIAGNOSIS — M25551 Pain in right hip: Secondary | ICD-10-CM | POA: Diagnosis not present

## 2022-12-28 NOTE — Therapy (Signed)
OUTPATIENT PHYSICAL THERAPY LOWER EXTREMITY TREATMENT   Patient Name: Brandi Lutz MRN: [id number] DOB:1976/04/15, 47 y.o., female Today's Date: 12/28/2022  END OF SESSION:  PT End of Session - 12/28/22 1703     Visit Number 2    Date for PT Re-Evaluation 02/08/23    Progress Note Due on Visit 10    PT Start Time 1619    PT Stop Time 1700    PT Time Calculation (min) 41 min    Activity Tolerance Patient tolerated treatment well    Behavior During Therapy [facility] for tasks assessed/performed              Past Medical History:  Diagnosis Date   Anxiety    Arthritis    Cancer (HCC)    colon   Depression    takes Wellbutrin and Zoloft daily   Diabetes mellitus without complication (HCC)    diet and gastric surgery controlled   Gastric bypass status for obesity 02/05/2013   Headache    History of colon polyps    benign   History of migraine    none since high school   Joint pain    Joint swelling    Other specified iron deficiency anemias 02/05/2013   2 iron infusions since gastric bypass;no abnormal reaction noted   PONV (postoperative nausea and vomiting)    S/P left unicompartmental knee replacement 08/05/2014   Past Surgical History:  Procedure Laterality Date   APPENDECTOMY     ARTHROSCOPIC REPAIR ACL Left 20 yrs ago   CHOLECYSTECTOMY     COLECTOMY     COLONOSCOPY     GASTRIC BYPASS     herniated bowel repair     KNEE ARTHROSCOPY Left    PARTIAL KNEE ARTHROPLASTY Left 08/05/2014   Procedure: LEFT UNICOMPARTMENTAL KNEE;  Surgeon: Cheral Almas, MD;  Location: [facility] OR;  Service: Orthopedics;  Laterality: Left;   TOTAL KNEE ARTHROPLASTY Left 01/29/2019   TOTAL KNEE REVISION Left 01/29/2019   Procedure: CONVERSION OF LEFT UNICOMPARTMENTAL KNEE ARTHROPLASTY TO LEFT TOTAL KNEE ARTHROPLASTY;  Surgeon: Tarry Kos, MD;  Location: [facility] OR;  Service: Orthopedics;  Laterality: Left;   Patient Active Problem List   Diagnosis Date Noted   Status post total left  knee replacement 01/29/2019   Primary osteoarthritis of left knee 01/02/2019   IDA (iron deficiency anemia) 03/18/2016   Malabsorption of iron 03/18/2016   Chronic diarrhea 09/11/2015   Family history of colon cancer 09/11/2015   History of colon polyps 09/11/2015   Status post left partial knee replacement 08/05/2014   Other iron deficiency anemias 02/05/2013   Gastric bypass status for obesity 02/05/2013    PCP: Esperanza Richters, PA  REFERRING PROVIDER: Tarry Kos, MD  REFERRING DIAG: R hip pain  THERAPY DIAG:  Pain in right hip  Rationale for Evaluation and Treatment: Rehabilitation  ONSET DATE: 4 months  SUBJECTIVE:   SUBJECTIVE STATEMENT: 12/28/22:  reports walking 2 x day, for about 10 min,reports R hip stiffness and pain have improved since initial evaluation  Eval:Reports R hip progressive pain and stiffness, wants to be able to manage and prevent further decline  PERTINENT HISTORY: H/o L TKA 4 yrs ago.  R hip with progressive pain over last 3 to 4 months.  Referred to PT y her orthopedic surgeon PAIN:  Are you having pain? Yes: NPRS scale: 1/10 Pain location: R ant hip, lat groin Pain description: stiff , aching Aggravating factors: after prolonged sitting and lying down,  worse after lying on L side Relieving factors: tylenol helps, improves with movement  PRECAUTIONS: None  WEIGHT BEARING RESTRICTIONS: No  FALLS:  Has patient fallen in last 6 months? No  LIVING ENVIRONMENT: Lives with: lives with an adult companion Lives in: House/apartment Stairs: Yes: Internal: 11 steps; on right going up Has following equipment at home: Walker - 4 wheeled  OCCUPATION: seated work on Animator all day   PLOF: Independent  PATIENT GOALS: manage R hip long term so it wont get worse  NEXT MD VISIT: none scheduled yet  OBJECTIVE:   DIAGNOSTIC FINDINGS: per Orthopedist OA , ,mild R hip  PATIENT SURVEYS:  LEFS 48/80: 60%  COGNITION: Overall cognitive status:  Within functional limits for tasks assessed     SENSATION: WFL   MUSCLE LENGTH: Hamstrings: Right wnl deg; Left wnl deg Prone knee flex test: Right p! 90 degrees knee flex deg; Left 100 deg knee flex without pain Fabers R p! And limited 15 degrees   POSTURE: No Significant postural limitations  PALPATION: Tender slighly R lateral hip over TFL  LOWER EXTREMITY ROM:  Passive ROM Right eval Left eval  Hip flexion Wfl but p!   Hip extension    Hip abduction -20   Hip adduction    Hip internal rotation 5 10  Hip external rotation 20   Knee flexion    Knee extension    Ankle dorsiflexion    Ankle plantarflexion    Ankle inversion    Ankle eversion     (Blank rows = not tested)  LOWER EXTREMITY MMT:  MMT Right eval Left eval  Hip flexion 4   Hip extension    Hip abduction    Hip adduction    Hip internal rotation    Hip external rotation 4   Knee flexion    Knee extension    Ankle dorsiflexion    Ankle plantarflexion    Ankle inversion    Ankle eversion     (Blank rows = wfl)  FUNCTIONAL TESTS:  6 minute walk test: walked 900  pain at 100', with compensatory antalgia, then improved   GAIT: Distance walked: 900' Assistive device utilized: None Level of assistance: Complete Independence  TODAY'S TREATMENT:                                                                                                                              DATE:12/28/22:  Reassessed R hip ROM for flexion and abduction, ER Abduction no longer painful, still limited R hip flexion motion, to approximately 100 degrees with R lateral hip/ groin pain at end range.  R hip ER wfl now but pain R groin lat hip at terminal range   Manual:  supine for R hip mobilization with movement , laterally and inferiorly gliding R femoral head in combination with R hip flexion stretch.  15 reps 3 bouts  Therex:  Instructed in self mobilization, using L fist for lateral glide  of R hip while performing R  hip/LE lunge.  Also instructed in standing R hip abduction stretch in combination with sustained lateral glide of R femoral head, using blue theraband and performing in standing.   12/14/22 eval, instructed in prone R hip flexor/ ant hip stretch, as described below, also advised to initiate walking program to improve joint mobility R hip    PATIENT EDUCATION:  Education details: POC, goals Person educated: Patient Education method: Medical illustrator Education comprehension: verbalized understanding, returned demonstration, verbal cues required, tactile cues required, and needs further education  HOME EXERCISE PROGRAM: Access Code: [email or web address] URL: [email or web address] Date: 12/14/2022 Prepared by: Tongela Encinas  Exercises - Prone Quadriceps Stretch with Strap  - 1 x daily - 7 x weekly - 3 sets - 10 reps  ASSESSMENT:  CLINICAL IMPRESSION: Patient is a 47 y.o. female who was seen today for physical therapy treatment for R hip pain and stiffness due to OA/RA.  Today she continued to demonstrate some limitations with R hip ROM particularly for flexion.  She responded well today to hip joint mobilizations, and therefore instructed her in some self mobilization techniques.  She also is trying to be more active, walking for mod exercise to maintain her cardiovascular health and jt health.  We plan to continue  1 x a week ,primarily to establish a long term stretching and strengthening program to manage her R hip and prevent further decline. Next week may benefit from additional strengthening progression for R hip flexion and ER.  OBJECTIVE IMPAIRMENTS: decreased activity tolerance, decreased mobility, difficulty walking, decreased ROM, decreased strength, hypomobility, and pain.   ACTIVITY LIMITATIONS: bending, standing, squatting, sleeping, stairs, and locomotion level  PARTICIPATION LIMITATIONS: laundry, shopping, community activity, and yard work  PERSONAL FACTORS:  Behavior pattern, Fitness, Past/current experiences, Profession, and 1 comorbidity: anemia, DM  are also affecting patient's functional outcome.   REHAB POTENTIAL: Good  CLINICAL DECISION MAKING: Stable/uncomplicated  EVALUATION COMPLEXITY: Low   GOALS: Goals reviewed with patient? Yes  SHORT TERM GOALS: Target date: 12/28/22 Established today , I HEP Baseline: Goal status: INITIAL   LONG TERM GOALS: Target date: 02/08/23, 8 weeks  Able to complete 6 min walk test with distance of 1000' or greater, Baseline: 6 min, 900' Goal status: INITIAL  2.  Improve strength R hip ER and flexion to 5/5/ with MMT for improved stability for stairs, gait, transfers Baseline: 4/5 Goal status: INITIAL  3.  Patient will be able to consistently participate in a walking program 4-5 days per week to assist with maintaining jt nutrition and mobility R hip Baseline: to be established Goal status: INITIAL  4.  LEFS improve from 40% deficit to 30% or less Baseline:  Goal status: INITIAL    PLAN:  PT FREQUENCY: 1x/week  PT DURATION: 8 weeks  PLANNED INTERVENTIONS: Therapeutic exercises, Therapeutic activity, Neuromuscular re-education, Balance training, Gait training, Patient/Family education, Self Care, and Joint mobilization  PLAN FOR NEXT SESSION: reassess hip flexibility, possible try joint mobs, manual techniques, add more stretching, hip pendulum, adductor stretching, hip strengthening, with ball   Early Chars, PT, DPT Board-certified Specialist in Orthopaedic Physical Therapy   12/28/2022, 5:18 PM

## 2022-12-28 NOTE — Telephone Encounter (Signed)
Appt scheduled

## 2022-12-28 NOTE — Telephone Encounter (Signed)
Needs in person OV for updated weight/and BMI for [facility] 2.4mg  PA please.

## 2023-01-03 ENCOUNTER — Ambulatory Visit: Payer: No Typology Code available for payment source | Admitting: Medical

## 2023-01-03 VITALS — BP 102/76 | HR 77 | Resp 18 | Ht 68.0 in | Wt 204.6 lb

## 2023-01-03 DIAGNOSIS — Z23 Encounter for immunization: Secondary | ICD-10-CM

## 2023-01-03 DIAGNOSIS — E538 Deficiency of other specified B group vitamins: Secondary | ICD-10-CM

## 2023-01-03 DIAGNOSIS — D649 Anemia, unspecified: Secondary | ICD-10-CM | POA: Diagnosis not present

## 2023-01-03 DIAGNOSIS — R1084 Generalized abdominal pain: Secondary | ICD-10-CM

## 2023-01-03 DIAGNOSIS — E669 Obesity, unspecified: Secondary | ICD-10-CM

## 2023-01-03 DIAGNOSIS — Z124 Encounter for screening for malignant neoplasm of cervix: Secondary | ICD-10-CM

## 2023-01-03 MED ORDER — CYANOCOBALAMIN 1000 MCG/ML IJ SOLN
1000.0000 ug | Freq: Once | INTRAMUSCULAR | Status: AC
Start: 2023-01-03 — End: 2023-01-03
  Administered 2023-01-03: 1000 ug via INTRAMUSCULAR

## 2023-01-03 MED ORDER — ALPRAZOLAM 0.5 MG PO TABS: ORAL_TABLET | ORAL | 0 refills | Status: DC

## 2023-01-03 NOTE — Telephone Encounter (Signed)
PA initiated via Covermymeds; KEY: [id number]. Awaiting determination.

## 2023-01-03 NOTE — Patient Instructions (Addendum)
Obesity: Significant weight loss (44 lbs) over 4 months on Wegovy. No significant side effects reported, except for one episode of vomiting due to overeating and occasional abdominal discomfort. -Continue Wegovy for weight loss.  Vitamin B12 deficiency: On B12 injections, last level was low. -Check B12 level today. -Consider adding oral B12 5000 mcg daily based on today's level.  Anxiety: Managed with Xanax, up to 24 pills per month. No illicit drug use reported. -Continue Xanax as needed for anxiety, up to 24 pills per month. -Plan to update contract and perform urine drug screen in November.   With one event vomiting and occasional abdomen will get lipase level.(Since on wegovy)   for low iron and anemia get cbc and iron panel.  General Health Maintenance: -Administer Tdap vaccine today. -Referral to gynecology for Pap smear.  Follow up date to be determined after lab review.

## 2023-01-03 NOTE — Addendum Note (Signed)
Addended by: Maximino Sarin on: 01/03/2023 02:08 PM   Modules accepted: Orders

## 2023-01-03 NOTE — Progress Notes (Signed)
Subjective:    Patient ID: Brandi Lutz, female    DOB: 1975-10-29, 47 y.o.   MRN: [id number]  HPI  Discussed the use of AI scribe software for clinical note transcription with the patient, who gave verbal consent to proceed.  History of Present Illness   The patient, on Wegovy for weight loss since February or March, reports a weight loss of approximately 44 pounds over the past four months. She describes the weight loss as progressive but not as rapid as others she has observed, which she is comfortable with. She experienced one episode of vomiting early in the treatment, which she attributes to overeating, and occasional abdominal discomfort that she describes as a 'tummy ache.' The last episode of abdominal discomfort occurred approximately three weeks ago.  The patient also reports a history of weight loss surgeries and is currently on a regimen of B12 injections due to persistently low levels. [name] due for a B12 level check today. She also has a history of iron deficiency, for which she has received infusions in the past, but recent checks have not indicated a need for further infusions.  In addition to the above, the patient is on a prescription of Xanax for work-related stress and anxiety. She typically takes half a pill and may take another later in the day if she still feels anxious. [name] currently nearing the end of her prescription and will need a refill soon. [name] under a contract for the Xanax prescription, which is due for renewal in November.  [name] also due for a Tdap vaccine.       Review of Systems  Constitutional:  Negative for chills, fatigue and fever.  Respiratory:  Negative for cough, chest tightness, shortness of breath and wheezing.   Cardiovascular:  Negative for chest pain and palpitations.  Gastrointestinal:  Negative for abdominal pain, anal bleeding, diarrhea and rectal pain.  Musculoskeletal:  Negative for back pain, joint swelling and neck  stiffness.  Skin:  Negative for rash.  Hematological:  Negative for adenopathy. Does not bruise/bleed easily.  Psychiatric/Behavioral:  Negative for behavioral problems, confusion and decreased concentration.     Past Medical History:  Diagnosis Date   Anxiety    Arthritis    Cancer (HCC)    colon   Depression    takes Wellbutrin and Zoloft daily   Diabetes mellitus without complication (HCC)    diet and gastric surgery controlled   Gastric bypass status for obesity 02/05/2013   Headache    History of colon polyps    benign   History of migraine    none since high school   Joint pain    Joint swelling    Other specified iron deficiency anemias 02/05/2013   2 iron infusions since gastric bypass;no abnormal reaction noted   PONV (postoperative nausea and vomiting)    S/P left unicompartmental knee replacement 08/05/2014     Social History   Socioeconomic History   Marital status: Single    Spouse name: Not on file   Number of children: Not on file   Years of education: Not on file   Highest education level: Some college, no degree  Occupational History   Not on file  Tobacco Use   Smoking status: Never   Smokeless tobacco: Never  Vaping Use   Vaping status: Never Used  Substance and Sexual Activity   Alcohol use: Yes    Alcohol/week: 0.0 standard drinks of alcohol    Comment:  rarely   Drug use: No   Sexual activity: Not Currently  Other Topics Concern   Not on file  Social History Narrative   Not on file   Social Determinants of Health   Financial Resource Strain: Low Risk  (12/29/2022)   Overall Financial Resource Strain (CARDIA)    Difficulty of Paying Living Expenses: Not very hard  Food Insecurity: No Food Insecurity (12/29/2022)   Hunger Vital Sign    Worried About Running Out of Food in the Last Year: Never true    Ran Out of Food in the Last Year: Never true  Transportation Needs: No Transportation Needs (12/29/2022)   PRAPARE - Doctor, general practice (Medical): No    Lack of Transportation (Non-Medical): No  Physical Activity: Insufficiently Active (12/29/2022)   Exercise Vital Sign    Days of Exercise per Week: 3 days    Minutes of Exercise per Session: 20 min  Stress: Stress Concern Present (12/29/2022)   [employer] of Occupational Health - Occupational Stress Questionnaire    Feeling of Stress : To some extent  Social Connections: Moderately Isolated (12/29/2022)   Social Connection and Isolation Panel [NHANES]    Frequency of Communication with Friends and Family: More than three times a week    Frequency of Social Gatherings with Friends and Family: Once a week    Attends Religious Services: 1 to 4 times per year    Active Member of [employer] or Organizations: No    Attends Engineer, structural: Not on file    Marital Status: Never married  Intimate Partner Violence: Not on file    Past Surgical History:  Procedure Laterality Date   APPENDECTOMY     ARTHROSCOPIC REPAIR ACL Left 20 yrs ago   CHOLECYSTECTOMY     COLECTOMY     COLONOSCOPY     GASTRIC BYPASS     herniated bowel repair     KNEE ARTHROSCOPY Left    PARTIAL KNEE ARTHROPLASTY Left 08/05/2014   Procedure: LEFT UNICOMPARTMENTAL KNEE;  Surgeon: Cheral Almas, MD;  Location: [facility] OR;  Service: Orthopedics;  Laterality: Left;   TOTAL KNEE ARTHROPLASTY Left 01/29/2019   TOTAL KNEE REVISION Left 01/29/2019   Procedure: CONVERSION OF LEFT UNICOMPARTMENTAL KNEE ARTHROPLASTY TO LEFT TOTAL KNEE ARTHROPLASTY;  Surgeon: Tarry Kos, MD;  Location: [facility] OR;  Service: Orthopedics;  Laterality: Left;    No family history on file.  Allergies  Allergen Reactions   Tape Rash    Current Outpatient Medications on File Prior to Visit  Medication Sig Dispense Refill   ALPRAZolam (XANAX) 0.5 MG tablet TAKE 1/2-1 TAB TWICE A DAY AS NEEDED FOR ANXIETY 24 tablet 0   buPROPion (WELLBUTRIN XL) 150 MG 24 hr tablet Take 150 mg by mouth daily.      Semaglutide-Weight Management (WEGOVY) 2.4 MG/0.75ML SOAJ INJECT 2.4 MG INTO THE SKIN ONCE A WEEK FOR 28 DAYS. 3 mL 0   sertraline (ZOLOFT) 50 MG tablet Take 50 mg by mouth daily.     traMADol (ULTRAM) 50 MG tablet Take 1-2 tablets (50-100 mg total) by mouth daily as needed. 20 tablet 0   No current facility-administered medications on file prior to visit.    BP 102/76   Pulse 77   Resp 18   Ht 5\' 8"  (1.727 m)   Wt 204 lb 9.6 oz (92.8 kg)   SpO2 99%   BMI 31.11 kg/m        Objective:  Physical Exam General Mental Status- Alert. General Appearance- Not in acute distress.   Skin General: Color- Normal Color. Moisture- Normal Moisture.  Neck Carotid Arteries- Normal color. Moisture- Normal Moisture. No carotid bruits. No JVD.  Chest and Lung Exam Auscultation: Breath Sounds:-Normal.  Cardiovascular Auscultation:Rythm- Regular. Murmurs & Other Heart Sounds:Auscultation of the heart reveals- No Murmurs.  Abdomen Inspection:-Inspeection Normal. Palpation/Percussion:Note:No mass. Palpation and Percussion of the abdomen reveal- Non Tender, Non Distended + BS, no rebound or guarding.   Neurologic Cranial Nerve exam:- CN III-XII intact(No nystagmus), symmetric smile. Strength:- 5/5 equal and symmetric strength both upper and lower extremities.        Assessment & Plan:  Assessment and Plan    Obesity: Significant weight loss (44 lbs) over 4 months on Wegovy. No significant side effects reported, except for one episode of vomiting due to overeating and occasional abdominal discomfort. -Continue Wegovy for weight loss.  Vitamin B12 deficiency: On B12 injections, last level was low. -Check B12 level today. -Consider adding oral B12 5000 mcg daily based on today's level.  Anxiety: Managed with Xanax, up to 24 pills per month. No illicit drug use reported. -Continue Xanax as needed for anxiety, up to 24 pills per month. -Plan to update contract and perform urine drug  screen in November.   With one event vomiting and occasional abdomen will get lipase level.   for low iron and anemia get cbc and iron panel.  General Health Maintenance: -Administer Tdap vaccine today. -Referral to gynecology for Pap smear.       Follow up date to be determined after lab review.   Esperanza Richters, PA-C

## 2023-01-04 ENCOUNTER — Ambulatory Visit: Payer: No Typology Code available for payment source

## 2023-01-04 LAB — COMPREHENSIVE METABOLIC PANEL
ALT: 17 U/L (ref 0–35)
AST: 21 U/L (ref 0–37)
Albumin: 4.2 g/dL (ref 3.5–5.2)
Alkaline Phosphatase: 72 U/L (ref 39–117)
BUN: 8 mg/dL (ref 6–23)
CO2: 29 mEq/L (ref 19–32)
Calcium: 9.4 mg/dL (ref 8.4–10.5)
Chloride: 100 mEq/L (ref 96–112)
Creatinine, Ser: 0.82 mg/dL (ref 0.40–1.20)
GFR: 84.02 mL/min (ref >60.00)
Glucose, Bld: 79 mg/dL (ref 70–99)
Potassium: 4.3 mEq/L (ref 3.5–5.1)
Sodium: 137 mEq/L (ref 135–145)
Total Bilirubin: 0.6 mg/dL (ref 0.2–1.2)
Total Protein: 6.4 g/dL (ref 6.0–8.3)

## 2023-01-04 LAB — IRON,TIBC AND FERRITIN PANEL
%SAT: 19 % (calc) (ref 16–45)
Ferritin: 36 ng/mL (ref 16–232)
Iron: 67 ug/dL (ref 40–190)
TIBC: 344 mcg/dL (calc) (ref 250–450)

## 2023-01-04 LAB — CBC WITH DIFFERENTIAL/PLATELET
Basophils Absolute: 0 10*3/uL (ref 0.0–0.1)
Basophils Relative: 0.8 % (ref 0.0–3.0)
Eosinophils Absolute: 0.1 10*3/uL (ref 0.0–0.7)
Eosinophils Relative: 1.2 % (ref 0.0–5.0)
HCT: 41.8 % (ref 36.0–46.0)
Hemoglobin: 13.4 g/dL (ref 12.0–15.0)
Lymphocytes Relative: 26.5 % (ref 12.0–46.0)
Lymphs Abs: 1.2 10*3/uL (ref 0.7–4.0)
MCHC: 32.1 g/dL (ref 30.0–36.0)
MCV: 83.4 fl (ref 78.0–100.0)
Monocytes Absolute: 0.4 10*3/uL (ref 0.1–1.0)
Monocytes Relative: 9.4 % (ref 3.0–12.0)
Neutro Abs: 2.9 10*3/uL (ref 1.4–7.7)
Neutrophils Relative %: 62.1 % (ref 43.0–77.0)
Platelets: 325 10*3/uL (ref 150.0–400.0)
RBC: 5.02 Mil/uL (ref 3.87–5.11)
RDW: 13.4 % (ref 11.5–15.5)
WBC: 4.7 10*3/uL (ref 4.0–10.5)

## 2023-01-04 LAB — VITAMIN B12: Vitamin B-12: 416 pg/mL (ref 211–911)

## 2023-01-04 LAB — LIPASE: Lipase: 31 U/L (ref 11.0–59.0)

## 2023-01-04 NOTE — Telephone Encounter (Signed)
PA approved. Effective 01/03/2023 to 01/03/2024

## 2023-01-07 ENCOUNTER — Ambulatory Visit: Payer: No Typology Code available for payment source

## 2023-01-12 ENCOUNTER — Ambulatory Visit: Payer: No Typology Code available for payment source

## 2023-01-12 DIAGNOSIS — M25551 Pain in right hip: Secondary | ICD-10-CM

## 2023-01-12 NOTE — Therapy (Signed)
OUTPATIENT PHYSICAL THERAPY LOWER EXTREMITY TREATMENT   Patient Name: Brandi Lutz MRN: [id number] DOB:November 16, 1975, 47 y.o., female Today's Date: 01/12/2023  END OF SESSION:  PT End of Session - 01/12/23 1629     Visit Number 3    Date for PT Re-Evaluation 02/08/23    Progress Note Due on Visit 10    PT Start Time 1617    PT Stop Time 1705    PT Time Calculation (min) 48 min    Activity Tolerance Patient tolerated treatment well    Behavior During Therapy [facility] for tasks assessed/performed               Past Medical History:  Diagnosis Date   Anxiety    Arthritis    Cancer (HCC)    colon   Depression    takes Wellbutrin and Zoloft daily   Diabetes mellitus without complication (HCC)    diet and gastric surgery controlled   Gastric bypass status for obesity 02/05/2013   Headache    History of colon polyps    benign   History of migraine    none since high school   Joint pain    Joint swelling    Other specified iron deficiency anemias 02/05/2013   2 iron infusions since gastric bypass;no abnormal reaction noted   PONV (postoperative nausea and vomiting)    S/P left unicompartmental knee replacement 08/05/2014   Past Surgical History:  Procedure Laterality Date   APPENDECTOMY     ARTHROSCOPIC REPAIR ACL Left 20 yrs ago   CHOLECYSTECTOMY     COLECTOMY     COLONOSCOPY     GASTRIC BYPASS     herniated bowel repair     KNEE ARTHROSCOPY Left    PARTIAL KNEE ARTHROPLASTY Left 08/05/2014   Procedure: LEFT UNICOMPARTMENTAL KNEE;  Surgeon: Cheral Almas, MD;  Location: [facility] OR;  Service: Orthopedics;  Laterality: Left;   TOTAL KNEE ARTHROPLASTY Left 01/29/2019   TOTAL KNEE REVISION Left 01/29/2019   Procedure: CONVERSION OF LEFT UNICOMPARTMENTAL KNEE ARTHROPLASTY TO LEFT TOTAL KNEE ARTHROPLASTY;  Surgeon: Tarry Kos, MD;  Location: [facility] OR;  Service: Orthopedics;  Laterality: Left;   Patient Active Problem List   Diagnosis Date Noted   Status post total left  knee replacement 01/29/2019   Primary osteoarthritis of left knee 01/02/2019   IDA (iron deficiency anemia) 03/18/2016   Malabsorption of iron 03/18/2016   Chronic diarrhea 09/11/2015   Family history of colon cancer 09/11/2015   History of colon polyps 09/11/2015   Status post left partial knee replacement 08/05/2014   Other iron deficiency anemias 02/05/2013   Gastric bypass status for obesity 02/05/2013    PCP: Esperanza Richters, PA  REFERRING PROVIDER: Gershon Mussel, Judie Petit, MD  REFERRING DIAG: R hip pain  THERAPY DIAG:  Pain in right hip  Rationale for Evaluation and Treatment: Rehabilitation  ONSET DATE: 4 months  SUBJECTIVE:   SUBJECTIVE STATEMENT: pt denies any pain today, the morning is the worst when it comes to pain.  Eval:Reports R hip progressive pain and stiffness, wants to be able to manage and prevent further decline  PERTINENT HISTORY: H/o L TKA 4 yrs ago.  R hip with progressive pain over last 3 to 4 months.  Referred to PT y her orthopedic surgeon PAIN:  Are you having pain? Yes: NPRS scale: 0/10 Pain location: R ant hip, lat groin Pain description: stiff , aching Aggravating factors: after prolonged sitting and lying down, worse after lying on L  side Relieving factors: tylenol helps, improves with movement  PRECAUTIONS: None  WEIGHT BEARING RESTRICTIONS: No  FALLS:  Has patient fallen in last 6 months? No  LIVING ENVIRONMENT: Lives with: lives with an adult companion Lives in: House/apartment Stairs: Yes: Internal: 11 steps; on right going up Has following equipment at home: Walker - 4 wheeled  OCCUPATION: seated work on [employer] all day   PLOF: Independent  PATIENT GOALS: manage R hip long term so it wont get worse  NEXT MD VISIT: none scheduled yet  OBJECTIVE:   DIAGNOSTIC FINDINGS: per Orthopedist OA , ,mild R hip  PATIENT SURVEYS:  LEFS 48/80: 60%  COGNITION: Overall cognitive status: Within functional limits for tasks  assessed     SENSATION: WFL   MUSCLE LENGTH: Hamstrings: Right wnl deg; Left wnl deg Prone knee flex test: Right p! 90 degrees knee flex deg; Left 100 deg knee flex without pain Fabers R p! And limited 15 degrees   POSTURE: No Significant postural limitations  PALPATION: Tender slighly R lateral hip over TFL  LOWER EXTREMITY ROM:  Passive ROM Right eval Left eval  Hip flexion Wfl but p!   Hip extension    Hip abduction -20   Hip adduction    Hip internal rotation 5 10  Hip external rotation 20   Knee flexion    Knee extension    Ankle dorsiflexion    Ankle plantarflexion    Ankle inversion    Ankle eversion     (Blank rows = not tested)  LOWER EXTREMITY MMT:  MMT Right eval Left eval  Hip flexion 4   Hip extension    Hip abduction    Hip adduction    Hip internal rotation    Hip external rotation 4   Knee flexion    Knee extension    Ankle dorsiflexion    Ankle plantarflexion    Ankle inversion    Ankle eversion     (Blank rows = wfl)  FUNCTIONAL TESTS:  6 minute walk test: walked 900  pain at 100', with compensatory antalgia, then improved   GAIT: Distance walked: 900' Assistive device utilized: None Level of assistance: Complete Independence  TODAY'S TREATMENT:                                                                                                                              DATE: 01/12/23 Recumbent Bike L2x24min S/L hip abduction x 10 bil S/L clamshell RTB x 10 bil Prone hip extension x 10 bil Supine bridges x 10  Supine figure 4 stretch x 30 sec bil Supine hip ER/IR (LTR) x 10 each side  R hip PROM ER/IR,flexion R hip distraction with IR mobilization  12/28/22:  Reassessed R hip ROM for flexion and abduction, ER Abduction no longer painful, still limited R hip flexion motion, to approximately 100 degrees with R lateral hip/ groin pain at end range.  R hip ER wfl now but pain  R groin lat hip at terminal range   Manual:  supine  for R hip mobilization with movement , laterally and inferiorly gliding R femoral head in combination with R hip flexion stretch.  15 reps 3 bouts  Therex:  Instructed in self mobilization, using L fist for lateral glide of R hip while performing R hip/LE lunge.  Also instructed in standing R hip abduction stretch in combination with sustained lateral glide of R femoral head, using blue theraband and performing in standing.   12/14/22 eval, instructed in prone R hip flexor/ ant hip stretch, as described below, also advised to initiate walking program to improve joint mobility R hip    PATIENT EDUCATION:  Education details: HEP update: see below Person educated: Patient Education method: Explanation and Demonstration Education comprehension: verbalized understanding, returned demonstration, verbal cues required, tactile cues required, and needs further education  HOME EXERCISE PROGRAM: Access Code: [email or web address] URL: [email or web address] Date: 01/12/2023 Prepared by: Verta Ellen  Exercises - Prone Quadriceps Stretch with Strap  - 1 x daily - 7 x weekly - 3 sets - 10 reps - Prone Hip Extension  - 1 x daily - 7 x weekly - 2 sets - 10 reps - Supine Hip Internal and External Rotation  - 1 x daily - 7 x weekly - 2 sets - 10 reps - 5 sec hold - Supine Figure 4 Piriformis Stretch  - 1 x daily - 7 x weekly - 3 sets - 3 reps - 30 sec hold - Sidelying Hip Abduction  - 1 x daily - 7 x weekly - 2 sets - 10 reps - Clamshell with Resistance  - 1 x daily - 7 x weekly - 2 sets - 10 reps - 3 sec hold  ASSESSMENT:  CLINICAL IMPRESSION: Patient is a 47 y.o. female who was seen today for physical therapy treatment for R hip pain and stiffness due to OA/RA. Progressed strengthening for hip ER, abd, and ext. Very challenged with hip strengthening exercise today, so added exercises for strengthening to HEP. Improved ROM of R hip observed today after mobilization. Would benefit from continuing to  progress strengthening to improve hip stability  OBJECTIVE IMPAIRMENTS: decreased activity tolerance, decreased mobility, difficulty walking, decreased ROM, decreased strength, hypomobility, and pain.   ACTIVITY LIMITATIONS: bending, standing, squatting, sleeping, stairs, and locomotion level  PARTICIPATION LIMITATIONS: laundry, shopping, community activity, and yard work  PERSONAL FACTORS: Behavior pattern, Fitness, Past/current experiences, Profession, and 1 comorbidity: anemia, DM  are also affecting patient's functional outcome.   REHAB POTENTIAL: Good  CLINICAL DECISION MAKING: Stable/uncomplicated  EVALUATION COMPLEXITY: Low   GOALS: Goals reviewed with patient? Yes  SHORT TERM GOALS: Target date: 12/28/22 Established today , I HEP Baseline: Goal status: met   LONG TERM GOALS: Target date: 02/08/23, 8 weeks  Able to complete 6 min walk test with distance of 1000' or greater, Baseline: 6 min, 900' Goal status: INITIAL  2.  Improve strength R hip ER and flexion to 5/5/ with MMT for improved stability for stairs, gait, transfers Baseline: 4/5 Goal status: INITIAL  3.  Patient will be able to consistently participate in a walking program 4-5 days per week to assist with maintaining jt nutrition and mobility R hip Baseline: to be established Goal status: INITIAL  4.  LEFS improve from 40% deficit to 30% or less Baseline:  Goal status: INITIAL    PLAN:  PT FREQUENCY: 1x/week  PT DURATION: 8 weeks  PLANNED INTERVENTIONS: Therapeutic exercises, Therapeutic activity, Neuromuscular re-education,  Balance training, Gait training, Patient/Family education, Self Care, and Joint mobilization  PLAN FOR NEXT SESSION: progress hip strengthening; reassess hip flexibility, possible try joint mobs, manual techniques, add more stretching, hip pendulum, adductor stretching, hip strengthening, with ball   Darleene Cleaver, PTA   01/12/2023, 5:24 PM

## 2023-01-14 ENCOUNTER — Other Ambulatory Visit: Payer: Self-pay | Admitting: Medical

## 2023-01-18 ENCOUNTER — Ambulatory Visit: Payer: No Typology Code available for payment source | Attending: Orthopaedic Surgery

## 2023-01-18 ENCOUNTER — Other Ambulatory Visit: Payer: Self-pay

## 2023-01-18 DIAGNOSIS — M25551 Pain in right hip: Secondary | ICD-10-CM | POA: Insufficient documentation

## 2023-01-18 NOTE — Therapy (Signed)
OUTPATIENT PHYSICAL THERAPY LOWER EXTREMITY TREATMENT   Patient Name: Brandi Lutz MRN: [id number] DOB:[birth year], 47 y.o., female Today's Date: 01/18/2023  END OF SESSION:  PT End of Session - 01/18/23 1637     Visit Number 4    Date for PT Re-Evaluation 02/08/23    Progress Note Due on Visit 10    PT Start Time 1622    PT Stop Time 1700    PT Time Calculation (min) 38 min    Activity Tolerance Patient tolerated treatment well    Behavior During Therapy WFL for tasks assessed/performed               Past Medical History:  Diagnosis Date   Anxiety    Arthritis    Cancer (HCC)    colon   Depression    takes Wellbutrin and Zoloft daily   Diabetes mellitus without complication (HCC)    diet and gastric surgery controlled   Gastric bypass status for obesity 02/05/2013   Headache    History of colon polyps    benign   History of migraine    none since high school   Joint pain    Joint swelling    Other specified iron deficiency anemias 02/05/2013   2 iron infusions since gastric bypass;no abnormal reaction noted   PONV (postoperative nausea and vomiting)    S/P left unicompartmental knee replacement 08/05/2014   Past Surgical History:  Procedure Laterality Date   APPENDECTOMY     ARTHROSCOPIC REPAIR ACL Left 20 yrs ago   CHOLECYSTECTOMY     COLECTOMY     COLONOSCOPY     GASTRIC BYPASS     herniated bowel repair     KNEE ARTHROSCOPY Left    PARTIAL KNEE ARTHROPLASTY Left 08/05/2014   Procedure: LEFT UNICOMPARTMENTAL KNEE;  Surgeon: Cheral Almas, MD;  Location: [facility] OR;  Service: Orthopedics;  Laterality: Left;   TOTAL KNEE ARTHROPLASTY Left 01/29/2019   TOTAL KNEE REVISION Left 01/29/2019   Procedure: CONVERSION OF LEFT UNICOMPARTMENTAL KNEE ARTHROPLASTY TO LEFT TOTAL KNEE ARTHROPLASTY;  Surgeon: Tarry Kos, MD;  Location: [facility] OR;  Service: Orthopedics;  Laterality: Left;   Patient Active Problem List   Diagnosis Date Noted   Status post total left  knee replacement 01/29/2019   Primary osteoarthritis of left knee 01/02/2019   IDA (iron deficiency anemia) 03/18/2016   Malabsorption of iron 03/18/2016   Chronic diarrhea 09/11/2015   Family history of colon cancer 09/11/2015   History of colon polyps 09/11/2015   Status post left partial knee replacement 08/05/2014   Other iron deficiency anemias 02/05/2013   Gastric bypass status for obesity 02/05/2013    PCP: Esperanza Richters, PA  REFERRING PROVIDER: Gershon Mussel, Judie Petit, MD  REFERRING DIAG: R hip pain  THERAPY DIAG:  Pain in right hip  Rationale for Evaluation and Treatment: Rehabilitation  ONSET DATE: 4 months  SUBJECTIVE:   SUBJECTIVE STATEMENT: pt denies any pain today, the morning is the worst when it comes to pain.  Eval:Reports R hip progressive pain and stiffness, wants to be able to manage and prevent further decline  PERTINENT HISTORY: H/o L TKA 4 yrs ago.  R hip with progressive pain over last 3 to 4 months.  Referred to PT y her orthopedic surgeon PAIN:  Are you having pain? Yes: NPRS scale: 0/10 Pain location: R ant hip, lat groin Pain description: stiff , aching Aggravating factors: after prolonged sitting and lying down, worse after lying on L  side Relieving factors: tylenol helps, improves with movement  PRECAUTIONS: None  WEIGHT BEARING RESTRICTIONS: No  FALLS:  Has patient fallen in last 6 months? No  LIVING ENVIRONMENT: Lives with: lives with an adult companion Lives in: House/apartment Stairs: Yes: Internal: 11 steps; on right going up Has following equipment at home: Walker - 4 wheeled  OCCUPATION: seated work on [employer] all day   PLOF: Independent  PATIENT GOALS: manage R hip long term so it wont get worse  NEXT MD VISIT: none scheduled yet  OBJECTIVE:   DIAGNOSTIC FINDINGS: per Orthopedist OA , ,mild R hip  PATIENT SURVEYS:  LEFS 48/80: 60%  COGNITION: Overall cognitive status: Within functional limits for tasks  assessed     SENSATION: WFL   MUSCLE LENGTH: Hamstrings: Right wnl deg; Left wnl deg Prone knee flex test: Right p! 90 degrees knee flex deg; Left 100 deg knee flex without pain Fabers R p! And limited 15 degrees   POSTURE: No Significant postural limitations  PALPATION: Tender slighly R lateral hip over TFL  LOWER EXTREMITY ROM:  Passive ROM Right eval Left eval  Hip flexion Wfl but p!   Hip extension    Hip abduction -20   Hip adduction    Hip internal rotation 5 10  Hip external rotation 20   Knee flexion    Knee extension    Ankle dorsiflexion    Ankle plantarflexion    Ankle inversion    Ankle eversion     (Blank rows = not tested)  LOWER EXTREMITY MMT:  MMT Right eval Left eval R 8/6/4  Hip flexion 4  5  Hip extension     Hip abduction   5  Hip adduction     Hip internal rotation     Hip external rotation 4  5  Knee flexion     Knee extension     Ankle dorsiflexion     Ankle plantarflexion     Ankle inversion     Ankle eversion      (Blank rows = wfl)  FUNCTIONAL TESTS:  6 minute walk test: walked 900  pain at 100', with compensatory antalgia, then improved   GAIT: Distance walked: 900' Assistive device utilized: None Level of assistance: Complete Independence  TODAY'S TREATMENT:                                                                                                                              DATE: 01/18/23:  6 min walk test, walked over 1200', no pain , no exertion noted Therex: practiced and reviewed prone R knee flexion stretch to isolate quads/ hip flexors Reviewed and practiced R hip lunge with foot on chair seat, with lateral glides R hip jt with L fist in groin, for mobilization with movement , to use for pain relief, management Prone alt hip ext 5 sec holds. Supine R hip ER stretch with foot across knee  01/12/23 Recumbent Bike L2x73min  S/L hip abduction x 10 bil S/L clamshell RTB x 10 bil Prone hip extension x 10  bil Supine bridges x 10  Supine figure 4 stretch x 30 sec bil Supine hip ER/IR (LTR) x 10 each side  R hip PROM ER/IR,flexion R hip distraction with IR mobilization  12/28/22:  Reassessed R hip ROM for flexion and abduction, ER Abduction no longer painful, still limited R hip flexion motion, to approximately 100 degrees with R lateral hip/ groin pain at end range.  R hip ER wfl now but pain R groin lat hip at terminal range   Manual:  supine for R hip mobilization with movement , laterally and inferiorly gliding R femoral head in combination with R hip flexion stretch.  15 reps 3 bouts  Therex:  Instructed in self mobilization, using L fist for lateral glide of R hip while performing R hip/LE lunge.  Also instructed in standing R hip abduction stretch in combination with sustained lateral glide of R femoral head, using blue theraband and performing in standing.   12/14/22 eval, instructed in prone R hip flexor/ ant hip stretch, as described below, also advised to initiate walking program to improve joint mobility R hip    PATIENT EDUCATION:  Education details: HEP update: see below Person educated: Patient Education method: Explanation and Demonstration Education comprehension: verbalized understanding, returned demonstration, verbal cues required, tactile cues required, and needs further education  HOME EXERCISE PROGRAM: Access Code: [email or web address] URL: [email or web address] Date: 01/12/2023 Prepared by: Verta Ellen  Exercises - Prone Quadriceps Stretch with Strap  - 1 x daily - 7 x weekly - 3 sets - 10 reps - Prone Hip Extension  - 1 x daily - 7 x weekly - 2 sets - 10 reps - Supine Hip Internal and External Rotation  - 1 x daily - 7 x weekly - 2 sets - 10 reps - 5 sec hold - Supine Figure 4 Piriformis Stretch  - 1 x daily - 7 x weekly - 3 sets - 3 reps - 30 sec hold - Sidelying Hip Abduction  - 1 x daily - 7 x weekly - 2 sets - 10 reps - Clamshell with Resistance  - 1  x daily - 7 x weekly - 2 sets - 10 reps - 3 sec hold  ASSESSMENT:  CLINICAL IMPRESSION: Patient is a 47 y.o. female who participated with skilled  physical therapy treatment for R hip pain and stiffness due to OA/RA. Reassessed her goals for discharge.  No new exercises added as she has a good core program already established. At this pt she has met her goals and is ready for DC. She has improved and should be able to manage her symptoms well going forward. OBJECTIVE IMPAIRMENTS: decreased activity tolerance, decreased mobility, difficulty walking, decreased ROM, decreased strength, hypomobility, and pain.   ACTIVITY LIMITATIONS: bending, standing, squatting, sleeping, stairs, and locomotion level  PARTICIPATION LIMITATIONS: laundry, shopping, community activity, and yard work  PERSONAL FACTORS: Behavior pattern, Fitness, Past/current experiences, Profession, and 1 comorbidity: anemia, DM  are also affecting patient's functional outcome.   REHAB POTENTIAL: Good  CLINICAL DECISION MAKING: Stable/uncomplicated  EVALUATION COMPLEXITY: Low   GOALS: Goals reviewed with patient? Yes  SHORT TERM GOALS: Target date: 12/28/22 Established today , I HEP Baseline: Goal status: met   LONG TERM GOALS: Target date: 02/08/23, 8 weeks  Able to complete 6 min walk test with distance of 1000' or greater, Baseline: 6 min, 900' Goal status: 1200', no pain goal met  2.  Improve strength R hip ER and flexion to 5/5/ with MMT for improved stability for stairs, gait, transfers Baseline: 4/5 Goal status: 5/5 goal met  3.  Patient will be able to consistently participate in a walking program 4-5 days per week to assist with maintaining jt nutrition and mobility R hip Baseline: to be established Goal status: INITIAL  4.  LEFS improve from 40% deficit to 30% or less Baseline:  Goal status: INITIAL  56/80: 30%    PLAN:  PT FREQUENCY: 1x/week  PT DURATION: 8 weeks  PLANNED INTERVENTIONS:  Therapeutic exercises, Therapeutic activity, Neuromuscular re-education, Balance training, Gait training, Patient/Family education, Self Care, and Joint mobilization  PLAN FOR NEXT SESSION: DC at this time , goals met Mignonne Afonso L Kiyonna Tortorelli, PT  DPT, OCS 01/18/2023, 5:09 PM

## 2023-02-01 ENCOUNTER — Other Ambulatory Visit: Payer: Self-pay | Admitting: Medical

## 2023-02-02 NOTE — Telephone Encounter (Signed)
Requesting: xanax Contract:05/17/22 UDS:05/17/22 Last Visit:01/03/23 Next Visit:n/a Last Refill:01/03/23  Please Advise

## 2023-02-16 ENCOUNTER — Other Ambulatory Visit (HOSPITAL_BASED_OUTPATIENT_CLINIC_OR_DEPARTMENT_OTHER): Payer: Self-pay

## 2023-02-16 ENCOUNTER — Other Ambulatory Visit: Payer: Self-pay

## 2023-02-16 ENCOUNTER — Other Ambulatory Visit: Payer: Self-pay | Admitting: Medical

## 2023-02-17 ENCOUNTER — Other Ambulatory Visit (HOSPITAL_BASED_OUTPATIENT_CLINIC_OR_DEPARTMENT_OTHER): Payer: Self-pay

## 2023-02-17 ENCOUNTER — Other Ambulatory Visit: Payer: Self-pay | Admitting: Medical

## 2023-02-18 ENCOUNTER — Other Ambulatory Visit: Payer: Self-pay | Admitting: Family

## 2023-02-18 DIAGNOSIS — K909 Intestinal malabsorption, unspecified: Secondary | ICD-10-CM

## 2023-02-18 DIAGNOSIS — Z9884 Bariatric surgery status: Secondary | ICD-10-CM

## 2023-02-18 DIAGNOSIS — D508 Other iron deficiency anemias: Secondary | ICD-10-CM

## 2023-02-21 ENCOUNTER — Encounter: Payer: Self-pay | Admitting: Family

## 2023-02-21 ENCOUNTER — Inpatient Hospital Stay: Payer: No Typology Code available for payment source | Admitting: Family

## 2023-02-21 ENCOUNTER — Inpatient Hospital Stay: Payer: No Typology Code available for payment source | Attending: Family

## 2023-02-21 VITALS — BP 117/86 | HR 92 | Temp 98.1°F | Resp 17 | Wt 186.1 lb

## 2023-02-21 DIAGNOSIS — K909 Intestinal malabsorption, unspecified: Secondary | ICD-10-CM | POA: Diagnosis not present

## 2023-02-21 DIAGNOSIS — Z9884 Bariatric surgery status: Secondary | ICD-10-CM | POA: Diagnosis not present

## 2023-02-21 DIAGNOSIS — D508 Other iron deficiency anemias: Secondary | ICD-10-CM | POA: Diagnosis present

## 2023-02-21 DIAGNOSIS — K912 Postsurgical malabsorption, not elsewhere classified: Secondary | ICD-10-CM | POA: Diagnosis not present

## 2023-02-21 DIAGNOSIS — D510 Vitamin B12 deficiency anemia due to intrinsic factor deficiency: Secondary | ICD-10-CM | POA: Diagnosis present

## 2023-02-21 LAB — CBC WITH DIFFERENTIAL (CANCER CENTER ONLY)
Abs Immature Granulocytes: 0 10*3/uL (ref 0.00–0.07)
Basophils Absolute: 0 10*3/uL (ref 0.0–0.1)
Basophils Relative: 1 %
Eosinophils Absolute: 0.1 10*3/uL (ref 0.0–0.5)
Eosinophils Relative: 2 %
HCT: 40.9 % (ref 36.0–46.0)
Hemoglobin: 13.1 g/dL (ref 12.0–15.0)
Immature Granulocytes: 0 %
Lymphocytes Relative: 34 %
Lymphs Abs: 1.4 10*3/uL (ref 0.7–4.0)
MCH: 26.5 pg (ref 26.0–34.0)
MCHC: 32 g/dL (ref 30.0–36.0)
MCV: 82.6 fL (ref 80.0–100.0)
Monocytes Absolute: 0.3 10*3/uL (ref 0.1–1.0)
Monocytes Relative: 8 %
Neutro Abs: 2.3 10*3/uL (ref 1.7–7.7)
Neutrophils Relative %: 55 %
Platelet Count: 305 10*3/uL (ref 150–400)
RBC: 4.95 MIL/uL (ref 3.87–5.11)
RDW: 13.1 % (ref 11.5–15.5)
WBC Count: 4.1 10*3/uL (ref 4.0–10.5)
nRBC: 0 % (ref 0.0–0.2)

## 2023-02-21 LAB — RETICULOCYTES
Immature Retic Fract: 4 % (ref 2.3–15.9)
RBC.: 4.89 MIL/uL (ref 3.87–5.11)
Retic Count, Absolute: 41.6 10*3/uL (ref 19.0–186.0)
Retic Ct Pct: 0.9 % (ref 0.4–3.1)

## 2023-02-21 LAB — FERRITIN: Ferritin: 27 ng/mL (ref 11–307)

## 2023-02-21 NOTE — Progress Notes (Signed)
Hematology and Oncology Follow Up Visit  Brandi Lutz [id number] [birth year] 47 y.o. 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 [name] doing well and has no complaints at this time.  She has not noted any blood loss. No bruising or petechiae.  No c/o fatigue.  No fever, chills, n/v, cough, rash, dizziness, SOB, chest pain, palpitations, abdominal pain or changes in bowel or bladder habits.  No swelling, tenderness, numbness or tingling in her extremities at this time.  No falls or syncope.  Appetite and hydration are good. Weight is stable at 186 lbs.   ECOG Performance Status: 1 - Symptomatic but completely ambulatory  Medications:  Allergies as of 02/21/2023       Reactions   Tape Rash        Medication List        Accurate as of February 21, 2023  2:08 PM. If you have any questions, ask your nurse or doctor.          ALPRAZolam 0.5 MG tablet Commonly known as: XANAX TAKE 1/2-1 TAB TWICE A DAY AS NEEDED FOR ANXIETY   buPROPion 150 MG 24 hr tablet Commonly known as: WELLBUTRIN XL Take 1 tablet (150 mg total) by mouth daily.   cyanocobalamin 1000 MCG tablet Take 1,000 mcg by mouth daily.   sertraline 50 MG tablet Commonly known as: ZOLOFT Take 1 tablet (50 mg total) by mouth daily.   traMADol 50 MG tablet Commonly known as: ULTRAM Take 1-2 tablets (50-100 mg total) by mouth daily as needed.   Wegovy 2.4 MG/0.75ML Soaj Generic drug: Semaglutide-Weight Management Inject 2.4 mg into the skin once a week.        Allergies:  Allergies  Allergen Reactions   Tape Rash    Past Medical History, Surgical history, Social history, and Family History were reviewed and updated.  Review of Systems: All other 10 point  review of systems is negative.   Physical Exam:  vitals were not taken for this visit.   Wt Readings from Last 3 Encounters:  01/03/23 204 lb 9.6 oz (92.8 kg)  02/19/22 241 lb 1.9 oz (109.4 kg)  01/27/22 245 lb (111.1 kg)    Ocular: Sclerae unicteric, pupils equal, round and reactive to light Ear-nose-throat: Oropharynx clear, dentition fair Lymphatic: No cervical or supraclavicular adenopathy Lungs no rales or rhonchi, good excursion bilaterally Heart regular rate and rhythm, no murmur appreciated Abd soft, nontender, positive bowel sounds MSK no focal spinal tenderness, no joint edema Neuro: non-focal, well-oriented, appropriate affect Breasts: Deferred   Lab Results  Component Value Date   WBC 4.1 02/21/2023   HGB 13.1 02/21/2023   HCT 40.9 02/21/2023   MCV 82.6 02/21/2023   PLT 305 02/21/2023   Lab Results  Component Value Date   FERRITIN 36 01/03/2023   IRON 67 01/03/2023   TIBC 344 01/03/2023   UIBC 286 11/16/2022   IRONPCTSAT 19 01/03/2023   Lab Results  Component Value Date   RETICCTPCT 0.9 02/21/2023   RBC 4.89 02/21/2023   RETICCTABS 31.3 05/02/2015   No results found for: "KPAFRELGTCHN", "LAMBDASER", "KAPLAMBRATIO" No results found for: "IGGSERUM", "IGA", "IGMSERUM" No results found for: "TOTALPROTELP", "ALBUMINELP", "A1GS", "A2GS", "BETS", "BETA2SER", "GAMS", "MSPIKE", "SPEI"  Chemistry      Component Value Date/Time   NA 137 01/03/2023 1355   NA 141 04/16/2022 1529   K 4.3 01/03/2023 1355   CL 100 01/03/2023 1355   CO2 29 01/03/2023 1355   BUN 8 01/03/2023 1355   BUN 8 04/16/2022 1529   CREATININE 0.82 01/03/2023 1355   CREATININE 0.93 09/16/2020 1343      Component Value Date/Time   CALCIUM 9.4 01/03/2023 1355   ALKPHOS 72 01/03/2023 1355   AST 21 01/03/2023 1355   AST 43 (H) 09/16/2020 1343   ALT 17 01/03/2023 1355   ALT 70 (H) 09/16/2020 1343   BILITOT 0.6 01/03/2023 1355   BILITOT 0.3 04/16/2022 1529   BILITOT 0.4 09/16/2020 1343        Impression and Plan: Ms. Brandi Lutz is a very pleasant 47 yo African American female with iron deficiency anemia secondary to malabsorption after gastric bypass.  Iron studies are pending.  Follow-up in 6 months.   Eileen Stanford, NP 9/9/20242:08 PM

## 2023-02-22 LAB — IRON AND IRON BINDING CAPACITY (CC-WL,HP ONLY)
Iron: 78 ug/dL (ref 28–170)
Saturation Ratios: 22 % (ref 10.4–31.8)
TIBC: 358 ug/dL (ref 250–450)
UIBC: 280 ug/dL (ref 148–442)

## 2023-03-05 ENCOUNTER — Other Ambulatory Visit: Payer: Self-pay | Admitting: Medical

## 2023-03-06 ENCOUNTER — Other Ambulatory Visit: Payer: Self-pay | Admitting: Family

## 2023-03-07 NOTE — Telephone Encounter (Signed)
Rx refill xanax sent to pharmacy.

## 2023-03-07 NOTE — Telephone Encounter (Signed)
Requesting: alprazolam 0.5mg   Contract: 05/05/22 UDS: 05/05/22 Last Visit: 01/03/23 Next Visit: None Last Refill: 02/02/23 #24 and 0RF   Please Advise

## 2023-03-10 ENCOUNTER — Other Ambulatory Visit (HOSPITAL_COMMUNITY)
Admission: RE | Admit: 2023-03-10 | Discharge: 2023-03-10 | Disposition: A | Discharge status: Home / Self Care | Payer: No Typology Code available for payment source | Source: Ambulatory Visit | Attending: Family Medicine | Admitting: Family Medicine

## 2023-03-10 ENCOUNTER — Encounter: Payer: Self-pay | Admitting: Family Medicine

## 2023-03-10 ENCOUNTER — Ambulatory Visit (INDEPENDENT_AMBULATORY_CARE_PROVIDER_SITE_OTHER): Payer: No Typology Code available for payment source | Admitting: Family Medicine

## 2023-03-10 VITALS — BP 124/84 | HR 92 | Ht 68.0 in | Wt 187.0 lb

## 2023-03-10 DIAGNOSIS — Z01419 Encounter for gynecological examination (general) (routine) without abnormal findings: Secondary | ICD-10-CM | POA: Diagnosis present

## 2023-03-10 DIAGNOSIS — Z1339 Encounter for screening examination for other mental health and behavioral disorders: Secondary | ICD-10-CM

## 2023-03-10 DIAGNOSIS — Z1231 Encounter for screening mammogram for malignant neoplasm of breast: Secondary | ICD-10-CM

## 2023-03-10 NOTE — Progress Notes (Signed)
ANNUAL EXAM Patient name: Brandi Lutz MRN [id number]  Date of birth: January 15, 1976 Chief Complaint:   Annual Exam  History of Present Illness:   Brandi Lutz is a 47 y.o.  [id number]  female  being seen today for a routine annual exam.  Current complaints: none. Still having periods. Not currently sexually active.  Patient's last menstrual period was 02/15/2023 (exact date).    Last pap unsure. Results were:  normal per patint . H/O abnormal pap: no Last mammogram: 03/2022. Results were: normal. Family h/o breast cancer: yes great grandmother Last colonoscopy: 2 years ago. Results were: normal. Family h/o colorectal cancer: no     03/10/2023    3:25 PM 01/03/2023    1:12 PM  Depression screen PHQ 2/9  Decreased Interest 0 0  Down, Depressed, Hopeless 0 0  PHQ - 2 Score 0 0  Altered sleeping 1   Tired, decreased energy 0   Change in appetite 0   Feeling bad or failure about yourself  0   Trouble concentrating 0   Moving slowly or fidgety/restless 0   Suicidal thoughts 0   PHQ-9 Score 1         03/10/2023    3:26 PM  GAD 7 : Generalized Anxiety Score  Nervous, Anxious, on Edge 1  Control/stop worrying 1  Worry too much - different things 1  Trouble relaxing 1  Restless 0  Easily annoyed or irritable 0  Afraid - awful might happen 0  Total GAD 7 Score 4     Review of Systems:   Pertinent items are noted in HPI Denies any headaches, blurred vision, fatigue, shortness of breath, chest pain, abdominal pain, abnormal vaginal discharge/itching/odor/irritation, problems with periods, bowel movements, urination, or intercourse unless otherwise stated above. Pertinent History Reviewed:  Reviewed past medical,surgical, social and family history.  Reviewed problem list, medications and allergies. Physical Assessment:   Vitals:   03/10/23 1512  BP: 124/84  Pulse: 92  Weight: 187 lb (84.8 kg)  Height: 5\' 8"  (1.727 m)  Body mass index is 28.43 kg/m.        Physical  Examination:   General appearance - well appearing, and in no distress  Mental status - alert, oriented to person, place, and time  Psych:  She has a normal mood and affect  Skin - warm and dry, normal color, no suspicious lesions noted  Chest - effort normal, all lung fields clear to auscultation bilaterally  Heart - normal rate and regular rhythm  Neck:  midline trachea, no thyromegaly or nodules  Breasts - breasts appear normal, no suspicious masses, no skin or nipple changes or axillary nodes  Abdomen - soft, nontender, nondistended, no masses or organomegaly  Pelvic - VULVA: normal appearing vulva with no masses, tenderness or lesions  VAGINA: normal appearing vagina with normal color and discharge, no lesions  CERVIX: normal appearing cervix without discharge or lesions, no CMT  Thin prep pap is done with HR HPV cotesting  UTERUS: uterus is felt to be normal size, shape, consistency and nontender   ADNEXA: No adnexal masses or tenderness noted.  Extremities:  No swelling or varicosities noted  Chaperone present for exam  Assessment & Plan:  1. Well woman exam with routine gynecological exam - Cytology - PAP( [facility])  2. Encounter for screening mammogram for malignant neoplasm of breast - MM 3D SCREENING MAMMOGRAM BILATERAL BREAST; Future   Labs/procedures today: no other labs needed.  Orders Placed This Encounter  Procedures   MM 3D SCREENING MAMMOGRAM BILATERAL BREAST    Meds: No orders of the defined types were placed in this encounter.   Follow-up: No follow-ups on file.  Levie Heritage, DO 03/10/2023 4:55 PM

## 2023-03-14 LAB — CYTOLOGY - PAP
Comment: NEGATIVE
Diagnosis: NEGATIVE
High risk HPV: NEGATIVE

## 2023-03-15 ENCOUNTER — Telehealth: Payer: Self-pay

## 2023-03-15 NOTE — Telephone Encounter (Signed)
I connected with  Brandi Lutz on 03/15/23 by telephone and verified that I am speaking with the correct person using two identifiers.  Patient informed of normal pap smear results. Patient has no questions at this time and voiced understanding of results.

## 2023-03-31 ENCOUNTER — Encounter (HOSPITAL_BASED_OUTPATIENT_CLINIC_OR_DEPARTMENT_OTHER): Payer: Self-pay

## 2023-03-31 ENCOUNTER — Ambulatory Visit (HOSPITAL_BASED_OUTPATIENT_CLINIC_OR_DEPARTMENT_OTHER)
Admission: RE | Admit: 2023-03-31 | Discharge: 2023-03-31 | Disposition: A | Discharge status: Home / Self Care | Payer: No Typology Code available for payment source | Source: Ambulatory Visit | Attending: Family Medicine | Admitting: Family Medicine

## 2023-03-31 ENCOUNTER — Other Ambulatory Visit: Payer: Self-pay | Admitting: Medical

## 2023-03-31 DIAGNOSIS — Z1231 Encounter for screening mammogram for malignant neoplasm of breast: Secondary | ICD-10-CM | POA: Insufficient documentation

## 2023-04-01 NOTE — Telephone Encounter (Signed)
Requesting: alprazolam 0.5mg   Contract: 05/17/22 UDS: 05/05/22 Last Visit: 01/03/23 Next Visit: None Last Refill: 03/07/23 #24 and 0RF   Please Advise

## 2023-04-02 NOTE — Telephone Encounter (Signed)
Rx refill sent. Want pt to follow up  toward end of next month for office visit. She will be due for contract and uds. Will up date both on office visit.

## 2023-05-01 ENCOUNTER — Other Ambulatory Visit: Payer: Self-pay | Admitting: Medical

## 2023-05-02 NOTE — Telephone Encounter (Signed)
Requesting: xanax Contract:05/17/22 UDS:05/17/22 Last Visit:01/03/23 Next Visit:n/a Last Refill:04/02/23  Please Advise

## 2023-05-04 ENCOUNTER — Other Ambulatory Visit: Payer: Self-pay | Admitting: Medical

## 2023-05-04 NOTE — Telephone Encounter (Signed)
Requesting: Xanax 0.5mg  Contract: 05/05/2022 UDS: 05/05/2022 Last Visit: 01/03/2023 Next Visit: N/A Last Refill: 04/02/2023  Please Advise

## 2023-05-05 ENCOUNTER — Other Ambulatory Visit: Payer: Self-pay

## 2023-05-05 ENCOUNTER — Other Ambulatory Visit: Payer: No Typology Code available for payment source

## 2023-05-05 ENCOUNTER — Other Ambulatory Visit: Payer: Self-pay | Admitting: Medical

## 2023-05-05 DIAGNOSIS — Z79899 Other long term (current) drug therapy: Secondary | ICD-10-CM

## 2023-05-05 NOTE — Telephone Encounter (Signed)
Pt unable to get off work , pt is coming in today at 3 for a lab visit for the UDS & contract , and virtual visit tomorrow. If pts needs other labs she said she is okay with getting them done today as well

## 2023-05-05 NOTE — Telephone Encounter (Signed)
Pt has lab appointment scheduled 05/05/2023.

## 2023-05-05 NOTE — Telephone Encounter (Signed)
Rx xanax refill sent will do virtual visit tomorrow

## 2023-05-06 ENCOUNTER — Telehealth (INDEPENDENT_AMBULATORY_CARE_PROVIDER_SITE_OTHER): Payer: No Typology Code available for payment source | Admitting: Medical

## 2023-05-06 DIAGNOSIS — Z79899 Other long term (current) drug therapy: Secondary | ICD-10-CM

## 2023-05-06 DIAGNOSIS — F419 Anxiety disorder, unspecified: Secondary | ICD-10-CM

## 2023-05-06 MED ORDER — HYDROXYZINE HCL 10 MG PO TABS
ORAL_TABLET | ORAL | 0 refills | Status: DC
Start: 2023-05-06 — End: 2023-06-06

## 2023-05-06 NOTE — Patient Instructions (Addendum)
Anxiety and hx of reactive deprssion Increased stress due to work-related changes and responsibilities. Increased reliance on Xanax. Occasional tension headaches reported. -Continue current regimen of Wellbutrin, Zoloft, and Xanax. -Add Hydroxyzine 10mg  tablets as an alternative to Xanax for occasional use.(but when you try hydroxyzine not to use with xanax. Providing as alternative to use) -Encourage regular exercise as a stress management strategy.  Weight Loss Significant weight loss achieved with WUJWJX. Concerns about maintaining muscle mass during weight loss. -Continue current dose of Wegovy 2.4mg . -Encourage regular exercise, particularly strength training, to maintain muscle mass. -Plan to reassess in January for potential dose reduction. Possibly decrease wegovy and gradually taper down on dose very slowly.  General Health Maintenance -Continue over-the-counter Vitamin B12 supplementation. -Plan to follow up in January to assess anxiety management, Wegovy dosage, and overall health status.  Follow up January or sooner if needed

## 2023-05-06 NOTE — Progress Notes (Signed)
Subjective:    Patient ID: Brandi Lutz, female    DOB: [birth year], 47 y.o.   MRN: [id number]  HPI Virtual Visit via Video Note  I connected with Brandi Lutz on 05/06/23 at  1:20 PM EST by a video enabled telemedicine application and verified that I am speaking with the correct person using two identifiers.  Location: Patient: office [place] Provider: home   I discussed the limitations of evaluation and management by telemedicine and the availability of in person appointments. The patient expressed understanding and agreed to proceed.   History of Present Illness: Discussed the use of AI scribe software for clinical note transcription with the patient, who gave verbal consent to proceed.  History of Present Illness   The patient, a Merchandiser, retail at [employer], presents with increased anxiety related to recent corporate changes and increased workload. She reports that the company's recent layoffs and the expectation of taking on additional responsibilities have significantly contributed to her stress levels. She was also "voluntold" to be part of a project, adding to her anxiety. The patient has been relying more on Xanax to manage her anxiety, taking half a tablet in the morning and another half in the evening to calm her thoughts. She reports occasional tension-like headaches at the end of the day, which are not always present.  The patient also mentions a significant weight loss of 74 pounds, which she attributes to a healthy diet and the use of Wegovy. She expresses a desire to maintain her current weight and is considering reducing the dosage of Wegovy in the future.  In addition to her anxiety and weight management, the patient is also dealing with the stress of the current political climate, specifically the potential for denaturalization of [place] citizens, which she fears could affect her as a naturalized citizen from [place].  The patient's current medication regimen includes  Wellbutrin, Zoloft, and Xanax, with a recent addition of hydroxyzine as an alternative to Xanax. She also takes over-the-counter vitamin B12 as recommended.        Observations/Objective:General-no acute distress, pleasant, oriented. Lungs- on inspection lungs appear unlabored. Neck- no tracheal deviation or jvd on inspection. Neuro- gross motor function appears intact.   Assessment and Plan: Anxiety Increased stress due to work-related changes and responsibilities. Increased reliance on Xanax. Tension headaches reported. -Continue current regimen of Wellbutrin, Zoloft, and Xanax. -Add Hydroxyzine 10mg  tablets as an alternative to Xanax for occasional use.(but when you try hydroxyzine not to use with xanax. Providing as alternative to use) -Encourage regular exercise as a stress management strategy.  Weight Loss Significant weight loss achieved with JXBJYN. Concerns about maintaining muscle mass during weight loss. -Continue current dose of Wegovy 2.4mg . -Encourage regular exercise, particularly strength training, to maintain muscle mass. -Plan to reassess in January for potential dose reduction. Possibly decrease wegovy and gradually taper down on dose very slowly.  General Health Maintenance -Continue over-the-counter Vitamin B12 supplementation. -Plan to follow up in January to assess anxiety management, Wegovy dosage, and overall health status.  Follow Up Instructions:    I discussed the assessment and treatment plan with the patient. The patient was provided an opportunity to ask questions and all were answered. The patient agreed with the plan and demonstrated an understanding of the instructions.   The patient was advised to call back or seek an in-person evaluation if the symptoms worsen or if the condition fails to improve as anticipated.     Esperanza Richters, PA-C    Review  of Systems     Objective:   Physical Exam        Assessment & Plan:

## 2023-05-07 LAB — DRUG MONITORING PANEL 376104, URINE

## 2023-05-07 LAB — DM TEMPLATE

## 2023-05-10 NOTE — Telephone Encounter (Signed)
Rx was refused due to a refill already being sent in on 05/05/2023.

## 2023-05-10 NOTE — Telephone Encounter (Signed)
If new refill request calls in notify pharmacy I have already sent refill.

## 2023-06-06 ENCOUNTER — Other Ambulatory Visit: Payer: Self-pay | Admitting: Medical

## 2023-06-06 MED ORDER — ALPRAZOLAM 0.5 MG PO TABS: ORAL_TABLET | ORAL | 3 refills | Status: DC

## 2023-06-06 NOTE — Telephone Encounter (Signed)
Requesting: xanax Contract:05/10/23 UDS:05/05/23 Last Visit:05/06/23 Next Visit:n/a Last Refill:05/05/23  Please Advise

## 2023-06-06 NOTE — Telephone Encounter (Signed)
Refilled her hydroxyzine. But her contract and uds for xanax if over due by one month. Will you check to see if that is the case. If so please have her come in to signs and give uds tomorrow or on Thursday. When up to date will refill xanax.

## 2023-06-06 NOTE — Addendum Note (Signed)
Addended by: Gwenevere Abbot on: 06/06/2023 05:28 PM   Modules accepted: Orders

## 2023-06-06 NOTE — Telephone Encounter (Signed)
I reviewed and she is up to date on uds and contract so sent in xanax.

## 2023-07-06 ENCOUNTER — Other Ambulatory Visit: Payer: Self-pay | Admitting: Medical

## 2023-07-09 ENCOUNTER — Other Ambulatory Visit: Payer: Self-pay | Admitting: Medical

## 2023-07-22 ENCOUNTER — Ambulatory Visit: Payer: No Typology Code available for payment source | Admitting: Medical

## 2023-07-22 VITALS — BP 122/72 | HR 94 | Resp 18 | Ht 68.0 in | Wt 169.0 lb

## 2023-07-22 DIAGNOSIS — L603 Nail dystrophy: Secondary | ICD-10-CM | POA: Diagnosis not present

## 2023-07-22 DIAGNOSIS — E663 Overweight: Secondary | ICD-10-CM | POA: Diagnosis not present

## 2023-07-22 DIAGNOSIS — F419 Anxiety disorder, unspecified: Secondary | ICD-10-CM

## 2023-07-22 NOTE — Patient Instructions (Signed)
 Weight Management(overweight now) Significant weight loss from 204 lbs to 169 lbs over the past 6 months with the use of Wegovy . No adverse side effects reported. Discussed the possibility of reducing the dose if appetite suppression becomes too significant. -Continue Wegovy  as prescribed. -Consider dose reduction if appetite suppression becomes too significant.  Onychomycosis (Suspected) Discoloration and thickening of the right second toenail. No other nails affected. Discussed the possibility of fungal infection and the treatment options. -Obtain nail clipping for KOH prep. -Order metabolic panel to check liver enzymes in anticipation of possible antifungal treatment.  Anxiety Reports ongoing anxiety, managed with hydroxyzine  and occasional use of Xanax . No significant side effects reported. -Continue hydroxyzine  as needed for anxiety. -Use Xanax  as a backup if needed.  Follow up 4 months or sooner if needed

## 2023-07-22 NOTE — Progress Notes (Signed)
 Subjective:    Patient ID: Brandi Lutz, female    DOB: 02-28-1976, 48 y.o.   MRN: [id number]  HPI  Discussed the use of AI scribe software for clinical note transcription with the patient, who gave verbal consent to proceed.  History of Present Illness   Brandi Lutz is a 48 year old female who presents for weight management and toenail discoloration. [name] accompanied by her mother, who assists with meal preparation.  She has experienced significant weight loss over the past six months, initially weighing 204 pounds and now weighing 169 pounds with clothes and 165 pounds without clothes. She has been using Wegovy  during this period and denies any adverse side effects such as nausea, vomiting, or severe stomach pain. She has a history of colon polyps and colon cancer, which makes her vigilant about bowel regularity. Occasionally, she delays her weekly Wegovy  injection if she feels she is not eating enough. Her BMI has decreased from 31 to 25, and she is conscious of maintaining a healthy weight without appearing unhealthy. She has made dietary changes, including reducing portion sizes and consuming more fresh fruits and vegetables, with the support of her mother who lives with her. No nausea, vomiting, or severe stomach pain. Regular bowel movements.  She has a discolored and thickened toenail on her rt second toe, which has been present for a couple of months. The discoloration was noticed after removing gel nail polish, and a nail technician suggested it might be a fungal infection. This is the only affected nail.  She experiences anxiety related to work and life in the [place] , which has improved with the use of hydroxyzine . She takes hydroxyzine  in the morning when feeling nervous, which makes her drowsy but is balanced with coffee. She has Xanax  as a backup but primarily relies on hydroxyzine .          Review of Systems  Constitutional:  Negative for chills, fatigue and fever.   Respiratory:  Negative for cough, chest tightness, shortness of breath and wheezing.   Cardiovascular:  Negative for chest pain and palpitations.  Gastrointestinal:  Negative for abdominal pain.  Genitourinary:  Negative for dysuria and flank pain.  Musculoskeletal:  Negative for back pain and joint swelling.  Skin:  Negative for rash.  Neurological:  Negative for dizziness, numbness and headaches.  Hematological:  Negative for adenopathy. Does not bruise/bleed easily.  Psychiatric/Behavioral:  Negative for agitation, confusion, sleep disturbance and suicidal ideas. The patient is nervous/anxious.     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     Social History   Socioeconomic History   Marital status: Single    Spouse name: Not on file   Number of children: Not on file   Years of education: Not on file   Highest  education level: Some college, no degree  Occupational History   Not on file  Tobacco Use   Smoking status: Never   Smokeless tobacco: Never  Vaping Use   Vaping status: Never Used  Substance and Sexual Activity   Alcohol use: Yes    Alcohol/week: 0.0 standard drinks of alcohol    Comment: rarely   Drug use: No   Sexual activity: Not Currently  Other Topics Concern   Not on file  Social History Narrative   Not on file   Social Drivers of Health   Financial Resource Strain: Medium  Risk (07/21/2023)   Overall Financial Resource Strain (CARDIA)    Difficulty of Paying Living Expenses: Somewhat hard  Food Insecurity: No Food Insecurity (07/21/2023)   Hunger Vital Sign    Worried About Running Out of Food in the Last Year: Never true    Ran Out of Food in the Last Year: Never true  Transportation Needs: No Transportation Needs (07/21/2023)   PRAPARE - Administrator, Civil Service (Medical): No    Lack of Transportation (Non-Medical): No  Physical Activity: Insufficiently Active (07/21/2023)   Exercise Vital Sign    Days of Exercise per Week: 3 days    Minutes of Exercise per Session: 30 min  Stress: Stress Concern Present (07/21/2023)   [employer] of Occupational Health - Occupational Stress Questionnaire    Feeling of Stress : To some extent  Social Connections: Moderately Isolated (07/21/2023)   Social Connection and Isolation Panel [NHANES]    Frequency of Communication with Friends and Family: More than three times a week    Frequency of Social Gatherings with Friends and Family: Twice a week    Attends Religious Services: 1 to 4 times per year    Active Member of [employer] or Organizations: No    Attends Engineer, Structural: Not on file    Marital Status: Never married  Intimate Partner Violence: Not on file    Past Surgical History:  Procedure Laterality Date   APPENDECTOMY     ARTHROSCOPIC REPAIR ACL Left 20 yrs ago   CHOLECYSTECTOMY     COLECTOMY     COLONOSCOPY     GASTRIC BYPASS     herniated bowel repair     KNEE ARTHROSCOPY Left    PARTIAL KNEE ARTHROPLASTY Left 08/05/2014   Procedure: LEFT UNICOMPARTMENTAL KNEE;  Surgeon: Kay Ozell Cummins, MD;  Location: [facility] OR;  Service: Orthopedics;  Laterality: Left;   TOTAL KNEE ARTHROPLASTY Left 01/29/2019   TOTAL KNEE REVISION Left 01/29/2019   Procedure: CONVERSION OF LEFT UNICOMPARTMENTAL KNEE ARTHROPLASTY TO LEFT TOTAL KNEE ARTHROPLASTY;  Surgeon: Cummins Kay HERO, MD;  Location: [facility] OR;   Service: Orthopedics;  Laterality: Left;    Family History  Problem Relation Age of Onset   Hypertension Father    Diabetes Mother    Arthritis/Rheumatoid Mother    Osteoporosis Mother     Allergies  Allergen Reactions   Tape Rash    Current Outpatient Medications on File Prior to Visit  Medication Sig Dispense Refill   ALPRAZolam  (XANAX ) 0.5 MG tablet TAKE 1/2-1 TAB TWICE A DAY AS NEEDED FOR ANXIETY 24 tablet 3   buPROPion  (WELLBUTRIN  XL) 150 MG 24 hr tablet TAKE 1 TABLET BY MOUTH EVERY DAY 90 tablet 1   cyanocobalamin  1000 MCG tablet Take 1,000 mcg by mouth daily.     hydrOXYzine  (ATARAX ) 10 MG tablet TAKE 1-2 TABLETS DAILY AS NEEDED FOR ANXIETY 20 tablet 0  Semaglutide -Weight Management (WEGOVY ) 2.4 MG/0.75ML SOAJ INJECT 2.4 MG INTO THE SKIN ONCE A WEEK. 0.75 mL 3   sertraline  (ZOLOFT ) 50 MG tablet TAKE 1 TABLET BY MOUTH EVERY DAY 90 tablet 1   traMADol  (ULTRAM ) 50 MG tablet Take 1-2 tablets (50-100 mg total) by mouth daily as needed. 20 tablet 0   No current facility-administered medications on file prior to visit.    BP 122/72   Pulse 94   Resp 18   Ht 5' 8 (1.727 m)   Wt 169 lb (76.7 kg)   SpO2 100%   BMI 25.70 kg/m        Objective:   Physical Exam  General Mental Status- Alert. General Appearance- Not in acute distress.   Skin General: Color- Normal Color. Moisture- Normal Moisture.  Neck  No JVD.  Chest and Lung Exam Auscultation: Breath Sounds:-Normal.  Cardiovascular Auscultation:Rythm- Regular. Murmurs & Other Heart Sounds:Auscultation of the heart reveals- No Murmurs.  Abdomen Inspection:-Inspeection Normal. Palpation/Percussion:Note:No mass. Palpation and Percussion of the abdomen reveal- Non Tender, Non Distended + BS, no rebound or guarding.   Neurologic Cranial Nerve exam:- CN III-XII intact(No nystagmus), symmetric smile. Strength:- 5/5 equal and symmetric strength both upper and lower extremities.   Rt foot- 2nd toe has very  thick  chalky appearance. Appear like onychomycosis. Other nails repoted not to have dystrophic features though she does have press on/gel nails over other nails.    Assessment & Plan:   Patient Instructions  Weight Management(overweight now) Significant weight loss from 204 lbs to 169 lbs over the past 6 months with the use of Wegovy . No adverse side effects reported. Discussed the possibility of reducing the dose if appetite suppression becomes too significant. -Continue Wegovy  as prescribed. -Consider dose reduction if appetite suppression becomes too significant.  Onychomycosis (Suspected) Discoloration and thickening of the right second toenail. No other nails affected. Discussed the possibility of fungal infection and the treatment options. -Obtain nail clipping for KOH prep. -Order metabolic panel to check liver enzymes in anticipation of possible antifungal treatment.  Anxiety Reports ongoing anxiety, managed with hydroxyzine  and occasional use of Xanax . No significant side effects reported. -Continue hydroxyzine  as needed for anxiety. -Use Xanax  as a backup if needed.  Follow up 4 months or sooner if needed

## 2023-07-23 ENCOUNTER — Encounter: Payer: Self-pay | Admitting: Medical

## 2023-07-23 LAB — COMPREHENSIVE METABOLIC PANEL
AG Ratio: 1.8 (calc) (ref 1.0–2.5)
ALT: 15 U/L (ref 6–29)
AST: 16 U/L (ref 10–35)
Albumin: 4.2 g/dL (ref 3.6–5.1)
Alkaline phosphatase (APISO): 73 U/L (ref 31–125)
BUN/Creatinine Ratio: 7 (calc) (ref 6–22)
BUN: 6 mg/dL — ABNORMAL LOW (ref 7–25)
CO2: 29 mmol/L (ref 20–32)
Calcium: 9.3 mg/dL (ref 8.6–10.2)
Chloride: 103 mmol/L (ref 98–110)
Creat: 0.86 mg/dL (ref 0.50–0.99)
Globulin: 2.3 g/dL (ref 1.9–3.7)
Glucose, Bld: 77 mg/dL (ref 65–99)
Potassium: 4.1 mmol/L (ref 3.5–5.3)
Sodium: 140 mmol/L (ref 135–146)
Total Bilirubin: 0.6 mg/dL (ref 0.2–1.2)
Total Protein: 6.5 g/dL (ref 6.1–8.1)

## 2023-07-25 LAB — FUNGAL STAIN
FUNGAL SMEAR:: NONE SEEN
MICRO NUMBER:: 16056770
SPECIMEN QUALITY:: ADEQUATE

## 2023-07-26 NOTE — Addendum Note (Signed)
Addended by: Gwenevere Abbot on: 07/26/2023 05:30 PM   Modules accepted: Orders

## 2023-08-03 ENCOUNTER — Other Ambulatory Visit: Payer: Self-pay | Admitting: Medical

## 2023-08-17 ENCOUNTER — Other Ambulatory Visit: Payer: Self-pay | Admitting: Medical

## 2023-08-24 ENCOUNTER — Encounter: Payer: Self-pay | Admitting: Medical

## 2023-08-25 MED ORDER — WEGOVY 1.7 MG/0.75ML ~~LOC~~ SOAJ: 1.7000 mg | SUBCUTANEOUS | 0 refills | Status: DC

## 2023-08-31 ENCOUNTER — Other Ambulatory Visit: Payer: Self-pay | Admitting: Medical

## 2023-09-02 ENCOUNTER — Other Ambulatory Visit: Payer: No Typology Code available for payment source

## 2023-09-02 ENCOUNTER — Ambulatory Visit: Payer: No Typology Code available for payment source | Admitting: Family

## 2023-09-27 ENCOUNTER — Other Ambulatory Visit: Payer: Self-pay | Admitting: Medical

## 2023-09-27 NOTE — Telephone Encounter (Signed)
 Requesting: xanax Contract:05/10/23 UDS:05/05/23 Last Visit:07/22/23 Next Visit:n/a Last Refill:06/06/23  Please Advise

## 2023-09-28 NOTE — Telephone Encounter (Signed)
 Refills sent to pharmacy. She need controlled med visit next month before next refills can be sent.

## 2023-09-28 NOTE — Telephone Encounter (Signed)
Pt notified via my chart to schedule.

## 2023-10-26 ENCOUNTER — Other Ambulatory Visit: Payer: Self-pay | Admitting: Medical

## 2023-10-27 NOTE — Telephone Encounter (Signed)
 Requesting: Xanax  0.5 mg Contract: 05/05/2023 UDS: 05/05/2023 Last Visit: 07/22/2023 Next Visit: 02/28/2024 Last Refill: 09/28/2023  Please Advise

## 2023-11-15 ENCOUNTER — Inpatient Hospital Stay: Admitting: Family

## 2023-11-15 ENCOUNTER — Inpatient Hospital Stay: Attending: Hematology & Oncology

## 2023-11-15 ENCOUNTER — Encounter: Payer: Self-pay | Admitting: Family

## 2023-11-15 VITALS — BP 122/84 | HR 84 | Temp 97.8°F | Resp 17 | Wt 158.0 lb

## 2023-11-15 DIAGNOSIS — K909 Intestinal malabsorption, unspecified: Secondary | ICD-10-CM | POA: Diagnosis not present

## 2023-11-15 DIAGNOSIS — D508 Other iron deficiency anemias: Secondary | ICD-10-CM

## 2023-11-15 DIAGNOSIS — D509 Iron deficiency anemia, unspecified: Secondary | ICD-10-CM | POA: Diagnosis present

## 2023-11-15 DIAGNOSIS — K912 Postsurgical malabsorption, not elsewhere classified: Secondary | ICD-10-CM | POA: Insufficient documentation

## 2023-11-15 DIAGNOSIS — D510 Vitamin B12 deficiency anemia due to intrinsic factor deficiency: Secondary | ICD-10-CM | POA: Insufficient documentation

## 2023-11-15 DIAGNOSIS — Z9884 Bariatric surgery status: Secondary | ICD-10-CM | POA: Insufficient documentation

## 2023-11-15 LAB — CBC WITH DIFFERENTIAL (CANCER CENTER ONLY)
Abs Immature Granulocytes: 0.01 10*3/uL (ref 0.00–0.07)
Basophils Absolute: 0 10*3/uL (ref 0.0–0.1)
Basophils Relative: 1 %
Eosinophils Absolute: 0 10*3/uL (ref 0.0–0.5)
Eosinophils Relative: 1 %
HCT: 35.1 % — ABNORMAL LOW (ref 36.0–46.0)
Hemoglobin: 11.5 g/dL — ABNORMAL LOW (ref 12.0–15.0)
Immature Granulocytes: 0 %
Lymphocytes Relative: 33 %
Lymphs Abs: 1.5 10*3/uL (ref 0.7–4.0)
MCH: 26.1 pg (ref 26.0–34.0)
MCHC: 32.8 g/dL (ref 30.0–36.0)
MCV: 79.6 fL — ABNORMAL LOW (ref 80.0–100.0)
Monocytes Absolute: 0.5 10*3/uL (ref 0.1–1.0)
Monocytes Relative: 10 %
Neutro Abs: 2.6 10*3/uL (ref 1.7–7.7)
Neutrophils Relative %: 55 %
Platelet Count: 327 10*3/uL (ref 150–400)
RBC: 4.41 MIL/uL (ref 3.87–5.11)
RDW: 13.6 % (ref 11.5–15.5)
WBC Count: 4.6 10*3/uL (ref 4.0–10.5)
nRBC: 0 % (ref 0.0–0.2)

## 2023-11-15 LAB — RETICULOCYTES
Immature Retic Fract: 7.8 % (ref 2.3–15.9)
RBC.: 4.39 MIL/uL (ref 3.87–5.11)
Retic Count, Absolute: 44.3 10*3/uL (ref 19.0–186.0)
Retic Ct Pct: 1 % (ref 0.4–3.1)

## 2023-11-15 LAB — FERRITIN: Ferritin: 14 ng/mL (ref 11–307)

## 2023-11-15 NOTE — Progress Notes (Signed)
 Hematology and Oncology Follow Up Visit  Brandi Lutz [id number] Dec 29, 1975 48 y.o. 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 She has noted an increase in fatigue and states that she has also been craving ice.  No blood loss noted. No bruising or petechiae.  No fever, chills, n/v, cough, rash, dizziness, SOB, chest pain, palpitations, abdominal pain or changes in bowel or bladder habits.  No swelling, tenderness, numbness or tingling in her extremities.  No falls or syncope reported.  Appetite and hydration are good. She states that she has tolerated Wegovy  nicely.  Weight is 158 lbs.   ECOG Performance Status: 1 - Symptomatic but completely ambulatory  Medications:  Allergies as of 11/15/2023       Reactions   Tape Rash        Medication List        Accurate as of November 15, 2023  2:58 PM. If you have any questions, ask your nurse or doctor.          ALPRAZolam  0.5 MG tablet Commonly known as: XANAX  TAKE 1/2-1 TAB TWICE A DAY AS NEEDED FOR ANXIETY   buPROPion  150 MG 24 hr tablet Commonly known as: WELLBUTRIN  XL TAKE 1 TABLET BY MOUTH EVERY DAY   cyanocobalamin  1000 MCG tablet Take 1,000 mcg by mouth daily.   hydrOXYzine  10 MG tablet Commonly known as: ATARAX  TAKE 1-2 TABLETS DAILY AS NEEDED FOR ANXIETY   sertraline  50 MG tablet Commonly known as: ZOLOFT  TAKE 1 TABLET BY MOUTH EVERY DAY   traMADol  50 MG tablet Commonly known as: ULTRAM  Take 1-2 tablets (50-100 mg total) by mouth daily as needed.   Wegovy  1.7 MG/0.75ML Soaj Generic drug: Semaglutide -Weight Management Inject 1.7 mg into the skin once a week.        Allergies:  Allergies  Allergen Reactions   Tape Rash    Past Medical History, Surgical  history, Social history, and Family History were reviewed and updated.  Review of Systems: All other 10 point review of systems is negative.   Physical Exam:  weight is 158 lb (71.7 kg). Her oral temperature is 97.8 F (36.6 C). Her blood pressure is 122/84 and her pulse is 84. Her respiration is 17 and oxygen saturation is 100%.   Wt Readings from Last 3 Encounters:  11/15/23 158 lb (71.7 kg)  07/22/23 169 lb (76.7 kg)  03/10/23 187 lb (84.8 kg)    Ocular: Sclerae unicteric, pupils equal, round and reactive to light Ear-nose-throat: Oropharynx clear, dentition fair Lymphatic: No cervical or supraclavicular adenopathy Lungs no rales or rhonchi, good excursion bilaterally Heart regular rate and rhythm, no murmur appreciated Abd soft, nontender, positive bowel sounds MSK no focal spinal tenderness, no joint edema Neuro: non-focal, well-oriented, appropriate affect Breasts: Deferred   Lab Results  Component Value Date   WBC 4.6 11/15/2023   HGB 11.5 (L) 11/15/2023   HCT 35.1 (L) 11/15/2023   MCV 79.6 (L) 11/15/2023   PLT 327 11/15/2023   Lab Results  Component Value Date   FERRITIN 27 02/21/2023   IRON  78 02/21/2023   TIBC 358 02/21/2023   UIBC 280 02/21/2023   IRONPCTSAT 22 02/21/2023   Lab Results  Component Value Date   RETICCTPCT 1.0  11/15/2023   RBC 4.41 11/15/2023   RBC 4.39 11/15/2023   RETICCTABS 31.3 05/02/2015   No results found for: "KPAFRELGTCHN", "LAMBDASER", "KAPLAMBRATIO" No results found for: "IGGSERUM", "IGA", "IGMSERUM" No results found for: "TOTALPROTELP", "ALBUMINELP", "A1GS", "A2GS", "BETS", "BETA2SER", "GAMS", "MSPIKE", "SPEI"   Chemistry      Component Value Date/Time   NA 140 07/22/2023 1525   NA 141 04/16/2022 1529   K 4.1 07/22/2023 1525   CL 103 07/22/2023 1525   CO2 29 07/22/2023 1525   BUN 6 (L) 07/22/2023 1525   BUN 8 04/16/2022 1529   CREATININE 0.86 07/22/2023 1525      Component Value Date/Time   CALCIUM 9.3 07/22/2023 1525    ALKPHOS 72 01/03/2023 1355   AST 16 07/22/2023 1525   AST 43 (H) 09/16/2020 1343   ALT 15 07/22/2023 1525   ALT 70 (H) 09/16/2020 1343   BILITOT 0.6 07/22/2023 1525   BILITOT 0.3 04/16/2022 1529   BILITOT 0.4 09/16/2020 1343       Impression and Plan: Brandi Lutz is a very pleasant 48 yo African American female with iron  deficiency anemia secondary to malabsorption after gastric bypass.  Iron  studies are pending.  Follow-up in 6 months.   Kennard Pea, NP 6/3/20252:58 PM

## 2023-11-16 LAB — IRON AND IRON BINDING CAPACITY (CC-WL,HP ONLY)
Iron: 43 ug/dL (ref 28–170)
Saturation Ratios: 9 % — ABNORMAL LOW (ref 10.4–31.8)
TIBC: 456 ug/dL — ABNORMAL HIGH (ref 250–450)
UIBC: 413 ug/dL (ref 148–442)

## 2023-11-23 ENCOUNTER — Inpatient Hospital Stay

## 2023-11-23 VITALS — BP 105/68 | HR 83 | Temp 97.8°F | Resp 16

## 2023-11-23 DIAGNOSIS — D509 Iron deficiency anemia, unspecified: Secondary | ICD-10-CM | POA: Diagnosis not present

## 2023-11-23 DIAGNOSIS — Z9884 Bariatric surgery status: Secondary | ICD-10-CM

## 2023-11-23 DIAGNOSIS — K909 Intestinal malabsorption, unspecified: Secondary | ICD-10-CM

## 2023-11-23 DIAGNOSIS — D508 Other iron deficiency anemias: Secondary | ICD-10-CM

## 2023-11-23 MED ORDER — SODIUM CHLORIDE 0.9 % IV SOLN: Freq: Once | INTRAVENOUS | Status: AC

## 2023-11-23 MED ORDER — SODIUM CHLORIDE 0.9 % IV SOLN
300.0000 mg | Freq: Once | INTRAVENOUS | Status: AC
  Administered 2023-11-23: 300 mg via INTRAVENOUS
  Filled 2023-11-23: qty 300

## 2023-11-23 NOTE — Patient Instructions (Signed)

## 2023-11-25 ENCOUNTER — Inpatient Hospital Stay

## 2023-11-27 ENCOUNTER — Other Ambulatory Visit: Payer: Self-pay | Admitting: Medical

## 2023-11-27 ENCOUNTER — Other Ambulatory Visit: Payer: Self-pay | Admitting: Family

## 2023-12-02 ENCOUNTER — Inpatient Hospital Stay

## 2023-12-02 VITALS — BP 128/84 | HR 77 | Temp 97.7°F | Resp 17

## 2023-12-02 DIAGNOSIS — D509 Iron deficiency anemia, unspecified: Secondary | ICD-10-CM | POA: Diagnosis not present

## 2023-12-02 DIAGNOSIS — Z9884 Bariatric surgery status: Secondary | ICD-10-CM

## 2023-12-02 DIAGNOSIS — D508 Other iron deficiency anemias: Secondary | ICD-10-CM

## 2023-12-02 DIAGNOSIS — K909 Intestinal malabsorption, unspecified: Secondary | ICD-10-CM

## 2023-12-02 MED ORDER — SODIUM CHLORIDE 0.9 % IV SOLN: Freq: Once | INTRAVENOUS | Status: AC

## 2023-12-02 MED ORDER — SODIUM CHLORIDE 0.9 % IV SOLN
300.0000 mg | Freq: Once | INTRAVENOUS | Status: AC
  Administered 2023-12-02: 300 mg via INTRAVENOUS
  Filled 2023-12-02: qty 300

## 2023-12-02 NOTE — Patient Instructions (Signed)

## 2023-12-05 ENCOUNTER — Telehealth: Payer: Self-pay

## 2023-12-05 ENCOUNTER — Other Ambulatory Visit (HOSPITAL_COMMUNITY): Payer: Self-pay

## 2023-12-05 NOTE — Telephone Encounter (Signed)
 Pharmacy Patient Advocate Encounter   Received notification from CoverMyMeds that prior authorization for Wegovy  2.4is required/requested.   Insurance verification completed.   The patient is insured through [facility] .   Per test claim: patient has current prior authorization that expires 01/03/24. Unable to test bill because patient plan not contracted with [facility].

## 2023-12-09 ENCOUNTER — Inpatient Hospital Stay

## 2023-12-09 VITALS — BP 111/69 | HR 77 | Temp 98.0°F | Resp 19

## 2023-12-09 DIAGNOSIS — K909 Intestinal malabsorption, unspecified: Secondary | ICD-10-CM

## 2023-12-09 DIAGNOSIS — D508 Other iron deficiency anemias: Secondary | ICD-10-CM

## 2023-12-09 DIAGNOSIS — Z9884 Bariatric surgery status: Secondary | ICD-10-CM

## 2023-12-09 DIAGNOSIS — D509 Iron deficiency anemia, unspecified: Secondary | ICD-10-CM | POA: Diagnosis not present

## 2023-12-09 MED ORDER — SODIUM CHLORIDE 0.9 % IV SOLN
Freq: Once | INTRAVENOUS | Status: AC
Start: 2023-12-09 — End: 2023-12-09

## 2023-12-09 MED ORDER — SODIUM CHLORIDE 0.9 % IV SOLN
300.0000 mg | Freq: Once | INTRAVENOUS | Status: AC
  Administered 2023-12-09: 300 mg via INTRAVENOUS
  Filled 2023-12-09: qty 300

## 2023-12-09 NOTE — Patient Instructions (Signed)

## 2023-12-19 ENCOUNTER — Ambulatory Visit: Admitting: Medical

## 2023-12-19 VITALS — BP 124/70 | HR 78 | Temp 97.7°F | Resp 17 | Ht 68.0 in | Wt 158.2 lb

## 2023-12-19 DIAGNOSIS — E663 Overweight: Secondary | ICD-10-CM

## 2023-12-19 DIAGNOSIS — F329 Major depressive disorder, single episode, unspecified: Secondary | ICD-10-CM

## 2023-12-19 DIAGNOSIS — Z23 Encounter for immunization: Secondary | ICD-10-CM

## 2023-12-19 DIAGNOSIS — F419 Anxiety disorder, unspecified: Secondary | ICD-10-CM | POA: Diagnosis not present

## 2023-12-19 DIAGNOSIS — L299 Pruritus, unspecified: Secondary | ICD-10-CM | POA: Diagnosis not present

## 2023-12-19 MED ORDER — HYDROXYZINE HCL 10 MG PO TABS: ORAL_TABLET | ORAL | 0 refills | Status: DC

## 2023-12-19 MED ORDER — WEGOVY 1 MG/0.5ML ~~LOC~~ SOAJ: SUBCUTANEOUS | 0 refills | Status: DC

## 2023-12-19 MED ORDER — ALPRAZOLAM 0.5 MG PO TABS: ORAL_TABLET | ORAL | 0 refills | Status: DC

## 2023-12-19 NOTE — Progress Notes (Signed)
 Subjective:    Patient ID: Brandi Lutz, female    DOB: 06/30/1975, 48 y.o.   MRN: [id number]  HPI Brandi Lutz is a 48 year old female who presents for weight management and medication review.  She has been managing her weight with Wegovy , initially reducing from 204 pounds to 169 pounds, and now down to 158 lb. [name] focused on maintaining her current weight and not losing more. She has been using Wegovy  injections, initially at 2.4 mg, but has transitioned to 1.7 mg. She administers the injection weekly, typically on Fridays, but occasionally skips a dose if she feels she is not eating enough. She anticipates running out of the 1.7 mg dose by early August.   Her current diet includes small, consistent meals, as advised by her dietitian, to maintain her weight between 150 and 155 pounds. She reports a BMI of 24 and is conscious of her nutritional intake, sometimes struggling with appetite, stating 'I'll have the food in front of me and I won't feel hungry, but I know I need to eat.'  She experiences itching primarily after hot showers, which is alleviated by moisturizing immediately. She uses hydroxyzine  occasionally for itching, about two to three times a week, and finds it effective.   She also uses Xanax  sparingly for anxiety, with a current supply of about two and a half pills remaining. No issues with insomnia. usually 24 tab will last for numerous months.  She has a history of depression, managed with Wellbutrin  150 mg daily and sertraline . She has been stable on these medications and reports they help maintain her mood. She mentions having had chickenpox at age 9.   Review of Systems  Constitutional:  Negative for chills, fatigue and fever.  HENT:  Negative for congestion.   Respiratory:  Negative for cough, chest tightness, shortness of breath and wheezing.   Cardiovascular:  Negative for chest pain and palpitations.  Gastrointestinal:  Negative for abdominal distention.   Genitourinary:  Negative for difficulty urinating, enuresis, frequency and hematuria.  Musculoskeletal:  Negative for back pain, joint swelling and neck stiffness.  Skin:  Negative for rash.  Neurological:  Negative for dizziness, syncope, weakness, numbness and headaches.  Hematological:  Negative for adenopathy. Does not bruise/bleed easily.  Psychiatric/Behavioral:  Negative for behavioral problems and confusion.     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     Social History   Socioeconomic History   Marital status: Single    Spouse name: Not on file   Number of children: Not on file   Years of education: Not on file   Highest education level: Some college, no degree  Occupational History   Not on file  Tobacco Use   Smoking status: Never   Smokeless tobacco: Never  Vaping  Use   Vaping status: Never Used  Substance and Sexual Activity   Alcohol use: Yes    Alcohol/week: 0.0 standard drinks of alcohol    Comment: rarely   Drug use: No   Sexual activity: Not Currently  Other Topics Concern   Not on file  Social History Narrative   Not on file   Social Drivers of Health   Financial Resource Strain: Low Risk  (12/12/2023)   Overall Financial Resource Strain (CARDIA)    Difficulty of Paying Living Expenses: Not very hard  Food Insecurity: No Food Insecurity (12/12/2023)   Hunger Vital Sign     Worried About Running Out of Food in the Last Year: Never true    Ran Out of Food in the Last Year: Never true  Transportation Needs: No Transportation Needs (12/12/2023)   PRAPARE - Administrator, Civil Service (Medical): No    Lack of Transportation (Non-Medical): No  Physical Activity: Insufficiently Active (12/12/2023)   Exercise Vital Sign    Days of Exercise per Week: 3 days    Minutes of Exercise per Session: 20 min  Stress: Stress Concern Present (12/12/2023)   [employer] of Occupational Health - Occupational Stress Questionnaire    Feeling of Stress: Rather much  Social Connections: Socially Isolated (12/12/2023)   Social Connection and Isolation Panel    Frequency of Communication with Friends and Family: More than three times a week    Frequency of Social Gatherings with Friends and Family: Once a week    Attends Religious Services: Never    Database administrator or Organizations: No    Attends Engineer, structural: Not on file    Marital Status: Never married  Intimate Partner Violence: Not on file    Past Surgical History:  Procedure Laterality Date   APPENDECTOMY     ARTHROSCOPIC REPAIR ACL Left 20 yrs ago   CHOLECYSTECTOMY     COLECTOMY     COLONOSCOPY     GASTRIC BYPASS     herniated bowel repair     KNEE ARTHROSCOPY Left    PARTIAL KNEE ARTHROPLASTY Left 08/05/2014   Procedure: LEFT UNICOMPARTMENTAL KNEE;  Surgeon: Kay Ozell Cummins, MD;  Location: [facility] OR;  Service: Orthopedics;  Laterality: Left;   TOTAL KNEE ARTHROPLASTY Left 01/29/2019   TOTAL KNEE REVISION Left 01/29/2019   Procedure: CONVERSION OF LEFT UNICOMPARTMENTAL KNEE ARTHROPLASTY TO LEFT TOTAL KNEE ARTHROPLASTY;  Surgeon: Cummins Kay HERO, MD;  Location: [facility] OR;  Service: Orthopedics;  Laterality: Left;    Family History  Problem Relation Age of Onset   Hypertension Father    Diabetes Mother    Arthritis/Rheumatoid Mother    Osteoporosis Mother     Allergies  Allergen  Reactions   Tape Rash    Current Outpatient Medications on File Prior to Visit  Medication Sig Dispense Refill   buPROPion  (WELLBUTRIN  XL) 150 MG 24 hr tablet TAKE 1 TABLET BY MOUTH EVERY DAY 90 tablet 1   cyanocobalamin  1000 MCG tablet Take 1,000 mcg by mouth daily.     sertraline  (ZOLOFT ) 50 MG tablet TAKE 1 TABLET BY MOUTH EVERY DAY 90 tablet 1   traMADol  (ULTRAM ) 50 MG tablet Take 1-2 tablets (50-100 mg total) by mouth daily as needed. 20 tablet 0   No current facility-administered medications on file prior to visit.    BP 124/70 (BP Location: Left Arm, Patient Position: Sitting, Cuff Size: Small)   Pulse 78   Temp 97.7 F (  36.5 C) (Oral)   Resp 17   Ht 5' 8 (1.727 m)   Wt 158 lb 3.2 oz (71.8 kg)   LMP 11/27/2023 (Exact Date)   SpO2 98%   BMI 24.05 kg/m        Objective:   Physical Exam  General Mental Status- Alert. General Appearance- Not in acute distress.   Skin General: Color- Normal Color. Moisture- Normal Moisture.  Neck Carotid Arteries- Normal color. Moisture- Normal Moisture. No carotid bruits. No JVD.  Chest and Lung Exam Auscultation: Breath Sounds:-CTA  Cardiovascular Auscultation:Rythm- RRR Murmurs & Other Heart Sounds:Auscultation of the heart reveals- No Murmurs.  Abdomen Inspection:-Inspeection Normal. Palpation/Percussion:Note:No mass. Palpation and Percussion of the abdomen reveal- Non Tender, Non Distended + BS, no rebound or guarding.   Neurologic Cranial Nerve exam:- CN III-XII intact(No nystagmus), symmetric smile. Strength:- 5/5 equal and symmetric strength both upper and lower extremities.       Assessment & Plan:   Patient Instructions  Weight management(hx of obesity and now in normal bmi range) Weight reduced to 158 pounds with Wegovy , BMI now 24. Focus on maintenance. - Prescribe Wegovy  1 mg weekly injection. - Request weight and appetite update via MyChart in three weeks.  Anxiety Anxiety managed with Xanax  for  panic attacks and hydroxyzine  for anxiety or itching. Compliant with medication contract. - Refill Xanax  with 24 tablets. - Continue hydroxyzine  as needed. -up to date on contract and uds  Depression Depression stable with Wellbutrin  150 mg daily and sertraline . - Continue Wellbutrin  150 mg daily and sertraline   50 mg daily  General Health Maintenance Due for wellness exam and Shingrix  vaccine . - Administer first dose of Shingrix  vaccine today. - Schedule wellness exam in September or October with fasting labs - Administer second dose of Shingrix  vaccine during wellness exam.  Follow wellness exam september or october   [place] Maxwell, PA-C

## 2023-12-19 NOTE — Patient Instructions (Signed)
 Weight management(hx of obesity and now in normal bmi range) Weight reduced to 158 pounds with Wegovy , BMI now 24. Focus on maintenance. - Prescribe Wegovy  1 mg weekly injection. - Request weight and appetite update via MyChart in three weeks.  Anxiety Anxiety managed with Xanax  for panic attacks and hydroxyzine  for anxiety or itching. Compliant with medication contract. - Refill Xanax  with 24 tablets. - Continue hydroxyzine  as needed. -up to date on contract and uds  Depression Depression stable with Wellbutrin  150 mg daily and sertraline . - Continue Wellbutrin  150 mg daily and sertraline   50 mg daily  General Health Maintenance Due for wellness exam and Shingrix  vaccine . - Administer first dose of Shingrix  vaccine today. - Schedule wellness exam in September or October with fasting labs - Administer second dose of Shingrix  vaccine during wellness exam.  Follow wellness exam september or october

## 2024-01-04 ENCOUNTER — Other Ambulatory Visit: Payer: Self-pay | Admitting: Medical

## 2024-01-11 ENCOUNTER — Other Ambulatory Visit (HOSPITAL_COMMUNITY): Payer: Self-pay

## 2024-01-13 ENCOUNTER — Encounter: Payer: Self-pay | Admitting: Medical

## 2024-01-13 MED ORDER — SEMAGLUTIDE-WEIGHT MANAGEMENT 1.7 MG/0.75ML ~~LOC~~ SOAJ: 1.7000 mg | SUBCUTANEOUS | 0 refills | Status: DC

## 2024-01-13 NOTE — Addendum Note (Signed)
 Addended by: DORINA DALLAS HERO on: 01/13/2024 04:52 PM   Modules accepted: Orders

## 2024-01-16 ENCOUNTER — Encounter: Payer: Self-pay | Admitting: Family

## 2024-01-16 ENCOUNTER — Telehealth: Payer: Self-pay

## 2024-01-16 ENCOUNTER — Other Ambulatory Visit (HOSPITAL_COMMUNITY): Payer: Self-pay

## 2024-01-16 NOTE — Telephone Encounter (Signed)
 Pharmacy Patient Advocate Encounter  Received notification from [facility] that Prior Authorization for Wegovy  1.7 has been APPROVED from 01/16/24 to 01/15/25.  Unable to run test claim because patient plan is not contracted with [facility] pharmacies

## 2024-01-16 NOTE — Telephone Encounter (Signed)
 Pharmacy Patient Advocate Encounter   Received notification from CoverMyMeds that prior authorization for Wegovy  1.7 is required/requested.   Insurance verification completed.   The patient is insured through [facility] .   Per test claim: unable to run test claim. CMM Xzb:[id number]

## 2024-01-19 ENCOUNTER — Other Ambulatory Visit: Payer: Self-pay | Admitting: Medical

## 2024-01-19 NOTE — Telephone Encounter (Signed)
 Requesting: xanax  0.5mg  Contract: Yes 05/10/23 UDS: 05/05/23 Last Visit: 12/19/2023 Next Visit: 02/28/2024 Last Refill: 12/19/23  Please Advise

## 2024-01-20 NOTE — Telephone Encounter (Signed)
 Rx refill sent to pharmacy.

## 2024-02-16 ENCOUNTER — Other Ambulatory Visit: Payer: Self-pay | Admitting: Medical

## 2024-02-19 NOTE — Telephone Encounter (Signed)
 Rx refill sent to pharmacy.

## 2024-02-20 ENCOUNTER — Other Ambulatory Visit: Payer: Self-pay | Admitting: Medical

## 2024-02-28 ENCOUNTER — Ambulatory Visit: Admitting: Medical

## 2024-02-28 VITALS — BP 122/80 | HR 96 | Temp 97.8°F | Resp 17 | Ht 68.0 in | Wt 156.0 lb

## 2024-02-28 DIAGNOSIS — Z0184 Encounter for antibody response examination: Secondary | ICD-10-CM

## 2024-02-28 DIAGNOSIS — Z6823 Body mass index (BMI) 23.0-23.9, adult: Secondary | ICD-10-CM

## 2024-02-28 DIAGNOSIS — F419 Anxiety disorder, unspecified: Secondary | ICD-10-CM | POA: Diagnosis not present

## 2024-02-28 DIAGNOSIS — Z1159 Encounter for screening for other viral diseases: Secondary | ICD-10-CM | POA: Diagnosis not present

## 2024-02-28 DIAGNOSIS — Z23 Encounter for immunization: Secondary | ICD-10-CM

## 2024-02-28 NOTE — Progress Notes (Signed)
   Subjective:    Patient ID: Brandi Lutz, female    DOB: 03-17-1976, 48 y.o.   MRN: [id number]  HPI  [name] is a 48 year old female who presents for vaccine updates and management of anxiety.  [name] inquiring about the timing for her second shingles vaccine, having received the first dose on July 7th. [name] also interested in receiving the pneumonia vaccine, as she is now eligible at age 64, and the flu vaccine, as it is the appropriate season.  [name] currently on Wegovy  1.7 mg for weight management, which is effective without side effects such as nausea, vomiting, constipation, or stomach pain. She has experienced significant weight loss, requiring new clothing. She maintains her weight through regular walking and plans to incorporate resistance training.  She has a history of precancerous colon polyps, with a follow-up appointment scheduled with her gastroenterologist in December. Previous procedures included endoscopy and colonoscopy, with findings of an 8 mm polyp in the distal sigmoid and internal hemorrhoids.  She manages anxiety with sertraline  50 mg daily and Xanax  as needed, typically half a tablet in the morning to manage work-related stress. No depression, and her mood is stable with Wellbutrin  150 mg. She experiences morning anxiety but can generally sleep well.  Review of Systems See hpi    Objective:   Physical Exam  General- No acute distress. Pleasant patient. Neck- Full range of motion, no jvd Lungs- Clear, even and unlabored. Heart- regular rate and rhythm. Neurologic- CNII- XII grossly intact.       Assessment & Plan:   Patient Instructions  Immunization counseling and administration [name] due for the second Shingrix  dose, eligible for pneumonia vaccine, and due for influenza vaccine.  Hepatitis B and C tests future antibody testing planned. - Administer influenza and PCV20 vaccines today. - Schedule Shingrix  vaccine as a nurse visit in 1-3  weeks. - Order hepatitis B surface antibody test to assess immunity status. - Order hepatitis C antibody test.  Obesity, now resolved with normal BMI BMI is 23, indicating normal weight. Wegovy  1.7 mg effective without side effects. Approved through June 2026 maintenance dose. Maintains weight with exercise and dietitian support. - Continue Wegovy  1.7 mg as maintenance dose. - Encourage resistance training to maintain muscle mass.  Generalized anxiety disorder Anxiety managed with sertraline  50 mg daily and Xanax  as needed. Morning anxiety managed with half Xanax  tablet. Anxiety more prominent than depression. - Continue sertraline  50 mg daily. - Continue Xanax  as needed, with refills managed appropriately. - Schedule urine drug screen and sign contract during nurse visit.  History of major depressive disorder, in remission Depression well-controlled with Wellbutrin  150 mg. No episodes in past year. [name] aware of signs and will report changes. - Continue Wellbutrin  150 mg daily.  Follow up 6 months or sooner if needed    [employer], PA-C

## 2024-02-28 NOTE — Patient Instructions (Signed)
 Immunization counseling and administration [name] due for the second Shingrix  dose, eligible for pneumonia vaccine, and due for influenza vaccine.  Hepatitis B and C tests future antibody testing planned. - Administer influenza and PCV20 vaccines today. - Schedule Shingrix  vaccine as a nurse visit in 1-3 weeks. - Order hepatitis B surface antibody test to assess immunity status. - Order hepatitis C antibody test.  Obesity, now resolved with normal BMI BMI is 23, indicating normal weight. Wegovy  1.7 mg effective without side effects. Approved through June 2026 maintenance dose. Maintains weight with exercise and dietitian support. - Continue Wegovy  1.7 mg as maintenance dose. - Encourage resistance training to maintain muscle mass.  Generalized anxiety disorder Anxiety managed with sertraline  50 mg daily and Xanax  as needed. Morning anxiety managed with half Xanax  tablet. Anxiety more prominent than depression. - Continue sertraline  50 mg daily. - Continue Xanax  as needed, with refills managed appropriately. - Schedule urine drug screen and sign contract during nurse visit.  History of major depressive disorder, in remission Depression well-controlled with Wellbutrin  150 mg. No episodes in past year. [name] aware of signs and will report changes. - Continue Wellbutrin  150 mg daily.  Follow up 6 months or sooner if needed

## 2024-03-05 ENCOUNTER — Other Ambulatory Visit (HOSPITAL_BASED_OUTPATIENT_CLINIC_OR_DEPARTMENT_OTHER): Payer: Self-pay | Admitting: Medical

## 2024-03-05 DIAGNOSIS — Z1231 Encounter for screening mammogram for malignant neoplasm of breast: Secondary | ICD-10-CM

## 2024-03-17 ENCOUNTER — Other Ambulatory Visit: Payer: Self-pay | Admitting: Medical

## 2024-03-19 ENCOUNTER — Telehealth: Payer: Self-pay

## 2024-03-19 DIAGNOSIS — F419 Anxiety disorder, unspecified: Secondary | ICD-10-CM

## 2024-03-19 DIAGNOSIS — Z79899 Other long term (current) drug therapy: Secondary | ICD-10-CM

## 2024-03-19 NOTE — Telephone Encounter (Signed)
 error

## 2024-03-19 NOTE — Telephone Encounter (Signed)
 Called pt and got her scheduled for lab visit this wed. 03/21/24  Also she wants shingrix  vaccine but there was no available appointments.  Advised her to let front staff know that I can give shingrix  after lab visit

## 2024-03-20 NOTE — Telephone Encounter (Signed)
Rx refill sent to pt pharmacy 

## 2024-03-21 ENCOUNTER — Other Ambulatory Visit (INDEPENDENT_AMBULATORY_CARE_PROVIDER_SITE_OTHER)

## 2024-03-21 ENCOUNTER — Other Ambulatory Visit: Payer: Self-pay

## 2024-03-21 ENCOUNTER — Ambulatory Visit (INDEPENDENT_AMBULATORY_CARE_PROVIDER_SITE_OTHER)

## 2024-03-21 DIAGNOSIS — F419 Anxiety disorder, unspecified: Secondary | ICD-10-CM

## 2024-03-21 DIAGNOSIS — Z23 Encounter for immunization: Secondary | ICD-10-CM | POA: Diagnosis not present

## 2024-03-21 DIAGNOSIS — Z1159 Encounter for screening for other viral diseases: Secondary | ICD-10-CM | POA: Diagnosis not present

## 2024-03-21 DIAGNOSIS — Z0184 Encounter for antibody response examination: Secondary | ICD-10-CM

## 2024-03-21 DIAGNOSIS — Z79899 Other long term (current) drug therapy: Secondary | ICD-10-CM

## 2024-03-22 ENCOUNTER — Ambulatory Visit: Payer: Self-pay | Admitting: Medical

## 2024-03-22 LAB — HEPATITIS C ANTIBODY: Hepatitis C Ab: NONREACTIVE

## 2024-03-22 LAB — HEPATITIS B SURFACE ANTIBODY, QUANTITATIVE: Hep B S AB Quant (Post): 237 m[IU]/mL (ref >=10)

## 2024-03-24 LAB — DM TEMPLATE

## 2024-03-25 LAB — DRUG MONITORING PANEL 376104, URINE
Alphahydroxyalprazolam: 153 ng/mL — ABNORMAL HIGH (ref <25)
Alphahydroxymidazolam: NEGATIVE ng/mL (ref <50)
Alphahydroxytriazolam: NEGATIVE ng/mL (ref <50)
Aminoclonazepam: NEGATIVE ng/mL (ref <25)
Barbiturates: NEGATIVE ng/mL (ref <300)
Benzodiazepines: POSITIVE ng/mL — AB (ref <100)
Cocaine Metabolite: NEGATIVE ng/mL (ref <150)
Desmethyltramadol: NEGATIVE ng/mL (ref <100)
Hydroxyethylflurazepam: NEGATIVE ng/mL (ref <50)
Lorazepam: NEGATIVE ng/mL (ref <50)
Methamphetamine: NEGATIVE ng/mL (ref <250)
Nordiazepam: NEGATIVE ng/mL (ref <50)
Opiates: NEGATIVE ng/mL (ref <100)
Oxazepam: NEGATIVE ng/mL (ref <50)
Oxycodone: NEGATIVE ng/mL (ref <100)
Temazepam: NEGATIVE ng/mL (ref <50)
Tramadol: NEGATIVE ng/mL (ref <100)
Tramadol: NEGATIVE ng/mL (ref <500)

## 2024-03-25 LAB — DM TEMPLATE

## 2024-04-05 ENCOUNTER — Ambulatory Visit (HOSPITAL_BASED_OUTPATIENT_CLINIC_OR_DEPARTMENT_OTHER)
Admission: RE | Admit: 2024-04-05 | Discharge: 2024-04-05 | Disposition: A | Discharge status: Home / Self Care | Source: Ambulatory Visit | Attending: Medical | Admitting: Medical

## 2024-04-05 ENCOUNTER — Encounter (HOSPITAL_BASED_OUTPATIENT_CLINIC_OR_DEPARTMENT_OTHER): Payer: Self-pay

## 2024-04-05 DIAGNOSIS — Z1231 Encounter for screening mammogram for malignant neoplasm of breast: Secondary | ICD-10-CM | POA: Insufficient documentation

## 2024-04-11 ENCOUNTER — Other Ambulatory Visit: Payer: Self-pay | Admitting: Medical

## 2024-04-18 ENCOUNTER — Other Ambulatory Visit: Payer: Self-pay | Admitting: Medical

## 2024-04-18 NOTE — Telephone Encounter (Signed)
 Requesting: xanax  Contract: Yes 03/31/24 UDS: 03/29/24 Last Visit: 02/28/2024 Next Visit: Visit date not found Last Refill: 03/21/24  Please Advise

## 2024-04-18 NOTE — Telephone Encounter (Signed)
Rx refill sent to pt pharmacy 

## 2024-05-15 ENCOUNTER — Other Ambulatory Visit: Payer: Self-pay | Admitting: Medical

## 2024-05-16 ENCOUNTER — Inpatient Hospital Stay: Admitting: Family

## 2024-05-16 ENCOUNTER — Inpatient Hospital Stay: Attending: Family

## 2024-05-16 ENCOUNTER — Encounter: Payer: Self-pay | Admitting: Family

## 2024-05-16 VITALS — BP 127/80 | HR 80 | Temp 97.8°F | Resp 18 | Wt 156.0 lb

## 2024-05-16 DIAGNOSIS — D509 Iron deficiency anemia, unspecified: Secondary | ICD-10-CM | POA: Diagnosis present

## 2024-05-16 DIAGNOSIS — K912 Postsurgical malabsorption, not elsewhere classified: Secondary | ICD-10-CM | POA: Insufficient documentation

## 2024-05-16 DIAGNOSIS — K909 Intestinal malabsorption, unspecified: Secondary | ICD-10-CM

## 2024-05-16 DIAGNOSIS — D510 Vitamin B12 deficiency anemia due to intrinsic factor deficiency: Secondary | ICD-10-CM | POA: Insufficient documentation

## 2024-05-16 DIAGNOSIS — D508 Other iron deficiency anemias: Secondary | ICD-10-CM

## 2024-05-16 DIAGNOSIS — Z9884 Bariatric surgery status: Secondary | ICD-10-CM | POA: Diagnosis not present

## 2024-05-16 LAB — RETICULOCYTES
Immature Retic Fract: 4.4 % (ref 2.3–15.9)
RBC.: 4.58 MIL/uL (ref 3.87–5.11)
Retic Count, Absolute: 40.3 K/uL (ref 19.0–186.0)
Retic Ct Pct: 0.9 % (ref 0.4–3.1)

## 2024-05-16 LAB — CBC WITH DIFFERENTIAL (CANCER CENTER ONLY)
Abs Immature Granulocytes: 0.01 K/uL (ref 0.00–0.07)
Basophils Absolute: 0 K/uL (ref 0.0–0.1)
Basophils Relative: 1 %
Eosinophils Absolute: 0.1 K/uL (ref 0.0–0.5)
Eosinophils Relative: 2 %
HCT: 38 % (ref 36.0–46.0)
Hemoglobin: 12.7 g/dL (ref 12.0–15.0)
Immature Granulocytes: 0 %
Lymphocytes Relative: 36 %
Lymphs Abs: 1.3 K/uL (ref 0.7–4.0)
MCH: 27.3 pg (ref 26.0–34.0)
MCHC: 33.4 g/dL (ref 30.0–36.0)
MCV: 81.5 fL (ref 80.0–100.0)
Monocytes Absolute: 0.4 K/uL (ref 0.1–1.0)
Monocytes Relative: 10 %
Neutro Abs: 1.9 K/uL (ref 1.7–7.7)
Neutrophils Relative %: 51 %
Platelet Count: 276 K/uL (ref 150–400)
RBC: 4.66 MIL/uL (ref 3.87–5.11)
RDW: 13.2 % (ref 11.5–15.5)
WBC Count: 3.7 K/uL — ABNORMAL LOW (ref 4.0–10.5)
nRBC: 0 % (ref 0.0–0.2)

## 2024-05-16 LAB — IRON AND IRON BINDING CAPACITY (CC-WL,HP ONLY)
Iron: 90 ug/dL (ref 28–170)
Saturation Ratios: 26 % (ref 10.4–31.8)
TIBC: 342 ug/dL (ref 250–450)
UIBC: 252 ug/dL

## 2024-05-16 LAB — FERRITIN: Ferritin: 31 ng/mL (ref 11–307)

## 2024-05-16 NOTE — Progress Notes (Addendum)
 " Hematology and Oncology Follow Up Visit  Brandi Lutz [id number] 1975/11/21 48 y.o. 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 She notes some fatigue at times. She has not been sleeping well due to work and life stress. She states that she has started using a sleep aid recently and this seems to be helping.  No blood loss noted. No abnormal bruising, no petechiae. She has occasional dizziness associated with hypoglycemia and has snacks and drinks on hand for these episodes.   No fever, chills, n/v, cough, rash, SOB, chest pain, palpitations, abdominal pain or changes in bowel or bladder habits.  No swelling, tenderness, numbness or tingling in her extremities.   No falls or syncope reported.  Appetite and hydration are good. Weight is stable at 156 lbs.   ECOG Performance Status: 1 - Symptomatic but completely ambulatory  Medications:  Allergies as of 05/16/2024       Reactions   Tape Rash, Dermatitis        Medication List        Accurate as of May 16, 2024  2:51 PM. If you have any questions, ask your nurse or doctor.          ALPRAZolam  0.5 MG tablet Commonly known as: XANAX  TAKE 1/2-1 TAB TWICE A DAY AS NEEDED FOR ANXIETY   buPROPion  150 MG 24 hr tablet Commonly known as: WELLBUTRIN  XL TAKE 1 TABLET BY MOUTH EVERY DAY   cyanocobalamin  1000 MCG tablet Take 1,000 mcg by mouth daily.   hydrOXYzine  10 MG tablet Commonly known as: ATARAX  TAKE 1-2 TABLETS DAILY AS NEEDED FOR ITCHING   sertraline  50 MG tablet Commonly known as: ZOLOFT  TAKE 1 TABLET BY MOUTH EVERY DAY   traMADol  50 MG tablet Commonly known as: ULTRAM  Take 1-2 tablets (50-100 mg total) by mouth daily as needed.   Wegovy  1.7  MG/0.75ML Soaj SQ injection Generic drug: semaglutide -weight management Inject 1.7 mg into the skin once a week.        Allergies:  Allergies  Allergen Reactions   Tape Rash and Dermatitis    Past Medical History, Surgical history, Social history, and Family History were reviewed and updated.  Review of Systems: All other 10 point review of systems is negative.   Physical Exam:  vitals were not taken for this visit.   Wt Readings from Last 3 Encounters:  02/28/24 156 lb (70.8 kg)  12/19/23 158 lb 3.2 oz (71.8 kg)  11/15/23 158 lb (71.7 kg)    Ocular: Sclerae unicteric, pupils equal, round and reactive to light Ear-nose-throat: Oropharynx clear, dentition fair Lymphatic: No cervical or supraclavicular adenopathy Lungs no rales or rhonchi, good excursion bilaterally Heart regular rate and rhythm, no murmur appreciated Abd soft, nontender, positive bowel sounds MSK no focal spinal tenderness, no joint edema Neuro: non-focal, well-oriented, appropriate affect Breasts: Deferred  Lab Results  Component Value Date   WBC 3.7 (L) 05/16/2024   HGB 12.7 05/16/2024   HCT 38.0 05/16/2024   MCV 81.5 05/16/2024   PLT 276 05/16/2024   Lab Results  Component Value Date   FERRITIN 14 11/15/2023   IRON  43 11/15/2023   TIBC 456 (H) 11/15/2023   UIBC 413 11/15/2023   IRONPCTSAT  9 (L) 11/15/2023   Lab Results  Component Value Date   RETICCTPCT 0.9 05/16/2024   RBC 4.66 05/16/2024   RBC 4.58 05/16/2024   RETICCTABS 31.3 05/02/2015   No results found for: KPAFRELGTCHN, LAMBDASER, KAPLAMBRATIO No results found for: IGGSERUM, IGA, IGMSERUM No results found for: STEPHANY CARLOTA BENSON MARKEL EARLA JOANNIE DOC VICK, SPEI   Chemistry      Component Value Date/Time   NA 140 07/22/2023 1525   NA 141 04/16/2022 1529   K 4.1 07/22/2023 1525   CL 103 07/22/2023 1525   CO2 29 07/22/2023 1525   BUN 6 (L) 07/22/2023 1525   BUN 8  04/16/2022 1529   CREATININE 0.86 07/22/2023 1525      Component Value Date/Time   CALCIUM 9.3 07/22/2023 1525   ALKPHOS 72 01/03/2023 1355   AST 16 07/22/2023 1525   AST 43 (H) 09/16/2020 1343   ALT 15 07/22/2023 1525   ALT 70 (H) 09/16/2020 1343   BILITOT 0.6 07/22/2023 1525   BILITOT 0.3 04/16/2022 1529   BILITOT 0.4 09/16/2020 1343       Impression and Plan: Brandi Lutz is a very pleasant 48 yo African American female with iron  deficiency anemia secondary to malabsorption after gastric bypass.  Iron  studies are pending. We will replace if needed.  Follow-up in 6 months.   Lauraine Pepper, NP 12/3/20252:51 PM  "

## 2024-06-02 ENCOUNTER — Other Ambulatory Visit: Payer: Self-pay | Admitting: Medical

## 2024-06-09 ENCOUNTER — Other Ambulatory Visit: Payer: Self-pay | Admitting: Family

## 2024-06-09 ENCOUNTER — Other Ambulatory Visit: Payer: Self-pay | Admitting: Medical

## 2024-06-11 NOTE — Telephone Encounter (Signed)
 Requesting: alprazolam  0.5mg   Contract: 03/21/24 UDS: 03/21/24 Last Visit: 02/28/24 Next Visit: None Last Refill: 05/16/24 #24 and 0RF   Please Advise

## 2024-07-07 ENCOUNTER — Other Ambulatory Visit: Payer: Self-pay | Admitting: Family

## 2024-07-07 ENCOUNTER — Other Ambulatory Visit: Payer: Self-pay | Admitting: Medical

## 2024-07-09 NOTE — Telephone Encounter (Signed)
 Requesting: alprazolam  0.5mg   Contract: 03/21/24 UDS: 03/21/24 Last Visit: 02/28/24 Next Visit: None Last Refill: 06/11/24 #24 and 0RF   Please Advise

## 2024-07-09 NOTE — Telephone Encounter (Signed)
 Rx refill sent to pharmacy. If you can get her set up for appt 6 months from last controlled med viist.

## 2024-07-16 ENCOUNTER — Ambulatory Visit: Admitting: Medical

## 2024-07-18 ENCOUNTER — Ambulatory Visit: Admitting: Medical

## 2024-07-18 ENCOUNTER — Encounter: Payer: Self-pay | Admitting: Medical

## 2024-07-18 VITALS — BP 130/85 | HR 80 | Temp 98.2°F | Resp 14 | Ht 68.0 in | Wt 160.4 lb

## 2024-07-18 DIAGNOSIS — F3289 Other specified depressive episodes: Secondary | ICD-10-CM

## 2024-07-18 DIAGNOSIS — F419 Anxiety disorder, unspecified: Secondary | ICD-10-CM

## 2024-07-18 DIAGNOSIS — Z79899 Other long term (current) drug therapy: Secondary | ICD-10-CM

## 2024-07-18 DIAGNOSIS — Z7689 Persons encountering health services in other specified circumstances: Secondary | ICD-10-CM

## 2024-07-18 NOTE — Patient Instructions (Addendum)
 Anxiety(high risk med use with xanax ). Pt cautious with med use. Increased anxiety due to work stress and life circumstances. Increased Xanax  usage to 24 tablets per month. No request for medication adjustment. - Continue sertraline  50 mg daily. - Monitor Xanax  usage and send a MyChart message when nearing the last four tablets. - Encouraged taking time off work to manage stress.  Depression. Mood controlled with wellbutrin  . Continues Wellbutrin  for mood stabilization. - Continue Wellbutrin  as prescribed.  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 On Wegovy  1.7 mg weekly. Employer's program requires continued use. No significant weight gain during holidays. - Continue Wegovy  1.7 mg weekly. - Follow up with dietitian in May for program renewal and medication approval. - Monitor weight and report any significant changes.  General health maintenance Blood pressure fluctuating, possibly due to anxiety. Anemia resolved. Colonoscopy due in 2026. - Check blood pressure daily at home, especially during vacation. - Schedule a wellness exam between now and mid-summer. - Follow up with gastroenterologist for colonoscopy scheduling.

## 2024-07-18 NOTE — Progress Notes (Signed)
" ° °  Subjective:    Patient ID: Brandi Lutz, female    DOB: 1976/04/18, 49 y.o.   MRN: [id number]  HPI  Discussed the use of AI scribe software for clinical note transcription with the patient, who gave verbal consent to proceed.  History of Present Illness   Brandi Lutz is a 49 year old female with anxiety and weight management concerns who presents with increased anxiety and medication management.  She reports increased anxiety related to work and life stress and has been using Xanax  daily, sometimes splitting doses. This is the first month she has used all 24 tablets. She takes sertraline  50 mg for anxiety and Wellbutrin  for depression.  She manages weight with Wegovy  1.7 mg weekly, maintaining weight between 150 and 155 pounds without recent gain. She participates in an employer weight management program that requires periodic approval for Wegovy  coverage.  She notes prior anemia with recent labs reported as normal. She has colonoscopies every three years, with the last in 2023 showing an 8 mm distal sigmoid polyp.  She does not monitor blood pressure at home. She feels her  mild higher bp today is  due to stress and anxiety and reports no thyroid disease.  She lives with her aging mother, who helps with cooking. [name] planning a vacation to reduce stress and has arranged time away from work to avoid work-related stress during that period.           Review of Systems  See hpi    Objective:   Physical Exam  General Mental Status- Alert. General Appearance- Not in acute distress.   Skin General: Color- Normal Color. Moisture- Normal Moisture.  Neck Carotid Arteries- Normal color. Moisture- Normal Moisture. No carotid bruits. No JVD.  Chest and Lung Exam Auscultation: Breath Sounds:-Normal.  Cardiovascular Auscultation:Rythm- Regular. Murmurs & Other Heart Sounds:Auscultation of the heart reveals- No Murmurs.  Abdomen Inspection:-Inspeection  Normal. Palpation/Percussion:Note:No mass. Palpation and Percussion of the abdomen reveal- Non Tender, Non Distended + BS, no rebound or guarding.    Neurologic Cranial Nerve exam:- CN III-XII intact(No nystagmus), symmetric smile. Drift Test:- No drift. Romberg Exam:- Negative.  Heal to Toe Gait exam:-Normal. Finger to Nose:- Normal/Intact Strength:- 5/5 equal and symmetric strength both upper and lower extremities.        Assessment & Plan:   Anxiety(high risk med use with xanax ). Pt cautious with med use. Increased anxiety due to work stress and life circumstances. Increased Xanax  usage to 24 tablets per month. No request for medication adjustment. - Continue sertraline  50 mg daily. - Monitor Xanax  usage and send a MyChart message when nearing the last four tablets. - Encouraged taking time off work to manage stress.  Depression. Mood controlled with wellbutrin  . Continues Wellbutrin  for mood stabilization. - Continue Wellbutrin  as prescribed.  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 On Wegovy  1.7 mg weekly. Employer's program requires continued use. No significant weight gain during holidays. - Continue Wegovy  1.7 mg weekly. - Follow up with dietitian in May for program renewal and medication approval. - Monitor weight and report any significant changes.  General health maintenance Blood pressure fluctuating, possibly due to anxiety. Anemia resolved. Colonoscopy due in 2026. - Check blood pressure daily at home, especially during vacation. - Schedule a wellness exam between now and mid-summer. - Follow up with gastroenterologist for colonoscopy scheduling.  Dallas Maxwell, PA-C  "

## 2024-11-14 ENCOUNTER — Inpatient Hospital Stay: Admitting: Family

## 2024-11-14 ENCOUNTER — Inpatient Hospital Stay
